# Patient Record
Sex: Female | Born: 1935 | Race: Black or African American | Hispanic: No | Smoking: Never smoker
Health system: Southern US, Community
[De-identification: ages and names within clinical notes are randomized; demographics above are authoritative.]

## PROBLEM LIST (undated history)

## (undated) DIAGNOSIS — I10 Essential (primary) hypertension: Secondary | ICD-10-CM

## (undated) DIAGNOSIS — G4733 Obstructive sleep apnea (adult) (pediatric): Secondary | ICD-10-CM

## (undated) DIAGNOSIS — J411 Mucopurulent chronic bronchitis: Secondary | ICD-10-CM

## (undated) DIAGNOSIS — M8668 Other chronic osteomyelitis, other site: Secondary | ICD-10-CM

## (undated) DIAGNOSIS — D649 Anemia, unspecified: Secondary | ICD-10-CM

## (undated) DIAGNOSIS — F32A Depression, unspecified: Secondary | ICD-10-CM

## (undated) DIAGNOSIS — M545 Low back pain, unspecified: Secondary | ICD-10-CM

## (undated) DIAGNOSIS — F329 Major depressive disorder, single episode, unspecified: Secondary | ICD-10-CM

## (undated) DIAGNOSIS — I499 Cardiac arrhythmia, unspecified: Secondary | ICD-10-CM

## (undated) DIAGNOSIS — E041 Nontoxic single thyroid nodule: Secondary | ICD-10-CM

## (undated) DIAGNOSIS — Z86010 Personal history of colonic polyps: Secondary | ICD-10-CM

## (undated) DIAGNOSIS — Z8719 Personal history of other diseases of the digestive system: Secondary | ICD-10-CM

## (undated) DIAGNOSIS — R002 Palpitations: Secondary | ICD-10-CM

## (undated) DIAGNOSIS — K449 Diaphragmatic hernia without obstruction or gangrene: Secondary | ICD-10-CM

## (undated) DIAGNOSIS — F419 Anxiety disorder, unspecified: Secondary | ICD-10-CM

## (undated) DIAGNOSIS — K5730 Diverticulosis of large intestine without perforation or abscess without bleeding: Secondary | ICD-10-CM

## (undated) DIAGNOSIS — R269 Unspecified abnormalities of gait and mobility: Secondary | ICD-10-CM

## (undated) DIAGNOSIS — E7800 Pure hypercholesterolemia, unspecified: Secondary | ICD-10-CM

## (undated) DIAGNOSIS — I2510 Atherosclerotic heart disease of native coronary artery without angina pectoris: Secondary | ICD-10-CM

## (undated) DIAGNOSIS — M1990 Unspecified osteoarthritis, unspecified site: Secondary | ICD-10-CM

## (undated) DIAGNOSIS — K219 Gastro-esophageal reflux disease without esophagitis: Secondary | ICD-10-CM

## (undated) HISTORY — DX: Anemia, unspecified: D64.9

## (undated) HISTORY — PX: CHOLECYSTECTOMY: SHX55

## (undated) HISTORY — DX: Gastro-esophageal reflux disease without esophagitis: K21.9

## (undated) HISTORY — DX: Unspecified osteoarthritis, unspecified site: M19.90

## (undated) HISTORY — DX: Diaphragmatic hernia without obstruction or gangrene: K44.9

## (undated) HISTORY — PX: CATARACT EXTRACTION: SUR2

## (undated) HISTORY — DX: Personal history of colonic polyps: Z86.010

## (undated) HISTORY — DX: Palpitations: R00.2

## (undated) HISTORY — PX: ABDOMINAL HYSTERECTOMY: SHX81

## (undated) HISTORY — DX: Atherosclerotic heart disease of native coronary artery without angina pectoris: I25.10

## (undated) HISTORY — DX: Nontoxic single thyroid nodule: E04.1

## (undated) HISTORY — DX: Obstructive sleep apnea (adult) (pediatric): G47.33

## (undated) HISTORY — DX: Low back pain: M54.5

## (undated) HISTORY — DX: Diverticulosis of large intestine without perforation or abscess without bleeding: K57.30

## (undated) HISTORY — DX: Pure hypercholesterolemia, unspecified: E78.00

## (undated) HISTORY — DX: Unspecified abnormalities of gait and mobility: R26.9

## (undated) HISTORY — DX: Mucopurulent chronic bronchitis: J41.1

## (undated) HISTORY — DX: Essential (primary) hypertension: I10

## (undated) HISTORY — DX: Other chronic osteomyelitis, other site: M86.68

## (undated) HISTORY — PX: TOTAL KNEE ARTHROPLASTY: SHX125

## (undated) HISTORY — DX: Low back pain, unspecified: M54.50

## (undated) HISTORY — DX: Personal history of other diseases of the digestive system: Z87.19

## (undated) HISTORY — DX: Anxiety disorder, unspecified: F41.9

## (undated) HISTORY — PX: APPENDECTOMY: SHX54

---

## 1998-03-20 ENCOUNTER — Ambulatory Visit (HOSPITAL_COMMUNITY): Admission: RE | Admit: 1998-03-20 | Discharge: 1998-03-20 | Payer: Self-pay | Admitting: Obstetrics & Gynecology

## 1998-03-20 ENCOUNTER — Encounter: Payer: Self-pay | Admitting: Obstetrics & Gynecology

## 1998-09-27 ENCOUNTER — Encounter: Payer: Self-pay | Admitting: Pulmonary Disease

## 1998-09-27 ENCOUNTER — Ambulatory Visit (HOSPITAL_COMMUNITY): Admission: RE | Admit: 1998-09-27 | Discharge: 1998-09-27 | Payer: Self-pay | Admitting: Pulmonary Disease

## 1998-12-20 ENCOUNTER — Ambulatory Visit (HOSPITAL_COMMUNITY): Admission: RE | Admit: 1998-12-20 | Discharge: 1998-12-20 | Payer: Self-pay | Admitting: Gastroenterology

## 1998-12-20 ENCOUNTER — Encounter: Payer: Self-pay | Admitting: Gastroenterology

## 1999-01-05 ENCOUNTER — Ambulatory Visit (HOSPITAL_COMMUNITY): Admission: RE | Admit: 1999-01-05 | Discharge: 1999-01-05 | Payer: Self-pay

## 1999-01-23 ENCOUNTER — Encounter (INDEPENDENT_AMBULATORY_CARE_PROVIDER_SITE_OTHER): Payer: Self-pay

## 1999-01-23 ENCOUNTER — Observation Stay (HOSPITAL_COMMUNITY): Admission: RE | Admit: 1999-01-23 | Discharge: 1999-01-24 | Payer: Self-pay

## 1999-03-20 ENCOUNTER — Ambulatory Visit (HOSPITAL_COMMUNITY): Admission: RE | Admit: 1999-03-20 | Discharge: 1999-03-20 | Payer: Self-pay

## 2000-03-24 ENCOUNTER — Ambulatory Visit (HOSPITAL_COMMUNITY): Admission: RE | Admit: 2000-03-24 | Discharge: 2000-03-24 | Payer: Self-pay | Admitting: Obstetrics & Gynecology

## 2000-04-18 ENCOUNTER — Other Ambulatory Visit: Admission: RE | Admit: 2000-04-18 | Discharge: 2000-04-18 | Payer: Self-pay | Admitting: Obstetrics & Gynecology

## 2000-07-23 ENCOUNTER — Encounter: Payer: Self-pay | Admitting: Podiatry

## 2000-07-23 ENCOUNTER — Encounter: Admission: RE | Admit: 2000-07-23 | Discharge: 2000-07-23 | Payer: Self-pay | Admitting: Podiatry

## 2000-07-25 ENCOUNTER — Ambulatory Visit (HOSPITAL_BASED_OUTPATIENT_CLINIC_OR_DEPARTMENT_OTHER): Admission: RE | Admit: 2000-07-25 | Discharge: 2000-07-25 | Payer: Self-pay | Admitting: Podiatry

## 2001-02-23 ENCOUNTER — Encounter: Payer: Self-pay | Admitting: *Deleted

## 2001-02-24 ENCOUNTER — Inpatient Hospital Stay (HOSPITAL_COMMUNITY): Admission: RE | Admit: 2001-02-24 | Discharge: 2001-02-26 | Payer: Self-pay | Admitting: *Deleted

## 2001-02-26 ENCOUNTER — Encounter: Payer: Self-pay | Admitting: Physical Medicine & Rehabilitation

## 2001-02-26 ENCOUNTER — Inpatient Hospital Stay (HOSPITAL_COMMUNITY)
Admission: RE | Admit: 2001-02-26 | Discharge: 2001-03-04 | Payer: Self-pay | Admitting: Physical Medicine & Rehabilitation

## 2001-03-27 ENCOUNTER — Encounter: Admission: RE | Admit: 2001-03-27 | Discharge: 2001-06-25 | Payer: Self-pay | Admitting: *Deleted

## 2001-07-08 ENCOUNTER — Encounter: Payer: Self-pay | Admitting: Gastroenterology

## 2001-08-24 ENCOUNTER — Encounter: Payer: Self-pay | Admitting: Obstetrics & Gynecology

## 2001-08-24 ENCOUNTER — Ambulatory Visit (HOSPITAL_COMMUNITY): Admission: RE | Admit: 2001-08-24 | Discharge: 2001-08-24 | Payer: Self-pay | Admitting: Obstetrics & Gynecology

## 2002-04-30 ENCOUNTER — Encounter: Admission: RE | Admit: 2002-04-30 | Discharge: 2002-04-30 | Payer: Self-pay | Admitting: *Deleted

## 2002-04-30 ENCOUNTER — Encounter: Payer: Self-pay | Admitting: *Deleted

## 2002-05-14 ENCOUNTER — Encounter: Payer: Self-pay | Admitting: *Deleted

## 2002-05-14 ENCOUNTER — Encounter: Admission: RE | Admit: 2002-05-14 | Discharge: 2002-05-14 | Payer: Self-pay | Admitting: *Deleted

## 2002-07-09 ENCOUNTER — Encounter: Payer: Self-pay | Admitting: Cardiology

## 2002-07-09 ENCOUNTER — Ambulatory Visit (HOSPITAL_COMMUNITY): Admission: RE | Admit: 2002-07-09 | Discharge: 2002-07-09 | Payer: Self-pay | Admitting: Cardiology

## 2002-09-23 ENCOUNTER — Inpatient Hospital Stay (HOSPITAL_COMMUNITY): Admission: EM | Admit: 2002-09-23 | Discharge: 2002-09-25 | Payer: Self-pay | Admitting: Emergency Medicine

## 2002-09-23 ENCOUNTER — Encounter: Payer: Self-pay | Admitting: *Deleted

## 2002-10-21 ENCOUNTER — Encounter: Admission: RE | Admit: 2002-10-21 | Discharge: 2003-01-19 | Payer: Self-pay | Admitting: Neurology

## 2003-01-05 ENCOUNTER — Ambulatory Visit (HOSPITAL_BASED_OUTPATIENT_CLINIC_OR_DEPARTMENT_OTHER): Admission: RE | Admit: 2003-01-05 | Discharge: 2003-01-05 | Payer: Self-pay | Admitting: Pulmonary Disease

## 2003-04-05 ENCOUNTER — Ambulatory Visit (HOSPITAL_COMMUNITY): Admission: RE | Admit: 2003-04-05 | Discharge: 2003-04-05 | Payer: Self-pay | Admitting: Cardiovascular Disease

## 2003-04-12 ENCOUNTER — Ambulatory Visit (HOSPITAL_COMMUNITY): Admission: RE | Admit: 2003-04-12 | Discharge: 2003-04-12 | Payer: Self-pay | Admitting: Pulmonary Disease

## 2003-04-12 ENCOUNTER — Encounter: Payer: Self-pay | Admitting: Pulmonary Disease

## 2003-04-22 ENCOUNTER — Ambulatory Visit (HOSPITAL_BASED_OUTPATIENT_CLINIC_OR_DEPARTMENT_OTHER): Admission: RE | Admit: 2003-04-22 | Discharge: 2003-04-22 | Payer: Self-pay | Admitting: Pulmonary Disease

## 2003-05-13 ENCOUNTER — Encounter (INDEPENDENT_AMBULATORY_CARE_PROVIDER_SITE_OTHER): Payer: Self-pay | Admitting: *Deleted

## 2003-05-13 ENCOUNTER — Encounter: Payer: Self-pay | Admitting: Endocrinology

## 2003-05-13 ENCOUNTER — Ambulatory Visit (HOSPITAL_COMMUNITY): Admission: RE | Admit: 2003-05-13 | Discharge: 2003-05-13 | Payer: Self-pay | Admitting: Endocrinology

## 2003-07-19 ENCOUNTER — Inpatient Hospital Stay (HOSPITAL_COMMUNITY): Admission: RE | Admit: 2003-07-19 | Discharge: 2003-07-22 | Payer: Self-pay | Admitting: Orthopedic Surgery

## 2003-07-22 ENCOUNTER — Inpatient Hospital Stay (HOSPITAL_COMMUNITY)
Admission: RE | Admit: 2003-07-22 | Discharge: 2003-07-29 | Payer: Self-pay | Admitting: Physical Medicine & Rehabilitation

## 2003-09-02 ENCOUNTER — Encounter: Admission: RE | Admit: 2003-09-02 | Discharge: 2003-10-28 | Payer: Self-pay | Admitting: Orthopedic Surgery

## 2003-10-24 ENCOUNTER — Other Ambulatory Visit: Admission: RE | Admit: 2003-10-24 | Discharge: 2003-10-24 | Payer: Self-pay | Admitting: Obstetrics and Gynecology

## 2003-12-13 ENCOUNTER — Ambulatory Visit (HOSPITAL_COMMUNITY): Admission: RE | Admit: 2003-12-13 | Discharge: 2003-12-13 | Payer: Self-pay | Admitting: Pulmonary Disease

## 2003-12-27 ENCOUNTER — Inpatient Hospital Stay (HOSPITAL_COMMUNITY): Admission: EM | Admit: 2003-12-27 | Discharge: 2003-12-29 | Payer: Self-pay | Admitting: Emergency Medicine

## 2003-12-28 ENCOUNTER — Encounter (INDEPENDENT_AMBULATORY_CARE_PROVIDER_SITE_OTHER): Payer: Self-pay | Admitting: Specialist

## 2003-12-28 ENCOUNTER — Encounter: Payer: Self-pay | Admitting: Internal Medicine

## 2004-07-18 ENCOUNTER — Ambulatory Visit: Payer: Self-pay | Admitting: Pulmonary Disease

## 2004-07-20 ENCOUNTER — Ambulatory Visit: Payer: Self-pay | Admitting: Pulmonary Disease

## 2004-08-22 ENCOUNTER — Ambulatory Visit: Payer: Self-pay

## 2004-08-27 ENCOUNTER — Ambulatory Visit: Payer: Self-pay | Admitting: Cardiovascular Disease

## 2004-08-30 ENCOUNTER — Ambulatory Visit: Payer: Self-pay | Admitting: Cardiology

## 2004-08-30 ENCOUNTER — Inpatient Hospital Stay (HOSPITAL_BASED_OUTPATIENT_CLINIC_OR_DEPARTMENT_OTHER): Admission: RE | Admit: 2004-08-30 | Discharge: 2004-08-30 | Payer: Self-pay | Admitting: Cardiology

## 2004-08-31 ENCOUNTER — Ambulatory Visit: Payer: Self-pay

## 2004-09-03 ENCOUNTER — Ambulatory Visit: Payer: Self-pay

## 2004-09-20 ENCOUNTER — Ambulatory Visit: Payer: Self-pay | Admitting: Cardiology

## 2004-09-25 ENCOUNTER — Ambulatory Visit: Payer: Self-pay | Admitting: Internal Medicine

## 2004-09-25 ENCOUNTER — Inpatient Hospital Stay (HOSPITAL_COMMUNITY): Admission: EM | Admit: 2004-09-25 | Discharge: 2004-09-30 | Payer: Self-pay | Admitting: Emergency Medicine

## 2004-09-25 ENCOUNTER — Ambulatory Visit: Payer: Self-pay | Admitting: Pulmonary Disease

## 2004-10-05 ENCOUNTER — Ambulatory Visit: Payer: Self-pay | Admitting: Pulmonary Disease

## 2004-10-23 ENCOUNTER — Ambulatory Visit: Payer: Self-pay | Admitting: Gastroenterology

## 2004-11-02 ENCOUNTER — Ambulatory Visit: Payer: Self-pay | Admitting: Cardiology

## 2004-11-05 ENCOUNTER — Ambulatory Visit: Payer: Self-pay | Admitting: Pulmonary Disease

## 2004-11-06 ENCOUNTER — Ambulatory Visit: Payer: Self-pay | Admitting: Pulmonary Disease

## 2005-01-01 ENCOUNTER — Ambulatory Visit (HOSPITAL_COMMUNITY): Admission: RE | Admit: 2005-01-01 | Discharge: 2005-01-01 | Payer: Self-pay | Admitting: Podiatry

## 2005-02-01 ENCOUNTER — Ambulatory Visit: Payer: Self-pay | Admitting: Pulmonary Disease

## 2005-02-06 ENCOUNTER — Ambulatory Visit: Payer: Self-pay | Admitting: Pulmonary Disease

## 2005-03-11 ENCOUNTER — Ambulatory Visit: Payer: Self-pay | Admitting: Physician Assistant

## 2005-03-11 ENCOUNTER — Ambulatory Visit: Payer: Self-pay

## 2005-04-22 ENCOUNTER — Ambulatory Visit: Payer: Self-pay | Admitting: Pulmonary Disease

## 2005-05-21 ENCOUNTER — Ambulatory Visit: Payer: Self-pay | Admitting: Cardiovascular Disease

## 2005-08-20 ENCOUNTER — Ambulatory Visit: Payer: Self-pay | Admitting: Pulmonary Disease

## 2005-09-03 ENCOUNTER — Ambulatory Visit: Payer: Self-pay | Admitting: Gastroenterology

## 2005-11-20 ENCOUNTER — Ambulatory Visit: Payer: Self-pay | Admitting: Pulmonary Disease

## 2005-11-27 ENCOUNTER — Ambulatory Visit: Payer: Self-pay | Admitting: Pulmonary Disease

## 2006-03-19 ENCOUNTER — Ambulatory Visit: Payer: Self-pay | Admitting: Pulmonary Disease

## 2006-04-11 ENCOUNTER — Ambulatory Visit: Payer: Self-pay | Admitting: Cardiovascular Disease

## 2006-04-21 ENCOUNTER — Ambulatory Visit: Payer: Self-pay

## 2006-09-08 ENCOUNTER — Ambulatory Visit: Payer: Self-pay | Admitting: Pulmonary Disease

## 2007-03-10 ENCOUNTER — Ambulatory Visit: Payer: Self-pay | Admitting: Pulmonary Disease

## 2007-03-10 LAB — CONVERTED CEMR LAB
Albumin: 3.6 g/dL (ref 3.5–5.2)
BUN: 12 mg/dL (ref 6–23)
Bilirubin, Direct: 0.3 mg/dL (ref 0.0–0.3)
Calcium: 9.7 mg/dL (ref 8.4–10.5)
Eosinophils Absolute: 0.1 10*3/uL (ref 0.0–0.6)
GFR calc Af Amer: 78 mL/min
GFR calc non Af Amer: 65 mL/min
Glucose, Bld: 106 mg/dL — ABNORMAL HIGH (ref 70–99)
HDL: 64.8 mg/dL (ref >39.0)
LDL Cholesterol: 94 mg/dL (ref 0–99)
Lymphocytes Relative: 20.8 % (ref 12.0–46.0)
MCV: 89.9 fL (ref 78.0–100.0)
Monocytes Relative: 8 % (ref 3.0–11.0)
Neutro Abs: 3.9 10*3/uL (ref 1.4–7.7)
Platelets: 219 10*3/uL (ref 150–400)
TSH: 0.87 microintl units/mL (ref 0.35–5.50)
VLDL: 15 mg/dL (ref 0–40)

## 2007-05-01 ENCOUNTER — Ambulatory Visit: Payer: Self-pay | Admitting: Cardiovascular Disease

## 2007-09-01 DIAGNOSIS — I2510 Atherosclerotic heart disease of native coronary artery without angina pectoris: Secondary | ICD-10-CM | POA: Insufficient documentation

## 2007-09-01 DIAGNOSIS — E785 Hyperlipidemia, unspecified: Secondary | ICD-10-CM | POA: Insufficient documentation

## 2007-09-01 DIAGNOSIS — G4733 Obstructive sleep apnea (adult) (pediatric): Secondary | ICD-10-CM | POA: Insufficient documentation

## 2007-09-01 DIAGNOSIS — I10 Essential (primary) hypertension: Secondary | ICD-10-CM

## 2007-09-01 DIAGNOSIS — D649 Anemia, unspecified: Secondary | ICD-10-CM | POA: Insufficient documentation

## 2007-09-01 DIAGNOSIS — K449 Diaphragmatic hernia without obstruction or gangrene: Secondary | ICD-10-CM | POA: Insufficient documentation

## 2007-09-02 ENCOUNTER — Ambulatory Visit: Payer: Self-pay | Admitting: Pulmonary Disease

## 2007-09-02 DIAGNOSIS — K5730 Diverticulosis of large intestine without perforation or abscess without bleeding: Secondary | ICD-10-CM

## 2007-09-02 DIAGNOSIS — M1990 Unspecified osteoarthritis, unspecified site: Secondary | ICD-10-CM | POA: Insufficient documentation

## 2007-09-02 DIAGNOSIS — R269 Unspecified abnormalities of gait and mobility: Secondary | ICD-10-CM | POA: Insufficient documentation

## 2007-09-02 DIAGNOSIS — E041 Nontoxic single thyroid nodule: Secondary | ICD-10-CM | POA: Insufficient documentation

## 2007-09-02 DIAGNOSIS — J42 Unspecified chronic bronchitis: Secondary | ICD-10-CM

## 2007-09-02 DIAGNOSIS — M5442 Lumbago with sciatica, left side: Secondary | ICD-10-CM

## 2007-09-14 ENCOUNTER — Telehealth: Payer: Self-pay | Admitting: Pulmonary Disease

## 2007-10-21 ENCOUNTER — Ambulatory Visit: Payer: Self-pay | Admitting: Gastroenterology

## 2007-11-25 ENCOUNTER — Encounter: Payer: Self-pay | Admitting: Infectious Diseases

## 2007-11-26 ENCOUNTER — Encounter: Payer: Self-pay | Admitting: Infectious Diseases

## 2007-12-09 ENCOUNTER — Encounter: Payer: Self-pay | Admitting: Infectious Diseases

## 2007-12-11 ENCOUNTER — Ambulatory Visit: Payer: Self-pay | Admitting: Infectious Diseases

## 2007-12-11 DIAGNOSIS — M8668 Other chronic osteomyelitis, other site: Secondary | ICD-10-CM

## 2008-02-09 ENCOUNTER — Inpatient Hospital Stay (HOSPITAL_COMMUNITY): Admission: EM | Admit: 2008-02-09 | Discharge: 2008-02-11 | Payer: Self-pay | Admitting: Emergency Medicine

## 2008-02-09 ENCOUNTER — Telehealth: Payer: Self-pay | Admitting: Gastroenterology

## 2008-02-11 ENCOUNTER — Ambulatory Visit: Payer: Self-pay | Admitting: Gastroenterology

## 2008-02-11 ENCOUNTER — Encounter: Payer: Self-pay | Admitting: Gastroenterology

## 2008-02-12 ENCOUNTER — Encounter: Payer: Self-pay | Admitting: Gastroenterology

## 2008-02-25 ENCOUNTER — Ambulatory Visit: Payer: Self-pay | Admitting: Infectious Diseases

## 2008-03-01 ENCOUNTER — Ambulatory Visit: Payer: Self-pay | Admitting: Pulmonary Disease

## 2008-03-01 DIAGNOSIS — F411 Generalized anxiety disorder: Secondary | ICD-10-CM | POA: Insufficient documentation

## 2008-03-03 LAB — CONVERTED CEMR LAB
ALT: 15 units/L (ref 0–35)
AST: 23 units/L (ref 0–37)
Albumin: 3.7 g/dL (ref 3.5–5.2)
Alkaline Phosphatase: 62 units/L (ref 39–117)
BUN: 11 mg/dL (ref 6–23)
Basophils Relative: 0.8 % (ref 0.0–3.0)
CO2: 34 meq/L — ABNORMAL HIGH (ref 19–32)
Chloride: 103 meq/L (ref 96–112)
Creatinine, Ser: 1.1 mg/dL (ref 0.4–1.2)
Eosinophils Relative: 0.9 % (ref 0.0–5.0)
GFR calc non Af Amer: 51 mL/min
Hemoglobin: 12.7 g/dL (ref 12.0–15.0)
LDL Cholesterol: 101 mg/dL — ABNORMAL HIGH (ref 0–99)
Lymphocytes Relative: 21.9 % (ref 12.0–46.0)
Monocytes Relative: 12.3 % — ABNORMAL HIGH (ref 3.0–12.0)
Neutro Abs: 3.7 10*3/uL (ref 1.4–7.7)
Potassium: 3.3 meq/L — ABNORMAL LOW (ref 3.5–5.1)
RBC: 4.13 M/uL (ref 3.87–5.11)
RDW: 13.1 % (ref 11.5–14.6)
Sed Rate: 14 mm/hr (ref 0–22)
Sodium: 142 meq/L (ref 135–145)
Total Bilirubin: 1.2 mg/dL (ref 0.3–1.2)
Total CHOL/HDL Ratio: 3
WBC: 5.7 10*3/uL (ref 4.5–10.5)

## 2008-03-09 DIAGNOSIS — Z8719 Personal history of other diseases of the digestive system: Secondary | ICD-10-CM | POA: Insufficient documentation

## 2008-03-09 DIAGNOSIS — H409 Unspecified glaucoma: Secondary | ICD-10-CM | POA: Insufficient documentation

## 2008-03-09 DIAGNOSIS — Z860100 Personal history of colon polyps, unspecified: Secondary | ICD-10-CM | POA: Insufficient documentation

## 2008-03-10 ENCOUNTER — Ambulatory Visit: Payer: Self-pay | Admitting: Gastroenterology

## 2008-03-10 DIAGNOSIS — K219 Gastro-esophageal reflux disease without esophagitis: Secondary | ICD-10-CM

## 2008-04-20 ENCOUNTER — Ambulatory Visit: Payer: Self-pay | Admitting: Cardiovascular Disease

## 2008-05-23 ENCOUNTER — Encounter: Payer: Self-pay | Admitting: Pulmonary Disease

## 2008-06-07 ENCOUNTER — Ambulatory Visit: Payer: Self-pay | Admitting: Infectious Diseases

## 2008-08-08 ENCOUNTER — Encounter: Payer: Self-pay | Admitting: Gastroenterology

## 2008-08-29 ENCOUNTER — Ambulatory Visit: Payer: Self-pay | Admitting: Pulmonary Disease

## 2008-08-29 DIAGNOSIS — E559 Vitamin D deficiency, unspecified: Secondary | ICD-10-CM

## 2008-10-28 ENCOUNTER — Encounter (INDEPENDENT_AMBULATORY_CARE_PROVIDER_SITE_OTHER): Payer: Self-pay | Admitting: *Deleted

## 2008-11-08 ENCOUNTER — Ambulatory Visit: Payer: Self-pay | Admitting: Infectious Diseases

## 2009-02-27 ENCOUNTER — Ambulatory Visit: Payer: Self-pay | Admitting: Pulmonary Disease

## 2009-02-27 ENCOUNTER — Encounter: Payer: Self-pay | Admitting: Adult Health

## 2009-02-27 DIAGNOSIS — H9190 Unspecified hearing loss, unspecified ear: Secondary | ICD-10-CM | POA: Insufficient documentation

## 2009-03-17 LAB — CONVERTED CEMR LAB: Vit D, 25-Hydroxy: 33 ng/mL (ref 30–89)

## 2009-03-27 ENCOUNTER — Encounter: Payer: Self-pay | Admitting: Pulmonary Disease

## 2009-04-14 ENCOUNTER — Ambulatory Visit: Payer: Self-pay | Admitting: Cardiovascular Disease

## 2009-04-14 DIAGNOSIS — R002 Palpitations: Secondary | ICD-10-CM | POA: Insufficient documentation

## 2009-04-18 ENCOUNTER — Ambulatory Visit: Payer: Self-pay | Admitting: Cardiovascular Disease

## 2009-04-20 ENCOUNTER — Telehealth: Payer: Self-pay | Admitting: Cardiovascular Disease

## 2009-05-23 ENCOUNTER — Telehealth (INDEPENDENT_AMBULATORY_CARE_PROVIDER_SITE_OTHER): Payer: Self-pay | Admitting: *Deleted

## 2009-05-28 LAB — CONVERTED CEMR LAB
Alkaline Phosphatase: 62 units/L (ref 39–117)
BUN: 19 mg/dL (ref 6–23)
Basophils Absolute: 0 10*3/uL (ref 0.0–0.1)
Bilirubin, Direct: 0.2 mg/dL (ref 0.0–0.3)
CO2: 31 meq/L (ref 19–32)
Calcium: 9.8 mg/dL (ref 8.4–10.5)
Chloride: 103 meq/L (ref 96–112)
Cholesterol: 174 mg/dL (ref 0–200)
Creatinine, Ser: 1.1 mg/dL (ref 0.4–1.2)
Eosinophils Absolute: 0.1 10*3/uL (ref 0.0–0.7)
HDL: 56.7 mg/dL (ref >39.00)
Lymphocytes Relative: 22.3 % (ref 12.0–46.0)
MCHC: 33.1 g/dL (ref 30.0–36.0)
MCV: 88.4 fL (ref 78.0–100.0)
Monocytes Absolute: 0.4 10*3/uL (ref 0.1–1.0)
Neutrophils Relative %: 69.5 % (ref 43.0–77.0)
Platelets: 218 10*3/uL (ref 150.0–400.0)
RBC: 4.51 M/uL (ref 3.87–5.11)
RDW: 14 % (ref 11.5–14.6)
Total Bilirubin: 1.6 mg/dL — ABNORMAL HIGH (ref 0.3–1.2)
Total CHOL/HDL Ratio: 3
Total Protein: 7.4 g/dL (ref 6.0–8.3)
Triglycerides: 94 mg/dL (ref 0.0–149.0)

## 2009-06-06 ENCOUNTER — Encounter: Payer: Self-pay | Admitting: Adult Health

## 2009-06-06 ENCOUNTER — Encounter: Payer: Self-pay | Admitting: Cardiovascular Disease

## 2009-06-06 ENCOUNTER — Ambulatory Visit: Payer: Self-pay | Admitting: Pulmonary Disease

## 2009-06-20 ENCOUNTER — Ambulatory Visit: Payer: Self-pay | Admitting: Cardiovascular Disease

## 2009-08-29 ENCOUNTER — Ambulatory Visit: Payer: Self-pay | Admitting: Pulmonary Disease

## 2009-09-01 LAB — CONVERTED CEMR LAB
AST: 32 units/L (ref 0–37)
Albumin: 3.9 g/dL (ref 3.5–5.2)
Alkaline Phosphatase: 70 units/L (ref 39–117)
Basophils Absolute: 0.1 10*3/uL (ref 0.0–0.1)
Bilirubin, Direct: 0.1 mg/dL (ref 0.0–0.3)
CO2: 31 meq/L (ref 19–32)
Calcium: 10.1 mg/dL (ref 8.4–10.5)
Chloride: 103 meq/L (ref 96–112)
Eosinophils Absolute: 0.1 10*3/uL (ref 0.0–0.7)
Glucose, Bld: 90 mg/dL (ref 70–99)
HCT: 44 % (ref 36.0–46.0)
Hemoglobin: 13.9 g/dL (ref 12.0–15.0)
Lymphocytes Relative: 26.9 % (ref 12.0–46.0)
Lymphs Abs: 1.9 10*3/uL (ref 0.7–4.0)
MCHC: 31.6 g/dL (ref 30.0–36.0)
Neutro Abs: 4 10*3/uL (ref 1.4–7.7)
Platelets: 257 10*3/uL (ref 150.0–400.0)
Potassium: 3.1 meq/L — ABNORMAL LOW (ref 3.5–5.1)
RDW: 14.3 % (ref 11.5–14.6)
Sodium: 141 meq/L (ref 135–145)
TSH: 1.06 microintl units/mL (ref 0.35–5.50)
Total Protein: 7.8 g/dL (ref 6.0–8.3)

## 2009-10-24 ENCOUNTER — Telehealth (INDEPENDENT_AMBULATORY_CARE_PROVIDER_SITE_OTHER): Payer: Self-pay | Admitting: *Deleted

## 2010-02-09 ENCOUNTER — Ambulatory Visit: Payer: Self-pay | Admitting: Pulmonary Disease

## 2010-02-16 ENCOUNTER — Ambulatory Visit: Payer: Self-pay | Admitting: Pulmonary Disease

## 2010-02-20 ENCOUNTER — Telehealth: Payer: Self-pay | Admitting: Pulmonary Disease

## 2010-02-20 LAB — CONVERTED CEMR LAB
Calcium: 9.7 mg/dL (ref 8.4–10.5)
Creatinine, Ser: 0.9 mg/dL (ref 0.4–1.2)
GFR calc non Af Amer: 77.42 mL/min (ref >60)
HDL: 57.5 mg/dL (ref >39.00)
LDL Cholesterol: 91 mg/dL (ref 0–99)
Sodium: 142 meq/L (ref 135–145)
Total CHOL/HDL Ratio: 3
Triglycerides: 90 mg/dL (ref 0.0–149.0)

## 2010-02-21 ENCOUNTER — Telehealth (INDEPENDENT_AMBULATORY_CARE_PROVIDER_SITE_OTHER): Payer: Self-pay | Admitting: *Deleted

## 2010-05-23 ENCOUNTER — Telehealth: Payer: Self-pay | Admitting: Pulmonary Disease

## 2010-07-02 ENCOUNTER — Ambulatory Visit: Payer: Self-pay | Admitting: Gastroenterology

## 2010-07-03 ENCOUNTER — Telehealth: Payer: Self-pay | Admitting: Gastroenterology

## 2010-07-09 ENCOUNTER — Telehealth: Payer: Self-pay | Admitting: Gastroenterology

## 2010-08-16 ENCOUNTER — Encounter: Payer: Self-pay | Admitting: Pulmonary Disease

## 2010-08-16 ENCOUNTER — Other Ambulatory Visit: Payer: Self-pay | Admitting: Pulmonary Disease

## 2010-08-16 ENCOUNTER — Ambulatory Visit
Admission: RE | Admit: 2010-08-16 | Discharge: 2010-08-16 | Payer: Self-pay | Source: Home / Self Care | Attending: Pulmonary Disease | Admitting: Pulmonary Disease

## 2010-08-16 LAB — HEPATIC FUNCTION PANEL
ALT: 16 U/L (ref 0–35)
AST: 29 U/L (ref 0–37)
Albumin: 3.8 g/dL (ref 3.5–5.2)
Alkaline Phosphatase: 60 U/L (ref 39–117)
Bilirubin, Direct: 0.2 mg/dL (ref 0.0–0.3)
Total Bilirubin: 1.2 mg/dL (ref 0.3–1.2)
Total Protein: 7.1 g/dL (ref 6.0–8.3)

## 2010-08-16 LAB — BASIC METABOLIC PANEL
BUN: 14 mg/dL (ref 6–23)
CO2: 34 mEq/L — ABNORMAL HIGH (ref 19–32)
Calcium: 10.1 mg/dL (ref 8.4–10.5)
Chloride: 103 mEq/L (ref 96–112)
Creatinine, Ser: 0.8 mg/dL (ref 0.4–1.2)
GFR: 88.58 mL/min (ref >60.00)
Glucose, Bld: 94 mg/dL (ref 70–99)
Potassium: 3.7 mEq/L (ref 3.5–5.1)
Sodium: 142 mEq/L (ref 135–145)

## 2010-08-16 LAB — CBC WITH DIFFERENTIAL/PLATELET
Basophils Absolute: 0 10*3/uL (ref 0.0–0.1)
Basophils Relative: 0.4 % (ref 0.0–3.0)
Eosinophils Absolute: 0.1 10*3/uL (ref 0.0–0.7)
Eosinophils Relative: 1 % (ref 0.0–5.0)
HCT: 41.4 % (ref 36.0–46.0)
Hemoglobin: 13.5 g/dL (ref 12.0–15.0)
Lymphocytes Relative: 28.1 % (ref 12.0–46.0)
Lymphs Abs: 1.8 10*3/uL (ref 0.7–4.0)
MCHC: 32.5 g/dL (ref 30.0–36.0)
MCV: 92.3 fl (ref 78.0–100.0)
Monocytes Absolute: 0.6 10*3/uL (ref 0.1–1.0)
Monocytes Relative: 9.7 % (ref 3.0–12.0)
Neutro Abs: 3.9 10*3/uL (ref 1.4–7.7)
Neutrophils Relative %: 60.8 % (ref 43.0–77.0)
Platelets: 231 10*3/uL (ref 150.0–400.0)
RBC: 4.49 Mil/uL (ref 3.87–5.11)
RDW: 14.6 % (ref 11.5–14.6)
WBC: 6.4 10*3/uL (ref 4.5–10.5)

## 2010-08-16 LAB — LIPID PANEL
Cholesterol: 158 mg/dL (ref 0–200)
HDL: 53.4 mg/dL (ref >39.00)
LDL Cholesterol: 86 mg/dL (ref 0–99)
Total CHOL/HDL Ratio: 3
Triglycerides: 91 mg/dL (ref 0.0–149.0)
VLDL: 18.2 mg/dL (ref 0.0–40.0)

## 2010-08-16 LAB — TSH: TSH: 1.53 u[IU]/mL (ref 0.35–5.50)

## 2010-08-18 LAB — CONVERTED CEMR LAB: Vit D, 25-Hydroxy: 26 ng/mL — ABNORMAL LOW (ref 30–89)

## 2010-09-04 NOTE — Procedures (Signed)
Summary: Colonoscopy   Colonoscopy  Procedure date:  07/08/2001  Findings:      Results: Polyp.  Results: Diverticulosis.       Location:  [facility].    Procedures Next Due Date:    Colonoscopy: 07/2004 Patient Name: [name], [name] MRN: [id number] Procedure Procedures: Colonoscopy CPT: 78469.    with Hot Biopsy(s)CPT: [id number].  Personnel: Endoscopist: Venita Lick. [name], MD, Clementeen Graham.  Exam Location: Exam performed in Outpatient Clinic. Outpatient  Patient Consent: Procedure, Alternatives, Risks and Benefits discussed, consent obtained, from patient.  Indications  Surveillance of: Adenomatous Polyp(s). Initial polypectomy was performed in 1993. in Aug. 1-2 Polyps were found at Index Exam. Prior polyp located in both proximal and distal colon. Pathology of worst  polyp: tubular adenoma.  History  Pre-Exam Physical: Performed Jul 08, 2001. Cardio-pulmonary exam, Rectal exam, HEENT exam , Abdominal exam, Extremity exam, Neurological exam, Mental status exam WNL.  Exam Exam: Extent of exam reached: Cecum, extent intended: Cecum.  The cecum was identified by appendiceal orifice and IC valve. Colon retroflexion performed. ASA Classification: II.  Monitoring: Pulse and BP monitoring, Oximetry used. Supplemental O2 given.  Colon Prep Used Golytely for colon prep. Prep results: good.  Sedation Meds: Patient assessed and found to be appropriate for moderate (conscious) sedation. Fentanyl Versed  Findings MULTIPLE POLYPS: Transverse Colon. minimum size 4 mm, maximum size 5 mm. Procedure:  hot biopsy, removed, Polyp retrieved, 2 polyps Polyps sent to pathology. ICD9: Colon Polyps: 211.3.  - DIVERTICULOSIS: Ascending Colon to Sigmoid Colon. Not bleeding.  NORMAL EXAM: Cecum.   Assessment  Diagnoses: 562.10: Diverticulosis.  211.3: Colon Polyps.   Events  Unplanned Interventions: No intervention was required.  Unplanned Events: There were no  complications. Plans  Post Exam Instructions: No aspirin or non-steroidal containing medications: 2 weeks.  Medication Plan: Await pathology. Continue current medications.  Patient Education: Patient given standard instructions for: Diverticulosis.  Disposition: After procedure patient sent to recovery. After recovery patient sent home.  Scheduling/Referral: Colonoscopy, to [facility] T. [name], MD, [facility], around Jul 08, 2004.  Primary Care Provider, to [facility]. Kriste Basque, MD,  This report was created from the original endoscopy report, which was reviewed and signed by the above listed endoscopist.    cc: Lonzo Cloud. Kriste Basque, MD

## 2010-09-04 NOTE — Assessment & Plan Note (Signed)
Summary: f/u 6 months///kp   Primary Care Provider:  Alroy Dust MD  CC:  6 month ROV & review mult medical problems....  History of Present Illness: 78 y/o BF here for a 6 month follow up visit...  she has multiple medical problems as noted below...   ~  she developed an osteomyelitis of the right mandible related to a tooth abcess and path showed bact colonies consistant w/ Actinomyces. (?any culture data?)...oral surg= DrRehm & seen by ID- DrFitzgerald and was treated w/ Clindamycin (due to Pen allergy)... notes reviewed... off meds now & improved.  ~  Jul09:  she was hosp briefly by GI for lower GI bleed- underwent EGD & Colon by DrStark (see below)... Nexium increased to Bid...   ~  February 27, 2009:  6 month ROV doing satis by her hx... saw DrFitzgerald for ID 4/10 & DrRehm for OralSurg- resolved osteomyelitis of maxilla after Clinda Rx-"they released me"... she notes decr hearing and we will refer to ENT for eval... also notes mild cough/ phlegm & we discussed Rx w/ Mucinex & Fluids...   ~  August 29, 2009:  her CC is "tired- no energy" & we discussed checking blood work... needs to incr exercise program/ mobility, take MVI/ VitD... she saw DrNishan 11/10 for f/u HBP, palpit, Lipids> event recorder showed PACs/ PVCs only & no med changes made... she has severe DJD & bilat TKRs> uses Mobic Prn... still under the care of DrPlovsky for psyche- & remains on Wellbutrin, Celexa, Lorazepam...    Current Problem List:              **NOTE: long hx of chronic non-compliance w/ Med Rx...  GLAUCOMA - on eye drops from DrShapiro...  HEARING LOSS - saw DrByers for hearing eval & they discussed hearing aides.  CHRONIC OSTEOMYELITIS OTHER SPECIFIED SITES (ICD-730.18) - SEE ABOVE--- she stopped the Clindamycin on her own... last seen by DrFitzgerald 4/10 (16mo off Clinda) and doing satis- he released her... still follows up w/ DrRehm for OralSurg...  OBSTRUCTIVE SLEEP APNEA (ICD-327.23) - mod OSA w/  sleep study 8/04 showing RDI=23, consult DrClance... Rx CPAP 10... states she is using CPAP regularly and tol well...  BRONCHITIS, RECURRENT (ICD-491.9) - no recent upper resp infection...  ~  CXR 1/11 showed clear lungs, WNL...  HYPERTENSION (ICD-401.9) - controlled on TOPROL XL 50mg /d, PLENDIL 5mg /d, HYZAAR 100/25 daily... BP=136/82 and asymptomatic- denies HA, fatigue, visual changes, CP, palipit, dizziness, syncope, dyspnea, edema, etc... she has a long hx of chr noncompliance w/ med Rx...  ~  labs 1/11 showed K= 3.1, BUN= 14, Creat= 1.0... rec> start K20 Bid...  CAD (ICD-414.00) - non-obstructive disease w/ cath 1/06 showing 30% focal stenosis in CIRC... NuclearStressTest 9/07 showed apical thinning, no ischemia, EF=69%... followed by Walker Kehr- last seen 11/10 for f/u of her HBP, palpit, Lipids> Event recoder showed benign PVCs & PACs, no change in med Rx.  HYPERCHOLESTEROLEMIA (ICD-272.0) - on LIPITOR 10mg /d...   ~  FLP 8/08 w/ TChol 174, TG 76, HDL 65, LDL 94  ~  FLP 7/09 showed TChol 173, TG 76, HDL 57, LDL 101  ~  FLP 7/10 showed TChol 174, TG 94, HDL 57, LDL 99  Hx of THYROID NODULE (ICD-241.0) - she has multinodular thyroid, no palp nodules, clinically euthyroid & doing well...   ~  labs 8/08 showed TSH = 0.87  ~  labs 7/09 showed TSH = 0.70  ~  labs 7/10 showed TSH= 1.12  ~  labs 1/11  showed TSH= 1.06  HIATAL HERNIA (ICD-553.3) - on NEXIUM 40mg /d... HH on prev CTscans... EGD in 1998 w/ distal esoph stricture dilated... no recurrent dysphagia etc...  ~  last EGD 7/09 by DrStark showed 4cmHH, GERD, gastritis & gastic polyps- neg HPylori... Rx w/ NEXIUM 40mg Bid...  DIVERTICULOSIS OF COLON (ICD-562.10) - colonoscopy 5/05 by DrStark w/ several sm 3-4 mm polyps, divertics, & f/u planned 64yrs... hosp in 2006 w/ divertic bleed that resolved spont... repeat hosp and f/u colonoscopy 7/09 showed divertics, and several 4-19mm adenomatous polyps...  DEGENERATIVE JOINT DISEASE (ICD-715.90) -  followed by DrDean... S/P Left TKR in 12/04 by drDean & Right TKR 7/02 by DrKrege... known CSpine and L/S spine disease... s/p recent epid steroid shot in lower back w/ improvement... we refilled her MOBIC 7.5mg  for as needed use.  BACK PAIN, LUMBAR (ICD-724.2)  VITAMIN D DEFICIENCY (ICD-268.9) - Vit D level Jul09 = 19 & started on Vit D 50K weekly...  ~  labs 7/10 showed Vit D level = 33... OK to switch to OTC Vit D 1000 u daily...  ABNORMALITY OF GAIT (ICD-781.2)  ANXIETY (ICD-300.00) - she is under the care of DrPlovsky for Psychiatry on BUPROPION XL 150mg - 2Qam,  CELEXA 40mg /d,  LORAZEPAM 0.5mg  as needed... she states her memory was poor and she couldn't focus & the Bupropion has helped...  ANEMIA (ICD-285.9) - after her diverticular bleed in 2006... on Fe therapy w/ resolution...  ~  labs 8/08 showed Hg= 12.8  ~  labs 7/10 showed Hg= 13.2  ~  labs 1/11 showed Hg= 13.9, Fe= 66 (16%sat)    Allergies: 1)  ! Penicillin 2)  ! Keflex 3)  Amoxicillin (Amoxicillin)  Comments:  Nurse/Medical Assistant: The patient's medications and allergies were reviewed with the patient and were updated in the Medication and Allergy Lists.  Past History:  Past Medical History:  GLAUCOMA (ICD-365.9) HEARING LOSS (ICD-389.9) CHRONIC OSTEOMYELITIS OTHER SPECIFIED SITES (ICD-730.18) OBSTRUCTIVE SLEEP APNEA (ICD-327.23) BRONCHITIS, RECURRENT (ICD-491.9) HYPERTENSION (ICD-401.9) CAD (ICD-414.00) PALPITATIONS (ICD-785.1) HYPERCHOLESTEROLEMIA (ICD-272.0) Hx of THYROID NODULE (ICD-241.0) HIATAL HERNIA (ICD-553.3) GERD (ICD-530.81) DIVERTICULOSIS OF COLON (ICD-562.10) DIVERTICULAR BLEEDING, HX OF (ICD-V12.79) COLONIC POLYPS, ADENOMATOUS, HX OF (ICD-V12.72) DEGENERATIVE JOINT DISEASE (ICD-715.90) BACK PAIN, LUMBAR (ICD-724.2) VITAMIN D DEFICIENCY (ICD-268.9) ABNORMALITY OF GAIT (ICD-781.2) ANXIETY (ICD-300.00) ANEMIA (ICD-285.9)  Past Surgical  History: Appendectomy Cholecystectomy Hysterectomy TOTAL KNEE REPLACEMENT, LEFT, HX OF (ICD-V43.65) TOTAL KNEE REPLACEMENT, RIGHT, HX OF (ICD-V43.65) CATARACT EXTRACTION, HX OF (ICD-V45.61)  Family History: Reviewed history from 03/01/2008 and no changes required. mother deceased age 4 from accident father unknown no siblings  Social History: Reviewed history from 03/10/2008 and no changes required. retired used to work as Barista alone.  no tob etoh widowed Daily Caffeine Use  Review of Systems      See HPI       The patient complains of decreased hearing, dyspnea on exertion, and difficulty walking.  The patient denies anorexia, fever, weight loss, weight gain, vision loss, hoarseness, chest pain, syncope, peripheral edema, prolonged cough, headaches, hemoptysis, abdominal pain, melena, hematochezia, severe indigestion/heartburn, hematuria, incontinence, muscle weakness, suspicious skin lesions, transient blindness, depression, unusual weight change, abnormal bleeding, enlarged lymph nodes, and angioedema.    Vital Signs:  Patient profile:   77 year old female Menstrual status:  postmenopausal Height:      65 inches Weight:      199 pounds O2 Sat:      92 % on Room air Temp:     97.4 degrees F oral Pulse  rate:   72 / minute BP sitting:   136 / 82  (left arm) Cuff size:   regular  Vitals Entered By: Randell Loop CMA (August 29, 2009 9:27 AM)  O2 Sat at Rest %:  92 O2 Flow:  Room air CC: 6 month ROV & review mult medical problems... Comments pt brought all of her meds today---meds updated today   Physical Exam  Additional Exam:  WD, Overweight, 77 y/o BF in NAD... wt= 199#  GENERAL:  Alert, pleasant & cooperative... I did not do formal MMSE... HEENT:  [place]/AT, EOM-wnl, PERRLA, EACs-clear, TMs-wnl, NOSE-clear, THROAT-clear & wnl, JAW- no pain or tenderness. NECK:  Supple w/ fairROM; no JVD; normal carotid impulses w/o bruits; no thyromegaly or nodules  palpated (cannot feel the multinod goiter), no lymphadenopathy. CHEST:  Clear to P & A; without wheezes/ rales/ or rhonchi heard... HEART:  Regular Rhythm; without murmurs/ rubs/ or gallops detected... ABDOMEN:  Soft & nontender; normal bowel sounds; no organomegaly or masses palpated... EXT:  s/p bilat TKRs, mod arthritic changes; no varicose veins/ +venous insuffic/ tr edema. NEURO:  CN's intact; no focal neuro deficits... DERM:  No lesions noted; no rash etc...     CXR  Procedure date:  08/29/2009  Findings:      CHEST - 2 VIEW   Comparison: Chest x-ray of 09/25/2004   Findings: The lungs are clear.  The heart is within normal limits in size.  Only mild degenerative change is noted throughout the thoracic spine.   IMPRESSION: No active lung disease.   Read By:  Juline Patch,  M.D.    MISC. Report  Procedure date:  08/29/2009  Findings:      BMP (METABOL)   Sodium                    141 mEq/L                   135-145   Potassium            [L]  3.1 mEq/L                   3.5-5.1   Chloride                  103 mEq/L                   96-112   Carbon Dioxide            31 mEq/L                    19-32   Glucose                   90 mg/dL                    67-61   BUN                       14 mg/dL                    9-50   Creatinine                1.0 mg/dL                   9.3-2.6   Calcium  10.1 mg/dL                  0.9-81.1   GFR                       68.64 mL/min                >60  Hepatic/Liver Function Panel (HEPATIC)   Total Bilirubin           1.0 mg/dL                   9.1-4.7   Direct Bilirubin          0.1 mg/dL                   8.2-9.5   Alkaline Phosphatase      70 U/L                      39-117   AST                       32 U/L                      0-37   ALT                       16 U/L                      0-35   Total Protein             7.8 g/dL                    6.2-1.3   Albumin                   3.9 g/dL                     0.8-6.5  CBC Platelet w/Diff (CBCD)   White Cell Count          6.9 K/uL                    4.5-10.5   Red Cell Count            4.88 Mil/uL                 3.87-5.11   Hemoglobin                13.9 g/dL                   78.4-69.6   Hematocrit                44.0 %                      36.0-46.0   MCV                       90.3 fl                     78.0-100.0   Platelet Count            257.0 K/uL                  150.0-400.0   Neutrophil %  58.9 %                      43.0-77.0   Lymphocyte %              26.9 %                      12.0-46.0   Monocyte %                11.1 %                      3.0-12.0   Eosinophils%              1.1 %                       0.0-5.0   Basophils %               2.0 %                       0.0-3.0  Comments:      TSH (TSH)   FastTSH                   1.06 uIU/mL                 0.35-5.50   IBC Panel (IBC)   Iron                      66 ug/dL                    29-562   Transferrin               299.5 mg/dL                 130.8-657.8   Iron Saturation      [L]  15.7 %                      20.0-50.0  Sed Rate (ESR)   Sed Rate                  15 mm/hr                    0-22   Impression & Recommendations:  Problem # 1:  77 y/o lady w/ mult chr medical problems... Dsicussed the need for better diet, incr exercise, get weight down... some of her fatigue could be from low potassium>> rec K20 Bid...  Problem # 2:  HYPERTENSION (ICD-401.9) Controlled-  same meds, but add the K20 two times a day... Her updated medication list for this problem includes:    Toprol Xl 50 Mg Tb24 (Metoprolol succinate) .[name]... Take 1 tablet by mouth once a day    Felodipine 5 Mg Tb24 (Felodipine) .[name]... 1 tab by mouth daily...    Hyzaar 100-25 Mg Tabs (Losartan potassium-hctz) .[name]... Take 1 tablet by mouth once a day  Orders: T-2 View CXR (71020TC) TLB-BMP (Basic Metabolic Panel-BMET) (80048-METABOL) TLB-Hepatic/Liver Function Pnl  (80076-HEPATIC) TLB-CBC Platelet - w/Differential (85025-CBCD) TLB-TSH (Thyroid Stimulating Hormone) (84443-TSH) TLB-IBC Pnl (Iron/FE;Transferrin) (83550-IBC) TLB-Sedimentation Rate (ESR) (85652-ESR)  Problem # 3:  CAD (ICD-414.00) No angina-  palpit better... followed by Walker Kehr for Cards. Her updated medication list for this problem includes:    Toprol Xl 50 Mg Tb24 (Metoprolol succinate) .[name]... Take  1 tablet by mouth once a day    Felodipine 5 Mg Tb24 (Felodipine) .[name]... 1 tab by mouth daily...    Hyzaar 100-25 Mg Tabs (Losartan potassium-hctz) .[name]... Take 1 tablet by mouth once a day  Problem # 4:  HYPERCHOLESTEROLEMIA (ICD-272.0) Stable on the Lip10... continue same. Her updated medication list for this problem includes:    Lipitor 10 Mg Tabs (Atorvastatin calcium) .[name]... Take 1 tablet by mouth once a day  Problem # 5:  Hx of THYROID NODULE (ICD-241.0) Stable & clinically euthyroid...  Problem # 6:  GERD (ICD-530.81) GI is stable... contiue same Rx. Her updated medication list for this problem includes:    Nexium 40 Mg Cpdr (Esomeprazole magnesium) .[name]... 1 tab by mouth two times a day    Levsin 0.125 Mg Tabs (Hyoscyamine sulfate) .[name]... Take 2 tablet by mouth four times a day  Problem # 7:  DEGENERATIVE JOINT DISEASE (ICD-715.90) She is managing satis w/ bilat TKRs... Her updated medication list for this problem includes:    Mobic 7.5 Mg Tabs (Meloxicam) .[name]... Take 1 tab by mouth once daily as needed for arthritis pain...  Problem # 8:  ANXIETY (ICD-300.00) She is still under the care of drPlovsky & on 3 drugsd from him... Her updated medication list for this problem includes:    Effexor Xr 75 Mg Xr24h-cap (Venlafaxine hcl) .[name]... Take 2 tablet by mouth every morning    Ativan 0.5 Mg Tabs (Lorazepam) .[name]... 1/2 to 1 tab by mouth three times a day as needed for nerves...  Problem # 9:  OTHER MEDICAL PROBLEMS AS NOTED>>>  Complete Medication List: 1)  Dorzolamide-timolol 2-0.5 %  Soln (Dorzolamide-timolol) .[name].. 1 drop in each eye three times a day 2)  Toprol Xl 50 Mg Tb24 (Metoprolol succinate) .... Take 1 tablet by mouth once a day 3)  Felodipine 5 Mg Tb24 (Felodipine) .[name].. 1 tab by mouth daily.Marland KitchenMarland Kitchen 4)  Hyzaar 100-25 Mg Tabs (Losartan potassium-hctz) .... Take 1 tablet by mouth once a day 5)  Lipitor 10 Mg Tabs (Atorvastatin calcium) .... Take 1 tablet by mouth once a day 6)  Nexium 40 Mg Cpdr (Esomeprazole magnesium) .[name].. 1 tab by mouth two times a day 7)  Levsin 0.125 Mg Tabs (Hyoscyamine sulfate) .... Take 2 tablet by mouth four times a day 8)  Mobic 7.5 Mg Tabs (Meloxicam) .... Take 1 tab by mouth once daily as needed for arthritis pain.Marland KitchenMarland Kitchen 9)  Oscal 500/200 D-3 500-200 Mg-unit Tabs (Calcium-vitamin d) .... Take one tablet by mouth two times a day 10)  Womens Multivitamin Plus Tabs (Multiple vitamins-minerals) .... Take 1 tab by mouth once daily... 11)  Vitamin D 1000 Unit Tabs (Cholecalciferol) .... Take 1 tablet by mouth once a day 12)  Effexor Xr 75 Mg Xr24h-cap (Venlafaxine hcl) .... Take 2 tablet by mouth every morning 13)  Ativan 0.5 Mg Tabs (Lorazepam) .... 1/2 to 1 tab by mouth three times a day as needed for nerves... 14)  Potassium Chloride Crys Cr 20 Meq Cr-tabs (Potassium chloride crys cr) .... Take 1 tab by mouth two times a day...  Other Orders: Prescription Created Electronically [phone])  Patient Instructions: 1)  Today we updated your med list- see below.... 2)  We refilled the meds you requested... 3)  Today we did your follow up CXR & blood work... please call the "phone tree" in a few days for your lab results.Marland KitchenMarland Kitchen 4)  Please take a good women's multivit + 1000 u Vit D daily.Marland KitchenMarland Kitchen 5)  Get some exercise every day... 6)  Be sure you have good sleep habits at night... 7)  Call for any questions... 8)  Please schedule a follow-up appointment in 6 months. Prescriptions: POTASSIUM CHLORIDE CRYS CR 20 MEQ CR-TABS (POTASSIUM CHLORIDE CRYS CR) take 1 tab by  mouth two times a day...  #60 x prn   Entered and Authorized by:   Michele Mcalpine MD   Signed by:   Michele Mcalpine MD on 08/31/2009   Method used:   Print then Give to Patient   RxID:   [id number] NEXIUM 40 MG  CPDR (ESOMEPRAZOLE MAGNESIUM) 1 tab by mouth two times a day  #60 x 11   Entered by:   Arman Filter LPN   Authorized by:   Michele Mcalpine MD   Signed by:   Arman Filter LPN on 30/86/5784   Method used:   Electronically to        [facility]  [address] [phone]* (retail)       [address]       [place], [place]  [id number]       Ph: [phone] or [phone]       Fax: [phone]   RxID:   [id number] MOBIC 7.5 MG  TABS (MELOXICAM) take 1 tab by mouth once daily as needed for arthritis pain...  #30 x prn   Entered and Authorized by:   Michele Mcalpine MD   Signed by:   Michele Mcalpine MD on 08/29/2009   Method used:   Print then Give to Patient   RxID:   [id number] NEXIUM 40 MG  CPDR (ESOMEPRAZOLE MAGNESIUM) 1 tab by mouth two times a day  #30 x prn   Entered and Authorized by:   Michele Mcalpine MD   Signed by:   Michele Mcalpine MD on 08/29/2009   Method used:   Print then Give to Patient   RxID:   [id number]

## 2010-09-04 NOTE — Assessment & Plan Note (Signed)
Summary: DIARRHEA...AS.   History of Present Illness Visit Type: Follow-up Visit Primary GI MD: Elie Goody MD [facility] Primary Provider: Alroy Dust MD Chief Complaint: Fecal incontinence, loose stools with frequency History of Present Illness:   This is an 77 year old female with a history of pandiverticulosis and prior diverticular bleeds who relates problems with frequent of fecal incontinence, worsening over the past 2 years. Her last colonoscopy was in 02/2008 showing small colon polyps and diverticulosis from the sigmoid to ascending colon. She states she has normal stools alternating with loose stools. She has episodes of fecal incontinence several times per week and frequently at night. She denies any recent antibiotic usage or medication changes.   GI Review of Systems      Denies abdominal pain, acid reflux, belching, bloating, chest pain, dysphagia with liquids, dysphagia with solids, heartburn, loss of appetite, nausea, vomiting, vomiting blood, weight loss, and  weight gain.      Reports change in bowel habits, diarrhea, and  fecal incontinence.     Denies anal fissure, black tarry stools, constipation, diverticulosis, heme positive stool, hemorrhoids, irritable bowel syndrome, jaundice, light color stool, liver problems, rectal bleeding, and  rectal pain.   Current Medications (verified): 1)  Dorzolamide-Timolol 2-0.5 % Soln (Dorzolamide-Timolol) .[name].. 1 Drop in Each Eye Three Times A Day 2)  Toprol Xl 50 Mg  Tb24 (Metoprolol Succinate) .... Take 1 Tablet By Mouth Once A Day 3)  Felodipine 5 Mg  Tb24 (Felodipine) .[name].. 1 Tab By Mouth Daily.Marland KitchenMarland Kitchen 4)  Hyzaar 100-25 Mg Tabs (Losartan Potassium-Hctz) .... Take 1 Tablet By Mouth Once A Day 5)  Potassium Chloride Crys Cr 20 Meq Cr-Tabs (Potassium Chloride Crys Cr) .... Take 1 Tab By Mouth Two Times A Day... 6)  Lipitor 10 Mg Tabs (Atorvastatin Calcium) .... Take 1 Tablet By Mouth Once A Day 7)  Nexium 40 Mg  Cpdr (Esomeprazole  Magnesium) .[name].. 1 Tab By Mouth Two Times A Day 8)  Mobic 7.5 Mg  Tabs (Meloxicam) .... Take 1 Tab By Mouth Once Daily As Needed For Arthritis Pain... 9)  Oscal 500/200 D-3 500-200 Mg-Unit Tabs (Calcium-Vitamin D) .... Take One Tablet By Mouth Two Times A Day 10)  Womens Multivitamin Plus  Tabs (Multiple Vitamins-Minerals) .... Take 1 Tab By Mouth Once Daily... 11)  Vitamin D 1000 Unit Tabs (Cholecalciferol) .... Take 1 Tablet By Mouth Once A Day 12)  Effexor Xr 75 Mg Xr24h-Cap (Venlafaxine Hcl) .... Take 2 Tablet By Mouth Every Morning 13)  Ativan 0.5 Mg  Tabs (Lorazepam) .... 1/2 To 1 Tab By Mouth Three Times A Day As Needed For Nerves...  Allergies (verified): 1)  ! Penicillin 2)  ! Keflex 3)  Amoxicillin (Amoxicillin)  Past History:  Past Medical History: Reviewed history from 02/09/2010 and no changes required. GLAUCOMA (ICD-365.9) HEARING LOSS (ICD-389.9) CHRONIC OSTEOMYELITIS OTHER SPECIFIED SITES (ICD-730.18) OBSTRUCTIVE SLEEP APNEA (ICD-327.23) BRONCHITIS, RECURRENT (ICD-491.9) HYPERTENSION (ICD-401.9) CAD (ICD-414.00) PALPITATIONS (ICD-785.1) HYPERCHOLESTEROLEMIA (ICD-272.0) Hx of THYROID NODULE (ICD-241.0) HIATAL HERNIA (ICD-553.3) GERD (ICD-530.81) DIVERTICULOSIS OF COLON (ICD-562.10) DIVERTICULAR BLEEDING, HX OF (ICD-V12.79) COLONIC POLYPS, ADENOMATOUS, HX OF (ICD-V12.72) DEGENERATIVE JOINT DISEASE (ICD-715.90) BACK PAIN, LUMBAR (ICD-724.2) VITAMIN D DEFICIENCY (ICD-268.9) ABNORMALITY OF GAIT (ICD-781.2) ANXIETY (ICD-300.00) ANEMIA (ICD-285.9)  Past Surgical History: Reviewed history from 02/09/2010 and no changes required. Appendectomy Cholecystectomy Hysterectomy TOTAL KNEE REPLACEMENT, LEFT, HX OF (ICD-V43.65) TOTAL KNEE REPLACEMENT, RIGHT, HX OF (ICD-V43.65) CATARACT EXTRACTION, HX OF (ICD-V45.61)  Family History: Reviewed history from 03/01/2008 and no changes required. mother deceased age 28  from accident father unknown no siblings No FH of Colon  Cancer:  Social History: Reviewed history from 03/10/2008 and no changes required. retired used to work as Barista alone.  no tob etoh widowed Daily Caffeine Use  Review of Systems       The pertinent positives and negatives are noted as above and in the HPI. All other ROS were reviewed and were negative.   Vital Signs:  Patient profile:   77 year old female Menstrual status:  postmenopausal Height:      65 inches Weight:      192 pounds BMI:     32.07 Pulse rate:   68 / minute Pulse rhythm:   regular BP sitting:   136 / 78  (left arm)  Vitals Entered By: Milford Cage NCMA (July 02, 2010 3:34 PM)  Physical Exam  General:  Well developed, well nourished, no acute distress. Head:  Normocephalic and atraumatic. Eyes:  PERRLA, no icterus. Mouth:  No deformity or lesions, dentition normal. Lungs:  Clear throughout to auscultation. Heart:  Regular rate and rhythm; no murmurs, rubs,  or bruits. Abdomen:  Soft, nontender and nondistended. No masses, hepatosplenomegaly or hernias noted. Normal bowel sounds. Rectal:  Decreased resting sphincter tone and decreased tone with voluntary squeeze. hemocult negative round stool in the vault. No lesions.   Neurologic:  Alert and  oriented x4;  grossly normal neurologically. Psych:  Alert and cooperative. Normal mood and affect.  Impression & Recommendations:  Problem # 1:  INCONTINENCE OF FECES (ICD-787.60) Begin Imodium once or twice daily for harder stools. Advised to sit on the commode at regular intervals throughout the day and before bedtime. Frther evaluation with [facility]. Orders: [facility])  Problem # 2:  DIVERTICULOSIS OF COLON (ICD-562.10) Long-term high fiber diet with adequate water intake.  Problem # 3:  DIVERTICULAR BLEEDING, HX OF (ICD-V12.79)  Problem # 4:  COLONIC POLYPS, ADENOMATOUS, HX OF (ICD-V12.72) No plans for routine surveillance  colonoscopy given age.  Problem # 5:  GERD (ICD-530.81) Symptoms well-controlled on Nexium 40 mg twice daily.  Patient Instructions: 1)  We are going to refer you to the [facility]. We will call you with a appointment date and time after we fax your  records.  2)  Use Imodium once to two times a day as needed. 3)  Copy sent to : Alroy Dust, MD 4)  The medication list was reviewed and reconciled.  All changed / newly prescribed medications were explained.  A complete medication list was provided to the patient / caregiver.

## 2010-09-04 NOTE — Progress Notes (Signed)
Summary: NEED MEDS FOR COLD  Medications Added HYZAAR 100-25 MG TABS (LOSARTAN POTASSIUM-HCTZ)  LIPITOR 10 MG TABS (ATORVASTATIN CALCIUM)  ZITHROMAX Z-PAK 250 MG  TABS (AZITHROMYCIN) take as directed       21  22  23  24  25   Phone Note Call from Patient   Caller: Patient Call For: Charman Blasco Summary of Call: WOULD LIKE MEDICATION FOR COLD PHARMACY  [facility] PATIENT'S CHART HAS BEEN REQUESTED  Initial call taken by: Rickard Patience,  September 14, 2007 11:28 AM  Follow-up for Phone Call        Pt was just seen by SN for 6 mo f/u 09/03/07.  Called spoke with pt onset of fever, chills, coughing-productive cloudy phlegm no color x 1 week ago.  Pt has been taking Mucinex but unable to get rid of cold.  Follow-up by: Cloyde Reams RN,  September 14, 2007 11:38 AM   New Allergies: AMOXICILLIN (AMOXICILLIN) New/Updated Medications: HYZAAR 100-25 MG TABS (LOSARTAN POTASSIUM-HCTZ)  LIPITOR 10 MG TABS (ATORVASTATIN CALCIUM)  ZITHROMAX Z-PAK 250 MG  TABS (AZITHROMYCIN) take as directed New Allergies: AMOXICILLIN (AMOXICILLIN) pt [facility] [address] ..................................................................Marland KitchenAbigail Miyamoto RN  September 14, 2007 1:01 PM Prescriptions: ZITHROMAX Z-PAK 250 MG  TABS (AZITHROMYCIN) take as directed  #1 x 0   Entered by:   Abigail Miyamoto RN   Authorized by:   Michele Mcalpine MD   Signed by:   Abigail Miyamoto RN on 09/14/2007   Method used:   Electronically sent to ...       [facility] [address] [id number][facility]       [address]       [place], [place]  [address]       Ph: [phone] or [phone]       Fax: [phone]   RxID:   [id number]

## 2010-09-04 NOTE — Assessment & Plan Note (Signed)
Summary: FU///KP  Medications Added MUCINEX 600 MG  TB12 (GUAIFENESIN) 1-2 tabs by mouth two times a day as directed... FELODIPINE 5 MG  TB24 (FELODIPINE) 1 tab by mouth daily... NEXIUM 40 MG  CPDR (ESOMEPRAZOLE MAGNESIUM) 1 tab by mouth daily taken 30 min before a meal... ATIVAN 0.5 MG  TABS (LORAZEPAM) 1/2 to 1 tab by mouth three times a day as needed for nerves... RESTORIL 30 MG  CAPS (TEMAZEPAM) take one capsule by mouth at bedtime as needed for sleep...        Chief Complaint:  6 month ROV....  History of Present Illness: 77 y/o BF here for a 6 month follow up visit... she has been doing well and had a good Christmas holiday... her only complaint is some stomach gas and she was wanting to see DrStark about this... she denies abd pain, N/V, D/C, change in bowel habits, blood, etc... she has a hx of diverticulosis w/ lower GIB in 2006, no known recurrence...   Current Problems:  OBSTRUCTIVE SLEEP APNEA (ICD-327.23) - mod OSA w/ sleep study 8/04 showing RDI=23, consult DrClance... Rx CPAP 10... BRONCHITIS, RECURRENT (ICD-491.9) HYPERTENSION (ICD-401.9) - controlled on TOPROL, PLENDIL, HYZAAR... BP=140/70 and asymptomatic- denies HA, fatigue, visual changes, CP, palipit, dizziness, syncope, dyspnea, edema, etc... CAD (ICD-414.00) - non-obstructive disease w/ cath 1/06 showing 30% focal stenosis in CIRC... NuclearStressTest 9/07 showed apical thinning, no ischemia, EF=69%... followed by Walker Kehr- last seen 9/08 & his note is reviewed. HYPERCHOLESTEROLEMIA (ICD-272.0) - on diet alone- last FLP 8/08 w/ TChol174, TG 76, HDL 65, LDL 94... Hx of THYROID NODULE (ICD-241.0) - she has multinodular thyroid, no palp nodules, clinically euthyroid & doing well. HIATAL HERNIA (ICD-553.3) - HH on prev CTscans... EGD in 1998 w/ distal esoph stricture dilated... no recurrent dysphagia etc... DIVERTICULOSIS OF COLON (ICD-562.10) - last colonoscopy 5/05 by DrStark w/ several sm 3-4 mm polyps, divertics, & f/u  planned 77yrs... hosp in 2006 w/ divertic bleed that resolved spont. DEGENERATIVE JOINT DISEASE (ICD-715.90) - followed by DrDean... S/P L TKR in 2004... known CSpine and L/S spine disease... s/p recent epid steroid shot in lower back w/ improvement... BACK PAIN, LUMBAR (ICD-724.2) ABNORMALITY OF GAIT (ICD-781.2) ANEMIA (ICD-285.9) - after her diverticular bleed in 2006... on Fe therapy w/ resolution.     Current Allergies (reviewed today): ! PENICILLIN ! KEFLEX  Past Medical History:        OBSTRUCTIVE SLEEP APNEA (ICD-327.23)    BRONCHITIS, RECURRENT (ICD-491.9)    HYPERTENSION (ICD-401.9)    CAD (ICD-414.00)    HYPERCHOLESTEROLEMIA (ICD-272.0)    Hx of THYROID NODULE (ICD-241.0)    HIATAL HERNIA (ICD-553.3)    DIVERTICULOSIS OF COLON (ICD-562.10)    DEGENERATIVE JOINT DISEASE (ICD-715.90)    BACK PAIN, LUMBAR (ICD-724.2)    ABNORMALITY OF GAIT (ICD-781.2)    ANEMIA (ICD-285.9)      Past Surgical History:    Appendectomy    Cholecystectomy    Hysterectomy    S/P cataract surg    S/P R TKR         Review of Systems       The patient complains of decreased hearing and difficulty walking.  The patient denies anorexia, fever, weight loss, vision loss, hoarseness, chest pain, syncope, peripheral edema, prolonged cough, hemoptysis, abdominal pain, melena, hematochezia, severe indigestion/heartburn, hematuria, incontinence, muscle weakness, suspicious skin lesions, transient blindness, depression, unusual weight change, abnormal bleeding, enlarged lymph nodes, and angioedema.     Vital Signs:  Patient Profile:   77 Years Old  Female Weight:      204 pounds O2 Sat:      98 % O2 treatment:    Room Air Temp:     97.7 degrees F oral Pulse rate:   66 / minute BP sitting:   140 / 70  (left arm)  Vitals Entered By: Marijo File CMA (September 02, 2007 11:04 AM)                 Physical Exam  WD, Overweight, 77 y/o BF in NAD... GENERAL:  Alert & oriented; pleasant &  cooperative. HEENT:  [place]/AT, EOM-wnl, PERRLA, EACs-clear, TMs-wnl, NOSE-clear, THROAT-clear & wnl. NECK:  Supple w/ full ROM; no JVD; normal carotid impulses w/o bruits; no thyromegaly or nodules palpated (cannot feel the multinod goiter), no lymphadenopathy. CHEST:  Clear to P & A; without wheezes/ rales/ or rhonchi. HEART:  Regular Rhythm; without murmurs/ rubs/ or gallops. ABDOMEN:  Soft & nontender; normal bowel sounds; no organomegaly or masses detected. EXT:  s/p TKR, mod arthritic changes; no varicose veins/ +venous insuffic/ tr edema. NEURO:  CN's intact; no focal neuro deficits... DERM:  No lesions noted; no rash etc...       Impression & Recommendations:  Problem # 1:  HYPERTENSION (ICD-401.9) Controlled on meds... refilled for 2009... reminded of low sodium diet. Her updated medication list for this problem includes:    Toprol Xl 50 Mg Tb24 (Metoprolol succinate) .[name]... Take 1 tablet by mouth once a day    Hyzaar 50-12.5 Mg Tabs (Losartan potassium-hctz) .[name]... Take 1 tablet by mouth once a day    Felodipine 5 Mg Tb24 (Felodipine) .[name]... 1 tab by mouth daily...   Problem # 2:  CAD (ICD-414.00) Stable... no CP etc... continue Rx. Her updated medication list for this problem includes:    Toprol Xl 50 Mg Tb24 (Metoprolol succinate) .[name]... Take 1 tablet by mouth once a day    Hyzaar 50-12.5 Mg Tabs (Losartan potassium-hctz) .[name]... Take 1 tablet by mouth once a day    Felodipine 5 Mg Tb24 (Felodipine) .[name]... 1 tab by mouth daily...   Problem # 3:  HYPERCHOLESTEROLEMIA (ICD-272.0) Doing satis on diet alone... continue same.  Problem # 4:  DIVERTICULOSIS OF COLON (ICD-562.10) No further bleeding etc... sher is concerned about her gas symptoms... I have rec MYLICON and PHAZYME to try and eliminate her symptom... she would like to see DrStark if symptoms continue.  Problem # 5:  DEGENERATIVE JOINT DISEASE (ICD-715.90) She is s/p epid steroid shot from DrDean and feeling some  better... continue ortho f/u and PT recs.  Complete Medication List: 1)  Mucinex 600 Mg Tb12 (Guaifenesin) .[name].. 1-2 tabs by mouth two times a day as directed... 2)  Toprol Xl 50 Mg Tb24 (Metoprolol succinate) .... Take 1 tablet by mouth once a day 3)  Hyzaar 50-12.5 Mg Tabs (Losartan potassium-hctz) .... Take 1 tablet by mouth once a day 4)  Felodipine 5 Mg Tb24 (Felodipine) .[name].. 1 tab by mouth daily.Marland KitchenMarland Kitchen 5)  Nexium 40 Mg Cpdr (Esomeprazole magnesium) .[name].. 1 tab by mouth daily taken 30 min before a meal... 6)  Ativan 0.5 Mg Tabs (Lorazepam) .... 1/2 to 1 tab by mouth three times a day as needed for nerves.Marland KitchenMarland Kitchen 7)  Celexa 20 Mg Tabs (Citalopram hydrobromide) .... Take 1 tablet by mouth once a day 8)  Restoril 30 Mg Caps (Temazepam) .... Take one capsule by mouth at bedtime as needed for sleep.Marland KitchenMarland Kitchen 9)  Cosopt 2-0.5 % Soln (Dorzolamide-timolol) .... Use one drop  three times a day 10)  Multivitamins Tabs (Multiple vitamin) .... Take 1 tablet by mouth once a day   Patient Instructions: 1)  Today we refilled your meds for 2009... 2)  Try MYLICON 1 tab or dropperful 4 times daily for upper gas, and PHAZYME 1 tab 4 times daily for lower gas... 3)  Please schedule a follow-up appointment in 6 months, we would like to do your follow up lab work before your next visit- please call us 1 week proir to return and we will put the labs in the computer...    Prescriptions: ATIVAN 0.5 MG  TABS (LORAZEPAM) 1/2 to 1 tab by mouth three times a day as needed for nerves...  #100 x prn   Entered and Authorized by:   Michele Mcalpine MD   Signed by:   Michele Mcalpine MD on 09/02/2007   Method used:   Print then Give to Patient   RxID:   [id number] RESTORIL 30 MG  CAPS (TEMAZEPAM) take one capsule by mouth at bedtime as needed for sleep...  #30 x prn   Entered and Authorized by:   Michele Mcalpine MD   Signed by:   Michele Mcalpine MD on 09/02/2007   Method used:   Print then Give to Patient   RxID:    [id number] CELEXA 20 MG  TABS (CITALOPRAM HYDROBROMIDE) Take 1 tablet by mouth once a day  #30 x prn   Entered and Authorized by:   Michele Mcalpine MD   Signed by:   Michele Mcalpine MD on 09/02/2007   Method used:   Print then Give to Patient   RxID:   [id number] NEXIUM 40 MG  CPDR (ESOMEPRAZOLE MAGNESIUM) 1 tab by mouth daily taken 30 min before a meal...  #30 x prn   Entered and Authorized by:   Michele Mcalpine MD   Signed by:   Michele Mcalpine MD on 09/02/2007   Method used:   Print then Give to Patient   RxID:   [id number] FELODIPINE 5 MG  TB24 (FELODIPINE) 1 tab by mouth daily...  #30 x prn   Entered and Authorized by:   Michele Mcalpine MD   Signed by:   Michele Mcalpine MD on 09/02/2007   Method used:   Print then Give to Patient   RxID:   [id number] HYZAAR 50-12.5 MG  TABS (LOSARTAN POTASSIUM-HCTZ) Take 1 tablet by mouth once a day  #30 x prn   Entered and Authorized by:   Michele Mcalpine MD   Signed by:   Michele Mcalpine MD on 09/02/2007   Method used:   Print then Give to Patient   RxID:   [id number] TOPROL XL 50 MG  TB24 (METOPROLOL SUCCINATE) Take 1 tablet by mouth once a day  #30 x prn   Entered and Authorized by:   Michele Mcalpine MD   Signed by:   Michele Mcalpine MD on 09/02/2007   Method used:   Print then Give to Patient   RxID:   [id number]  ]

## 2010-09-04 NOTE — Assessment & Plan Note (Signed)
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  She completed prolonged course of by mouth clinda although her compliance was quite questionable.  Last seen in Nov and then followed up with Dr Egbert Garibaldi and stopped her abx   [facility] saw him again in Feb and was told she is doing fine. No pain or drainage from the site.  Is depressed after losing your son but ow no new complaints  Preventive Screening-Counseling & Management     Alcohol drinks/day: 0     Smoking Status: never   Current Allergies (reviewed today): ! PENICILLIN ! KEFLEX AMOXICILLIN (AMOXICILLIN) Past History:  Past Medical History:    GLAUCOMA (ICD-365.9)    CHRONIC OSTEOMYELITIS OTHER SPECIFIED SITES (ICD-730.18)    OBSTRUCTIVE SLEEP APNEA (ICD-327.23)    BRONCHITIS, RECURRENT (ICD-491.9)    HYPERTENSION (ICD-401.9)    CAD (ICD-414.00)    HYPERCHOLESTEROLEMIA (ICD-272.0)    Hx of THYROID NODULE (ICD-241.0)    HIATAL HERNIA (ICD-553.3)    GERD (ICD-530.81)    DIVERTICULOSIS OF COLON (ICD-562.10)    DIVERTICULAR BLEEDING, HX OF (ICD-V12.79)    COLONIC POLYPS, ADENOMATOUS, HX OF (ICD-V12.72)    DEGENERATIVE JOINT DISEASE (ICD-715.90)    BACK PAIN, LUMBAR (ICD-724.2)    VITAMIN D DEFICIENCY (ICD-268.9)    ABNORMALITY OF GAIT (ICD-781.2)    ANXIETY (ICD-300.00)    ANEMIA (ICD-285.9)     (08/29/2008)  Past Surgical History:    Appendectomy    Cholecystectomy    Hysterectomy    S/P cataract surg    S/P R TKR     (08/29/2008)  Family History:    mother deceased age 35 from accident    father unknown    no siblings     (03/01/2008)  Social History:    retired    used to work as Regulatory affairs officer alone.     no tob etoh    widowed    Daily Caffeine Use     (03/10/2008)  Risk Factors:    Alcohol Use: 0 (11/08/2008)    >5 drinks/d w/in  last 3 months: N/A    Caffeine Use: N/A    Diet: N/A    Exercise: N/A  Risk Factors:    Smoking Status: never (11/08/2008)    Packs/Day: N/A    Cigars/wk: N/A    Pipe Use/wk: N/A    Cans of tobacco/wk: N/A    Passive Smoke Exposure: N/A  Additional History    Menstrual Status:  postmenopausal  Review of Systems       11 systems reviewed and negative except per HPI   Vital Signs:  Patient profile:   77 year old female Menstrual status:  postmenopausal Height:      65.5 inches (166.37 cm) Weight:      191.13 pounds (86.88 kg) BMI:     31.44 Temp:     97.7 degrees F (36.50 degrees C) oral Pulse rate:   60 / minute BP sitting:   143 / 77  (left arm)  Vitals Entered By: Starleen Arms CMA (November 08, 2008 10:22 AM) Is Patient Diabetic? No Pain Assessment Patient in pain? no      Nutritional Status BMI of > 30 = obese Nutritional Status Detail nl  Have you ever been in a relationship where you felt threatened, hurt  or afraid?No   Does patient need assistance? Functional Status Self care Ambulation Normal  years   days  Menstrual Status postmenopausal   Physical Exam  General:  alert and well-developed.   Head:  no tenderness over sinuses or maxilla Mouth:  SHe has an empty tooth socket in her R upper jaw but no pain or draiange from site Cervical Nodes:  none Psych:  Oriented X3.     Impression & Recommendations:  Problem # 1:  CHRONIC OSTEOMYELITIS OTHER SPECIFIED SITES (ICD-730.18) She is off clinda now for >3 monhs without any issue so will continue to monitor off abx.  I will have her continue regular visits with Dr Egbert Garibaldi and I will be glad to see her on a as needed basis.  PRIOR HISTORY She has had recurrent problems at this site of her prior tooth extraction for at least a year now.  Has had debridement and imaging and path shows chronic osteo.  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  her treatment is complicated by a  pcn allergy. She has been on clindamycin 300 mg q 6 hrs for least 6  months although her compliance is recently quite questionable.  She  tolerated it well until recently when she says she decreased it to twice a day.  She has marked clinical improvement and I can see no opening, drainage or tenderness on exam today.   I am concerned that she really has not been taking the clinda regularly based on review with her pharmacy however she has clinically responded.  I think it best to continue her clinda until she sees Dr Egbert Garibaldi this month then stop and observe off therapy.  If it recurs may need IV therapy or at least home health nursing to ensure she is getting her meds.   Her updated medication list for this problem includes:    Cleocin 300 Mg Caps (Clindamycin hcl) ..... One four times a day by mouth  Her updated medication list for this problem includes:    Clindamycin Hcl 300 Mg Caps (Clindamycin hcl) ..... One tablet by mouth four times a day  Orders: Est. Patient Level I (16109)  Patient Instructions: 1)  Follow up as needed

## 2010-09-04 NOTE — Assessment & Plan Note (Signed)
Summary: FU VISIT/DS   Visit Type:  Follow-up PCP:  Dr Kriste Basque Jeanelle Malling  Chief Complaint:  f/u ov.  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  I first saw here in May at whcich point she still had some daily drainage from the stie.  We started clinda and she has tolerated this without the usual N/V or diarrhea.  She has noticed some improvment in the drainage from the site but it is still a little tender.  Has not seen Dr Egbert Garibaldi in follow up yet.  No fevers or chills or other systemic sxs  Prior history Dr Egbert Garibaldi saw her and did a tooth extraction about 1-2 yrs ago for decay but not active infection.  She noticed tenderness at the site following that.  Then in Feb 09 had the flu and then started to develop drainage from site of prior extraction (would spit some blood and pus out).  SHe had a small amount of drainage prior to that when she would brush her teeth.  Did not have any systemic sympotms (no fevers, chills)  DId have the flu like illness wiht fever chillsand cough/ST.   Saw Dr Egbert Garibaldi again in April and had biopsies performed with path dx of "florid acute and chronic inflammation with focal abscess and fistula and bacterial colonies c/w actinomyces".  Cultures revealed normal op flora. She has continued to have minimal drainage form the site but is otherwise well.    Updated Prior Medication List: MUCINEX 600 MG  TB12 (GUAIFENESIN) 1-2 tabs by mouth two times a day as directed... TOPROL XL 50 MG  TB24 (METOPROLOL SUCCINATE) Take 1 tablet by mouth once a day HYZAAR 50-12.5 MG  TABS (LOSARTAN POTASSIUM-HCTZ) Take 1 tablet by mouth once a day FELODIPINE 5 MG  TB24 (FELODIPINE) 1 tab by mouth daily... NEXIUM 40 MG  CPDR (ESOMEPRAZOLE MAGNESIUM) 1 tab by mouth daily taken 30 min before a meal... ATIVAN 0.5 MG  TABS (LORAZEPAM) 1/2 to 1 tab by mouth three times a day as needed for nerves... CELEXA 20  MG  TABS (CITALOPRAM HYDROBROMIDE) Take 1 tablet by mouth once a day RESTORIL 30 MG  CAPS (TEMAZEPAM) take one capsule by mouth at bedtime as needed for sleep... COSOPT 2-0.5 %  SOLN (DORZOLAMIDE-TIMOLOL) use one drop three times a day MULTIVITAMINS   TABS (MULTIPLE VITAMIN) Take 1 tablet by mouth once a day HYZAAR 100-25 MG TABS (LOSARTAN POTASSIUM-HCTZ)  LIPITOR 10 MG TABS (ATORVASTATIN CALCIUM)  CLEOCIN 300 MG  CAPS (CLINDAMYCIN HCL) one four times a day by mouth  Current Allergies (reviewed today): ! PENICILLIN ! KEFLEX AMOXICILLIN (AMOXICILLIN)  Past Medical History:    Reviewed history from 09/02/2007 and no changes required:              OBSTRUCTIVE SLEEP APNEA (ICD-327.23)       BRONCHITIS, RECURRENT (ICD-491.9)       HYPERTENSION (ICD-401.9)       CAD (ICD-414.00)       HYPERCHOLESTEROLEMIA (ICD-272.0)       Hx of THYROID NODULE (ICD-241.0)       HIATAL HERNIA (ICD-553.3)       DIVERTICULOSIS OF COLON (ICD-562.10)       DEGENERATIVE JOINT DISEASE (ICD-715.90)       BACK PAIN, LUMBAR (ICD-724.2)       ABNORMALITY OF GAIT (ICD-781.2)       ANEMIA (ICD-285.9)  Family History:    Reviewed history from 12/11/2007 and no changes required:       [place]  Social History:    Reviewed history from 12/11/2007 and no changes required:       retired,used to work as Diplomatic Services operational officer. Lives alone. no tob etoh   Risk Factors: Tobacco use:  never  Colonoscopy History:    Date of Last Colonoscopy:  02/11/2008   Review of Systems       11 systems reviewed and negative except per HPI    Vital Signs:  Patient Profile:   77 Years Old Female Height:     65.5 inches (166.37 cm) Weight:      193 pounds (87.73 kg) BMI:     31.74 Temp:     97 degrees F (36.11 degrees C) oral BP sitting:   171 / 80  (right arm)  Vitals Entered By: Tomasita Morrow RN (February 25, 2008 8:53 AM)             Is Patient Diabetic? No Nutritional Status BMI of > 30 = obese Nutritional Status Detail  normal  Have you ever been in a relationship where you felt threatened, hurt or afraid?No  Domestic Violence Intervention none  Does patient need assistance? Functional Status Self care Ambulation Impaired:Risk for fall Comments Cane use     Physical Exam  General:     nad Head:     normocephalic.   Mouth:     pharynx pink and moist, no erythema, and no exudates.   tooth #4 socket with small opening, minimally tender when probed, no fluiud expressed Neurologic:     alert & oriented X3.   Skin:     no rashes.   Psych:     Oriented X3.      Impression & Recommendations:  Problem # 1:  CHRONIC OSTEOMYELITIS OTHER SPECIFIED SITES (ICD-730.18) She has had recurrent problems at this site of her prior tooth extraction for at least a year now.  Has had debridement and imaging and path shows chronic osteo.  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  her treatment is complicated by a pcn allergy. She has been on clindamycin 300 mg q 6 hrs for last 2 months and tolerated it well and has some clinical improvement.  WIll continue with original plan for total 6 month course as she tolerates it.  SHe will follow up with Dr Egbert Garibaldi as scheduled.   Advised her to continue to monitor for side effects and diarrhea with the abx.  Please call with questions or concerns and thank you for the referral.  The following medications were removed from the medication list:    Zithromax Z-pak 250 Mg Tabs (Azithromycin) .[name]... Take as directed  Her updated medication list for this problem includes:    Cleocin 300 Mg Caps (Clindamycin hcl) ..... One four times a day by mouth    Patient Instructions: 1)  Please schedule a follow-up appointment in 3 months.   ]

## 2010-09-04 NOTE — Progress Notes (Signed)
Summary: Cancel referral appt.  Phone Note Call from Patient Call back at [facility] Phone [phone]   Caller: Patient Call For: Dr. [name] Reason for Call: Talk to Nurse Summary of Call: Wants to cancel her appt. with [facility] and requesting to speak directly to [facility] Initial call taken by: Karna Christmas,  July 03, 2010 8:59 AM  Follow-up for Phone Call        Pt states she may have trouble finding someone to go with her to the appt in [facility] and might not want to drive that distance to another clinic. Told pt that is completely up to her but Dr. [name] would like her evaluated by them for this issue. Pt states she will try to find someone but if she cannot then she will call back and cancel the appointment. Told her that I would go ahead with the referral and to call me back if her transportation issue changes. Follow-up by: Christie Nottingham CMA Duncan Dull),  July 03, 2010 10:41 AM  Additional Follow-up for Phone Call Additional follow up Details #1::        Above noted.  Additional Follow-up by: Meryl Dare MD Clementeen Graham,  July 03, 2010 11:01 AM

## 2010-09-04 NOTE — Miscellaneous (Signed)
Summary: Flu Vaccine/[facility]  Flu Vaccine/[facility]   Imported By: Lanelle Bal 07/15/2008 09:24:46  _____________________________________________________________________  External Attachment:    Type:   Image     Comment:   External Document

## 2010-09-04 NOTE — Procedures (Signed)
Summary: Colonoscopy and biopsy   Colonoscopy  Procedure date:  12/28/2003  Findings:      Results: Polyp.  Results: Diverticulosis.       Location:  [facility].    Procedures Next Due Date:    Colonoscopy: 01/2009  Colonoscopy  Procedure date:  12/28/2003  Findings:      Results: Polyp.  Results: Diverticulosis.       Location:  [facility].    Procedures Next Due Date:    Colonoscopy: 01/2009 Patient Name: [name], [name] MRN: [id number] Procedure Procedures: Colonoscopy CPT: 40981.    with polypectomy. CPT: A3573898.  Personnel: Endoscopist: Roosevelt Eimers L. Juanda Chance, MD.  Referred By: Lonzo Cloud. Kriste Basque, MD.  Exam Location: Exam performed in Endoscopy Suite.  Patient Consent: Procedure, Alternatives, Risks and Benefits discussed, consent obtained,  Indications Symptoms: Hematochezia.  Surveillance of: Adenomatous Polyp(s). Initial polypectomy was performed in 2002. 2002.  History  Current Medications: Patient is not currently taking Coumadin.  Pre-Exam Physical: Performed Dec 28, 2003. Cardio-pulmonary exam, Rectal exam, HEENT exam , Abdominal exam, Extremity exam, Neurological exam, Mental status exam WNL.  Exam Exam: Extent of exam reached: Cecum, extent intended: Cecum.  The cecum was identified by appendiceal orifice and IC valve. Colon retroflexion performed. Images taken. ASA Classification: I. Tolerance: good.  Monitoring: Pulse and BP monitoring, Oximetry used. Supplemental O2 given.  Colon Prep Used Miralax for colon prep. Prep results: good.  Fluoroscopy: Fluoroscopy was not used.  Sedation Meds: Fentanyl 87.5 given IV. Versed 8 mg. given IV.  Findings - DIVERTICULOSIS: Cecum to Sigmoid Colon. Not bleeding. ICD9: Diverticulosis: 562.10. Comments: extensive left and right colon diverticulosis.  - MULTIPLE POLYPS: Sigmoid Colon. minimum size 3 mm, maximum size 4 mm. Procedure:  snare with cautery, removed, Polyp retrieved,  2 polyps Polyps sent to pathology. ICD9: Colon Polyps: 211.3. Comments: one polyp removed with hot biopsies and one by snare.   Assessment Abnormal examination, see findings above.  Diagnoses: 562.10: Diverticulosis.  211.3: Colon Polyps.   Comments: s/p diverticular bleed, extensive universal diverticulosis Events  Unplanned Interventions: No intervention was required.  Unplanned Events: There were no complications. Plans Medication Plan: Await pathology. Fiber supplements: Psyllium 1 Tbsp QD, starting Dec 28, 2003   Patient Education: Patient given standard instructions for: Yearly hemoccult testing recommended. Patient instructed to get routine colonoscopy every 5 years.  Comments: discussed surgery with the husband, since this has been the therd bleed, she would have to  have total colectomy Disposition: After procedure patient sent to recovery.  This report was created from the original endoscopy report, which was reviewed and signed by the above listed endoscopist.    cc: Lonzo Cloud. Kriste Basque, MD       [facility] Surgical Pathology - STATUS: Final             By: Morrie Sheldon,       Perform Date: [phone] 00:00  Ordered By: Caffie Pinto Date: [phone] 16:54  Facility: [facility]                              Department: CPATH  Service Report Text  [facility]   [address] [place], [place] [address]   [phone]    REPORT OF SURGICAL PATHOLOGY    Case #: [id number]   Patient Name: [name], [name]  [name]   PID: [id number]   Pathologist: Beulah Gandy. Luisa Hart, MD   DOB/Age [birth year] (Age: 77) Gender: F   Date Taken: 12/28/2003   Date Received: 12/28/2003    FINAL DIAGNOSIS    ***MICROSCOPIC EXAMINATION AND DIAGNOSIS***    1. COLON, 15 CM POLYP: BENIGN POLYPOID COLONIC MUCOSA WITH   SMALL LYMPHOID AGGREGATE. NO ADENOMATOUS CHANGE OR CARCINOMA   IDENTIFIED.    2. COLON, 30 CM POLYP: HYPERPLASTIC POLYP. NO ADENOMATOUS    CHANGE OR MALIGNANCY IDENTIFIED.    cf   Date Reported: 12/29/2003 Beulah Gandy. Luisa Hart, MD   *** Electronically Signed Out By JDP ***    Clinical information   S/P diverticular bleed, benign polyps. (mw) (JDP:caf 12/29/03)    specimen(s) obtained   1: Colon, polyp(s), @ 15cm   2: Colon, polyp(s), @ 30cm    Gross Description   1. Received in formalin is a tan, soft tissue fragment that is   submitted in toto. Size: 0.2cm    2. Received in formalin is a tan, soft tissue fragment that is   submitted in toto. Size: 0.3cm one block. (CL:gt) 12/29/03    gdt/

## 2010-09-04 NOTE — Letter (Signed)
Summary: Recall Colonoscopy Letter  [facility]  [address]   [place], [place] [address]   Phone: [phone]  Fax: [phone]      October 28, 2008 MRN: [id number]   KASSIDI DECASAS [address] [place], [place]  [address]   Dear Ms. Claudina Lick,   According to your medical record, it is time for you to schedule a Colonoscopy. The American Cancer Society recommends this procedure as a method to detect early colon cancer. Patients with a family history of colon cancer, or a personal history of colon polyps or inflammatory bowel disease are at increased risk.  This letter has beeen generated based on the recommendations made at the time of your procedure. If you feel that in your particular situation this may no longer apply, please contact our office.  Please call our office at [phone] to schedule this appointment or to update your records at your earliest convenience.  Thank you for cooperating with [place] to provide you with the very best care possible.   Sincerely,  Judie Petit T. [name], M.D.  [facility] Gastroenterology Division [phone]

## 2010-09-04 NOTE — Assessment & Plan Note (Signed)
Summary: 6 months/apc   Primary Care Provider:  Alroy Dust MD  CC:  6 month ROV & review of mult medical problems....  History of Present Illness: 77 y/o BF here for a 6 month follow up visit...  she has multiple medical problems as noted below...  Followed for general medical purposes w/ hx of OSA, HBP, CAD, Hyperchol, Multinod thyroid, HH, Divertics & polyps, DJD, Anxiety, etc...   ~  she developed an osteomyelitis of the right mandible in 2009 related to a tooth abcess and path showed bact colonies consistant w/ Actinomyces- oral surg= DrRehm & seen by ID- DrFitzgerald and was treated w/ Clindamycin (due to Pen allergy)... notes reviewed... off meds now & improved.   ~  August 29, 2009:  her CC is "tired- no energy" & we discussed checking blood work... needs to incr exercise program/ mobility, take MVI/ VitD... she saw DrNishan 11/10 for f/u HBP, palpit, Lipids> event recorder showed PACs/ PVCs only & no med changes made... she has severe DJD & bilat TKRs> uses Mobic Prn... still under the care of DrPlovsky for psyche- & remains on Wellbutrin, Celexa, Lorazepam...   ~  February 09, 2010:  she fell recently & hurt her left hand w/ some persist tingling & she will check w/ her Ortho, DrDean... otherw stable- using CPAP & tol well;  BP controlled on meds;  no CP/ angina;  Lipids controlled on Lip10;  GI is stable;  DJD on Mobic; and nerve meds changed by DrPlovsky... she will ret for f/u fasting labs.    Current Problem List:              **NOTE: long hx of chronic non-compliance w/ Med Rx...  GLAUCOMA - on eye drops from DrShapiro...  HEARING LOSS - saw DrByers for hearing eval & they discussed hearing aides.  CHRONIC OSTEOMYELITIS OTHER SPECIFIED SITES (ICD-730.18) - she developed osteomyelitis of the right mandible related to a tooth abcess and path showed bact colonies consistant w/ Actinomyces- oral surg= DrRehm & seen by ID- DrFitzgerald and was treated w/ Clindamycin (due to Pen  allergy)...  she stopped the Clindamycin on her own... last seen by DrFitzgerald 4/10 (76mo off Clinda) and doing satis- he released her... still follows up w/ DrRehm for OralSurg...  OBSTRUCTIVE SLEEP APNEA (ICD-327.23) - mod OSA w/ sleep study 8/04 showing RDI=23, consult DrClance... Rx CPAP 10... states she is using CPAP regularly and tol well...  BRONCHITIS, RECURRENT (ICD-491.9) - no recent upper resp infection...  ~  CXR 1/11 showed clear lungs, WNL...  HYPERTENSION (ICD-401.9) - controlled on TOPROL XL 50mg /d, PLENDIL 5mg /d, HYZAAR 100/25 daily, & KCl Bid... BP=140/78 and asymptomatic- denies HA, fatigue, visual changes, CP, palipit, dizziness, syncope, dyspnea, edema, etc... she has a long hx of chr noncompliance w/ med Rx...  ~  labs 1/11 showed K= 3.1, BUN= 14, Creat= 1.0... rec> start K20 Bid...  CAD (ICD-414.00) - non-obstructive disease w/ cath 1/06 showing 30% focal stenosis in CIRC... NuclearStressTest 9/07 showed apical thinning, no ischemia, EF=69%... followed by Walker Kehr- last seen 11/10 for f/u of her HBP, palpit, lipids> Event recoder showed benign PVCs & PACs, no change in med Rx... she can't take ASA, she says.  HYPERCHOLESTEROLEMIA (ICD-272.0) - on LIPITOR 10mg /d...   ~  FLP 8/08 w/ TChol 174, TG 76, HDL 65, LDL 94  ~  FLP 7/09 showed TChol 173, TG 76, HDL 57, LDL 101  ~  FLP 7/10 showed TChol 174, TG 94, HDL 57, LDL  99  ~  FLP 7/11 showed   Hx of THYROID NODULE (ICD-241.0) - she has multinodular thyroid, no palp nodules, clinically euthyroid & doing well...   ~  labs 8/08 showed TSH = 0.87  ~  labs 7/09 showed TSH = 0.70  ~  labs 7/10 showed TSH= 1.12  ~  labs 1/11 showed TSH= 1.06  ~  labs 7/11 showed   HIATAL HERNIA (ICD-553.3) - on NEXIUM 40mg /d... HH on prev CTscans... EGD in 1998 w/ distal esoph stricture dilated... no recurrent dysphagia etc...  ~  last EGD 7/09 by DrStark showed 4cmHH, GERD, gastritis & gastic polyps- neg HPylori... Rx w/ NEXIUM  40mg Bid...  DIVERTICULOSIS OF COLON (ICD-562.10), & COLONIC POLYPS, ADENOMATOUS, HX OF (ICD-V12.72) - colonoscopy 5/05 by DrStark w/ several sm 3-4 mm polyps, divertics, & f/u planned 55yrs... hosp in 2006 w/ divertic bleed that resolved spont... repeat hosp and f/u colonoscopy 7/09 showed divertics, and several 4-48mm adenomatous polyps...  DEGENERATIVE JOINT DISEASE (ICD-715.90) - followed by DrDean... S/P Left TKR in 12/04 by DrDean & Right TKR 7/02 by DrKrege... known CSpine and L/S spine disease... s/p recent epid steroid shot in lower back w/ improvement... we refilled her MOBIC 7.5mg  for as needed use.  BACK PAIN, LUMBAR (ICD-724.2)  VITAMIN D DEFICIENCY (ICD-268.9) - Vit D level Jul09 = 19 & started on Vit D 50K weekly...  ~  labs 7/10 showed Vit D level = 33... OK to switch to OTC Vit D 1000 u daily, also takes MVI & Calcium.  ABNORMALITY OF GAIT (ICD-781.2)  ANXIETY (ICD-300.00) - she is under the care of DrPlovsky for Psychiatry now on EFFEXOR XR 75mg - 2 tabs Qam, & LORAZEPAM 0.5mg  as needed... she was prev on Wellbutrin & Celexa, but DrPlovsky makes fre changes.  ANEMIA (ICD-285.9) - after her diverticular bleed in 2006... on Fe therapy w/ resolution...  ~  labs 8/08 showed Hg= 12.8  ~  labs 7/10 showed Hg= 13.2  ~  labs 1/11 showed Hg= 13.9, Fe= 66 (16%sat)  ~  labd 7/11 showed     Allergies: 1)  ! Penicillin 2)  ! Keflex 3)  Amoxicillin (Amoxicillin)  Comments:  Nurse/Medical Assistant: The patient's medications and allergies were reviewed with the patient and were updated in the Medication and Allergy Lists.  Past History:  Past Medical History: GLAUCOMA (ICD-365.9) HEARING LOSS (ICD-389.9) CHRONIC OSTEOMYELITIS OTHER SPECIFIED SITES (ICD-730.18) OBSTRUCTIVE SLEEP APNEA (ICD-327.23) BRONCHITIS, RECURRENT (ICD-491.9) HYPERTENSION (ICD-401.9) CAD (ICD-414.00) PALPITATIONS (ICD-785.1) HYPERCHOLESTEROLEMIA (ICD-272.0) Hx of THYROID NODULE (ICD-241.0) HIATAL  HERNIA (ICD-553.3) GERD (ICD-530.81) DIVERTICULOSIS OF COLON (ICD-562.10) DIVERTICULAR BLEEDING, HX OF (ICD-V12.79) COLONIC POLYPS, ADENOMATOUS, HX OF (ICD-V12.72) DEGENERATIVE JOINT DISEASE (ICD-715.90) BACK PAIN, LUMBAR (ICD-724.2) VITAMIN D DEFICIENCY (ICD-268.9) ABNORMALITY OF GAIT (ICD-781.2) ANXIETY (ICD-300.00) ANEMIA (ICD-285.9)  Past Surgical History: Appendectomy Cholecystectomy Hysterectomy TOTAL KNEE REPLACEMENT, LEFT, HX OF (ICD-V43.65) TOTAL KNEE REPLACEMENT, RIGHT, HX OF (ICD-V43.65) CATARACT EXTRACTION, HX OF (ICD-V45.61)  Family History: Reviewed history from 03/01/2008 and no changes required. mother deceased age 75 from accident father unknown no siblings  Social History: Reviewed history from 03/10/2008 and no changes required. retired used to work as Barista alone.  no tob etoh widowed Daily Caffeine Use  Review of Systems      See HPI       The patient complains of decreased hearing, dyspnea on exertion, and difficulty walking.  The patient denies anorexia, fever, weight loss, weight gain, vision loss, hoarseness, chest pain, syncope, peripheral edema, prolonged cough, headaches, hemoptysis, abdominal  pain, melena, hematochezia, severe indigestion/heartburn, hematuria, incontinence, muscle weakness, suspicious skin lesions, transient blindness, depression, unusual weight change, abnormal bleeding, enlarged lymph nodes, and angioedema.    Vital Signs:  Patient profile:   77 year old female Menstrual status:  postmenopausal Height:      65 inches Weight:      190 pounds BMI:     31.73 O2 Sat:      95 % on Room air Temp:     98.2 degrees F oral Pulse rate:   76 / minute BP sitting:   140 / 78  (right arm) Cuff size:   regular  Vitals Entered By: Randell Loop CMA (February 09, 2010 11:23 AM)  O2 Sat at Rest %:  95 O2 Flow:  Room air CC: 6 month ROV & review of mult medical problems... Is Patient Diabetic? No Pain Assessment Patient in  pain? yes      Onset of pain  LEFT HAND PAIN X 1 WEEK--WEAKNESS IN LEFT ARM Comments MEDS UPDATED TODAY WITH PT   Physical Exam  Additional Exam:  WD, Overweight, 77 y/o BF in NAD... wt= 190#  GENERAL:  Alert, pleasant & cooperative... I did not do formal MMSE... HEENT:  [place]/AT, EOM-wnl, PERRLA, EACs-clear, TMs-wnl, NOSE-clear, THROAT-clear & wnl, JAW- no pain or tenderness. NECK:  Supple w/ fairROM; no JVD; normal carotid impulses w/o bruits; no thyromegaly or nodules palpated (cannot feel the multinod goiter), no lymphadenopathy. CHEST:  Clear to P & A; without wheezes/ rales/ or rhonchi heard... HEART:  Regular Rhythm; without murmurs/ rubs/ or gallops detected... ABDOMEN:  Soft & nontender; normal bowel sounds; no organomegaly or masses palpated... EXT:  s/p bilat TKRs, mod arthritic changes; no varicose veins/ +venous insuffic/ tr edema. NEURO:  CN's intact; no focal neuro deficits... DERM:  No lesions noted; no rash etc...    MISC. Report  Procedure date:  02/09/2010  Findings:      DATA REVIEWED:  She will return to our lab for FASTING blood work, and we will communicate the results to her when available... SN   Impression & Recommendations:  Problem # 1:  CHRONIC OSTEOMYELITIS OTHER SPECIFIED SITES (ICD-730.18) This appears stable, resolved> still followed by DrRehm & no recent problems reported...  Problem # 2:  OBSTRUCTIVE SLEEP APNEA (ICD-327.23) Stable on CPAP... no daytime symptoms> doing well...  Problem # 3:  HYPERTENSION (ICD-401.9) Controlled on meds>  continue same. Her updated medication list for this problem includes:    Toprol Xl 50 Mg Tb24 (Metoprolol succinate) .[name]... Take 1 tablet by mouth once a day    Felodipine 5 Mg Tb24 (Felodipine) .[name]... 1 tab by mouth daily...    Hyzaar 100-25 Mg Tabs (Losartan potassium-hctz) .[name]... Take 1 tablet by mouth once a day  Problem # 4:  CAD (ICD-414.00) Stable>  no angina... Her updated medication list for this  problem includes:    Toprol Xl 50 Mg Tb24 (Metoprolol succinate) .[name]... Take 1 tablet by mouth once a day    Felodipine 5 Mg Tb24 (Felodipine) .[name]... 1 tab by mouth daily...    Hyzaar 100-25 Mg Tabs (Losartan potassium-hctz) .[name]... Take 1 tablet by mouth once a day  Problem # 5:  HYPERCHOLESTEROLEMIA (ICD-272.0) She will ret for FLP & we will contact her w/ the results. Her updated medication list for this problem includes:    Lipitor 10 Mg Tabs (Atorvastatin calcium) .[name]... Take 1 tablet by mouth once a day  Problem # 6:  Hx of THYROID  NODULE (ICD-241.0) TFT's have been stable, and no local symptoms...  Problem # 7:  GERD (ICD-530.81) GI is stable>  same meds... The following medications were removed from the medication list:    Levsin 0.125 Mg Tabs (Hyoscyamine sulfate) .[name]... Take 2 tablet by mouth four times a day Her updated medication list for this problem includes:    Nexium 40 Mg Cpdr (Esomeprazole magnesium) .[name]... 1 tab by mouth two times a day  Problem # 8:  DEGENERATIVE JOINT DISEASE (ICD-715.90) She will f/u w/ DrDean for Ortho... Her updated medication list for this problem includes:    Mobic 7.5 Mg Tabs (Meloxicam) .[name]... Take 1 tab by mouth once daily as needed for arthritis pain...  Problem # 9:  ANXIETY (ICD-300.00) Followed & Rx from DrPlovsky... Her updated medication list for this problem includes:    Effexor Xr 75 Mg Xr24h-cap (Venlafaxine hcl) .[name]... Take 2 tablet by mouth every morning    Ativan 0.5 Mg Tabs (Lorazepam) .[name]... 1/2 to 1 tab by mouth three times a day as needed for nerves...  Complete Medication List: 1)  Dorzolamide-timolol 2-0.5 % Soln (Dorzolamide-timolol) .[name].. 1 drop in each eye three times a day 2)  Toprol Xl 50 Mg Tb24 (Metoprolol succinate) .... Take 1 tablet by mouth once a day 3)  Felodipine 5 Mg Tb24 (Felodipine) .[name].. 1 tab by mouth daily.Marland KitchenMarland Kitchen 4)  Hyzaar 100-25 Mg Tabs (Losartan potassium-hctz) .... Take 1 tablet by mouth once a day 5)   Potassium Chloride Crys Cr 20 Meq Cr-tabs (Potassium chloride crys cr) .... Take 1 tab by mouth two times a day... 6)  Lipitor 10 Mg Tabs (Atorvastatin calcium) .... Take 1 tablet by mouth once a day 7)  Nexium 40 Mg Cpdr (Esomeprazole magnesium) .[name].. 1 tab by mouth two times a day 8)  Mobic 7.5 Mg Tabs (Meloxicam) .... Take 1 tab by mouth once daily as needed for arthritis pain.Marland KitchenMarland Kitchen 9)  Oscal 500/200 D-3 500-200 Mg-unit Tabs (Calcium-vitamin d) .... Take one tablet by mouth two times a day 10)  Womens Multivitamin Plus Tabs (Multiple vitamins-minerals) .... Take 1 tab by mouth once daily... 11)  Vitamin D 1000 Unit Tabs (Cholecalciferol) .... Take 1 tablet by mouth once a day 12)  Effexor Xr 75 Mg Xr24h-cap (Venlafaxine hcl) .... Take 2 tablet by mouth every morning 13)  Ativan 0.5 Mg Tabs (Lorazepam) .... 1/2 to 1 tab by mouth three times a day as needed for nerves...  Patient Instructions: 1)  Today we updated your med list- see below.... 2)  Continue your current meds the same for now... 3)  Please return to our lab one morning next week for your FASTING blood work... then please call the "phone tree" in a few days for your lab results.Marland KitchenMarland Kitchen  4)  Call for any problems.Marland KitchenMarland Kitchen 5)  Please schedule a follow-up appointment in 6 months.

## 2010-09-04 NOTE — Letter (Signed)
Summary: Patient Notice- Polyp Results   Gastroenterology  [address] [place], [place] [address]   Phone: [phone]  Fax: [phone]        February 12, 2008 MRN: [id number]    TAQUANDA ARNAUD [address] [place], [place]  [address]    Dear Ms. Claudina Lick,  I am pleased to inform you that the colon polyp(s) removed during your recent colonoscopy was (were) found to be benign (no cancer detected) upon pathologic examination.  I recommend you have a repeat colonoscopy examination in 3 years to look for recurrent polyps, as having colon polyps increases your risk for having recurrent polyps or even colon cancer in the future.  Should you develop new or worsening symptoms of abdominal pain, bowel habit changes or bleeding from the rectum or bowels, please schedule an evaluation with either your primary care physician or with me.  Please keep your follow-up visit as already scheduled.  Continue treatment plan as outlined the day of your exam.  Please call us if you are having persistent problems or have questions about your condition that have not been fully answered at this time.  Sincerely,  Meryl Dare MD [facility]  This letter has been electronically signed by your physician.  Appended Document: Patient Notice- Polyp Results letter mailed recall entered in [facility]

## 2010-09-04 NOTE — Assessment & Plan Note (Signed)
Summary: 6 months f/u w/ fast labs///kp   Primary Care Provider:  Alroy Dust MD  CC:  6 month ROV & review of mult medical problems....  History of Present Illness: 77 y/o BF here for a 6 month follow up visit...  she has multiple medical problems as noted below...   ~  she developed an osteomyelitis of the right mandible related to a tooth abcess and path showed bact colonies consistant w/ Actinomyces. (?any culture data?)...oral surg= DrRehm & seen by ID- DrFitzgerald and was treated w/ Clindamycin (due to Pen allergy)... notes reviewed... off meds now & improved.  ~  Jul09:  she was hosp briefly by GI for lower GI bleed- underwent EGD & Colon by DrStark (see below)... Nexium increased to Bid...   ~  February 27, 2009:  6 month ROV doing satis by her hx... saw DrFitzgerald for ID 4/10 & DrRehm for OralSurg- resolved osteomyelitis of maxilla after Clinda Rx-"they released me"... she notes decr hearing and we will refer to ENT for eval... also notes mild cough/ phlegm & we discussed Rx w/ Mucinex & Fluids...     Current Problem List:              **NOTE: long hx of chronic non-compliance w/ Med Rx...  CHRONIC OSTEOMYELITIS OTHER SPECIFIED SITES (ICD-730.18) - SEE ABOVE--- she stopped the Clindamycin on her own... see above- last seen by DrFitzgerald 4/10 (34mo off Clinda) and doing satis- he released her... still follows up w/ DrRehm for OralSurg...  OBSTRUCTIVE SLEEP APNEA (ICD-327.23) - mod OSA w/ sleep study 8/04 showing RDI=23, consult DrClance... Rx CPAP 10... states she is using CPAP regularly and tol well...  BRONCHITIS, RECURRENT (ICD-491.9) - no recent upper resp infection...  HYPERTENSION (ICD-401.9) - controlled on TOPROL XL 50mg /d, PLENDIL 5mg /d, HYZAAR 100/25 daily... BP=134/80 and asymptomatic- denies HA, fatigue, visual changes, CP, palipit, dizziness, syncope, dyspnea, edema, etc... she has a long hx of chr noncompliance w/ med Rx...  CAD (ICD-414.00) - non-obstructive disease  w/ cath 1/06 showing 30% focal stenosis in CIRC... NuclearStressTest 9/07 showed apical thinning, no ischemia, EF=69%... followed by Walker Kehr- last seen 9/09 & his note is reviewed...  HYPERCHOLESTEROLEMIA (ICD-272.0) - on LIPITOR 10mg /d...   ~  FLP 8/08 w/ TChol 174, TG 76, HDL 65, LDL 94  ~  FLP 7/09 showed TChol 173, TG 76, HDL 57, LDL 101  ~  FLP 7/10 showed TChol 174, TG 94, HDL 57, LDL 99  Hx of THYROID NODULE (ICD-241.0) - she has multinodular thyroid, no palp nodules, clinically euthyroid & doing well...   ~  labs 8/08 showed TSH = 0.87  ~  labs 7/09 showed TSH = 0.70  ~  labs 7/10 showed TSH= 1.12  HIATAL HERNIA (ICD-553.3) - on NEXIUM 40mg /d... HH on prev CTscans... EGD in 1998 w/ distal esoph stricture dilated... no recurrent dysphagia etc...  ~  last EGD 7/09 by DrStark showed 4cmHH, GERD, gastritis & gastic polyps- neg HPylori... Rx w/ NEXIUM 40mg Bid...  DIVERTICULOSIS OF COLON (ICD-562.10) - colonoscopy 5/05 by DrStark w/ several sm 3-4 mm polyps, divertics, & f/u planned 51yrs... hosp in 2006 w/ divertic bleed that resolved spont... repeat hosp and f/u colonoscopy 7/09 showed divertics, and several 4-42mm adenomatous polyps...  DEGENERATIVE JOINT DISEASE (ICD-715.90) - followed by DrDean... S/P L TKR in 2004... known CSpine and L/S spine disease... s/p recent epid steroid shot in lower back w/ improvement... we refilled her MOBIC 7.5mg  for as needed use.  BACK PAIN, LUMBAR (ICD-724.2)  VITAMIN D DEFICIENCY (ICD-268.9) - Vit D level Jul09 = 19 & started on Vit D 50K weekly...  ~  labs 7/10 showed Vit d level = 33... OK to switch to OTC Vit D 1000 u daily...  ABNORMALITY OF GAIT (ICD-781.2)  ANXIETY (ICD-300.00) - she is under the care of DrPlovsky for Psychiatry on BUPROPION XL 150mg - 2Qam,  CELEXA 40mg /d,  LORAZEPAM 0.5mg  as needed... she states her memory was poor and she couldn't focus & the Bupropion has helped...  ANEMIA (ICD-285.9) - after her diverticular bleed in  2006... on Fe therapy w/ resolution...  ~  labs 8/08 showed Hg= 12.8  ~  labs 7/10 showed Hg= 13.2    Current Medications (verified): 1)  Toprol Xl 50 Mg  Tb24 (Metoprolol Succinate) .... Take 1 Tablet By Mouth Once A Day 2)  Felodipine 5 Mg  Tb24 (Felodipine) .[name].. 1 Tab By Mouth Daily.Marland KitchenMarland Kitchen 3)  Hyzaar 100-25 Mg Tabs (Losartan Potassium-Hctz) .... Take 1 Tablet By Mouth Once A Day 4)  Lipitor 10 Mg Tabs (Atorvastatin Calcium) .... Take 1 Tablet By Mouth Once A Day 5)  Nexium 40 Mg  Cpdr (Esomeprazole Magnesium) .... Take 1 Tab By Mouth Two Times A Day As Directed... 6)  Glycopyrrolate 2 Mg Tabs (Glycopyrrolate) .... Take One Tablet By Mouth Two Times A Day 7)  Hydrocodone-Acetaminophen 5-500 Mg Tabs (Hydrocodone-Acetaminophen) .... Take One Tablet By Mouth Every 4-6 Hours As Needed For Pain 8)  Mobic 7.5 Mg  Tabs (Meloxicam) .... Take 1 Tab By Mouth Once Daily As Needed For Arthritis Pain... 9)  Soma 250 Mg Tabs (Carisoprodol) .... Take 1 Tab By Mouth Three Times A Day As Needed For Muscle Spasm... 10)  Multivitamins   Tabs (Multiple Vitamin) .... Take 1 Tablet By Mouth Once A Day 11)  Vitamin D 16109 Unit  Caps (Ergocalciferol) .... Take One Tablet By Mouth Every Week 12)  Bupropion Hcl 100 Mg Xr12h-Tab (Bupropion Hcl) .... Take One Tablet By Mouth Every Morning For 1 Week Then 2 Tablets By Mouth Every Morning 13)  Celexa 40 Mg  Tabs (Citalopram Hydrobromide) .... One Tablet By Mouth Once Daily 14)  Ativan 0.5 Mg  Tabs (Lorazepam) .... 1/2 To 1 Tab By Mouth Three Times A Day As Needed For Nerves... 15)  Effexor Xr 75 Mg Xr24h-Cap (Venlafaxine Hcl) .... Take 2 Tablet By Mouth Every Morning 16)  Timolol .... One Drop Each Eye Two Times A Day  Allergies (verified): 1)  ! Penicillin 2)  ! Keflex 3)  Amoxicillin (Amoxicillin)  Past History:  Past Medical History: GLAUCOMA (ICD-365.9) CHRONIC OSTEOMYELITIS OTHER SPECIFIED SITES (ICD-730.18) OBSTRUCTIVE SLEEP APNEA (ICD-327.23) BRONCHITIS,  RECURRENT (ICD-491.9) HYPERTENSION (ICD-401.9) CAD (ICD-414.00) HYPERCHOLESTEROLEMIA (ICD-272.0) Hx of THYROID NODULE (ICD-241.0) HIATAL HERNIA (ICD-553.3) GERD (ICD-530.81) DIVERTICULOSIS OF COLON (ICD-562.10) DIVERTICULAR BLEEDING, HX OF (ICD-V12.79) COLONIC POLYPS, ADENOMATOUS, HX OF (ICD-V12.72) DEGENERATIVE JOINT DISEASE (ICD-715.90) BACK PAIN, LUMBAR (ICD-724.2) VITAMIN D DEFICIENCY (ICD-268.9) ABNORMALITY OF GAIT (ICD-781.2) ANXIETY (ICD-300.00) ANEMIA (ICD-285.9)  Past Surgical History: Appendectomy Cholecystectomy Hysterectomy S/P cataract surg S/P R TKR  Family History: Reviewed history from 03/01/2008 and no changes required. mother deceased age 43 from accident father unknown no siblings  Social History: Reviewed history from 03/10/2008 and no changes required. retired used to work as Barista alone.  no tob etoh widowed Daily Caffeine Use  Review of Systems      See HPI       The patient complains of dyspnea on exertion and difficulty walking.  The  patient denies anorexia, fever, weight loss, weight gain, vision loss, decreased hearing, hoarseness, chest pain, syncope, peripheral edema, prolonged cough, headaches, hemoptysis, abdominal pain, melena, hematochezia, severe indigestion/heartburn, hematuria, incontinence, muscle weakness, suspicious skin lesions, transient blindness, depression, unusual weight change, abnormal bleeding, enlarged lymph nodes, and angioedema.    Vital Signs:  Patient profile:   77 year old female Menstrual status:  postmenopausal Height:      65.5 inches Weight:      191.25 pounds O2 Sat:      98 % on Room air Temp:     99.0 degrees F oral Pulse rate:   60 / minute BP sitting:   134 / 80  (left arm) Cuff size:   regular  Vitals Entered By: Zackery Barefoot CMA (February 27, 2009 9:04 AM)  O2 Flow:  Room air CC: 6 month ROV & review of mult medical problems... Comments Medications reviewed with patient Zackery Barefoot CMA  February 27, 2009 9:04 AM    Physical Exam  Additional Exam:  WD, Overweight, 77 y/o BF in NAD... wt= 191# stable over the last several months... GENERAL:  Alert, pleasant & cooperative... I did not do formal MMSE... HEENT:  [place]/AT, EOM-wnl, PERRLA, EACs-clear, TMs-wnl, NOSE-clear, THROAT-clear & wnl, JAW- no pain or tenderness. NECK:  Supple w/ fairROM; no JVD; normal carotid impulses w/o bruits; no thyromegaly or nodules palpated (cannot feel the multinod goiter), no lymphadenopathy. CHEST:  Clear to P & A; without wheezes/ rales/ or rhonchi heard... HEART:  Regular Rhythm; without murmurs/ rubs/ or gallops detected... ABDOMEN:  Soft & nontender; normal bowel sounds; no organomegaly or masses palpated... EXT:  s/p TKR, mod arthritic changes; no varicose veins/ +venous insuffic/ tr edema. NEURO:  CN's intact; no focal neuro deficits... DERM:  No lesions noted; no rash etc...     MISC. Report  Procedure date:  02/27/2009  Findings:      Tests: (1) Lipid Panel (LIPID)   Cholesterol               174 mg/dL                   1-610   Triglycerides             94.0 mg/dL                  9.6-045.4   HDL                       09.81 mg/dL                 >19.14   VLDL Cholesterol          18.8 mg/dL                  7.8-29.5   LDL Cholesterol           99 mg/dL                    6-21  Tests: (2) BMP (METABOL)   Sodium                    141 mEq/L                   135-145   Potassium                 4.0 mEq/L  3.5-5.1   Chloride                  103 mEq/L                   96-112   Carbon Dioxide            31 mEq/L                    19-32   Glucose              [H]  100 mg/dL                   16-10   BUN                       19 mg/dL                    9-60   Creatinine                1.1 mg/dL                   4.5-4.0   Calcium                   9.8 mg/dL                   9.8-11.9   GFR                       61.57 mL/min                >60  Tests:  (3) CBC Platelet w/Diff (CBCD)   White Cell Count          5.8 K/uL                    4.5-10.5   Red Cell Count            4.51 Mil/uL                 3.87-5.11   Hemoglobin                13.2 g/dL                   14.7-82.9   Hematocrit                39.9 %                      36.0-46.0   MCV                       88.4 fl                     78.0-100.0   MCHC                      33.1 g/dL                   56.2-13.0   RDW                       14.0 %                      11.5-14.6   Platelet Count  218.0 K/uL                  150.0-400.0   Neutrophil %              69.5 %                      43.0-77.0   Lymphocyte %              22.3 %                      12.0-46.0   Monocyte %                6.8 %                       3.0-12.0   Eosinophils%              1.2 %                       0.0-5.0   Basophils %               0.2 %                       0.0-3.0  Comments:      ests: (4) Hepatic/Liver Function Panel (HEPATIC)   Total Bilirubin      [H]  1.6 mg/dL                   1.6-1.0   Direct Bilirubin          0.2 mg/dL                   9.6-0.4   Alkaline Phosphatase      62 U/L                      39-117   AST                       27 U/L                      0-37   ALT                       12 U/L                      0-35   Total Protein             7.4 g/dL                    5.4-0.9   Albumin                   3.7 g/dL                    8.1-1.9  Tests: (5) TSH (TSH)   FastTSH                   1.12 uIU/mL                 0.35-5.50  Tests: (6) Vitamin D (25-Hydroxy) (14782)  Vitamin D (25-Hydroxy)  33 ng/mL                    30-89  Impression & Recommendations:  Problem # 1:  CHRONIC OSTEOMYELITIS OTHER SPECIFIED SITES (ICD-730.18) Resolved & still followed by DrRehm for oral surg...  Orders: Venipuncture (16109) TLB-Lipid Panel (80061-LIPID) TLB-BMP (Basic Metabolic Panel-BMET) (80048-METABOL) TLB-CBC Platelet -  w/Differential (85025-CBCD) TLB-Hepatic/Liver Function Pnl (80076-HEPATIC) TLB-TSH (Thyroid Stimulating Hormone) (84443-TSH) T-Vitamin D (25-Hydroxy) (60454-09811)  Problem # 2:  OBSTRUCTIVE SLEEP APNEA (ICD-327.23) Stable-  continue CAP.  Problem # 3:  HYPERTENSION (ICD-401.9) Controlled-  reminded to take meds regularly. Her updated medication list for this problem includes:    Toprol Xl 50 Mg Tb24 (Metoprolol succinate) .[name]... Take 1 tablet by mouth once a day    Felodipine 5 Mg Tb24 (Felodipine) .[name]... 1 tab by mouth daily...    Hyzaar 100-25 Mg Tabs (Losartan potassium-hctz) .[name]... Take 1 tablet by mouth once a day  Problem # 4:  CAD (ICD-414.00) Followed by Walker Kehr & stable. Her updated medication list for this problem includes:    Toprol Xl 50 Mg Tb24 (Metoprolol succinate) .[name]... Take 1 tablet by mouth once a day    Felodipine 5 Mg Tb24 (Felodipine) .[name]... 1 tab by mouth daily...    Hyzaar 100-25 Mg Tabs (Losartan potassium-hctz) .[name]... Take 1 tablet by mouth once a day  Problem # 5:  HYPERCHOLESTEROLEMIA (ICD-272.0) FLP looks good-  continue same med. Her updated medication list for this problem includes:    Lipitor 10 Mg Tabs (Atorvastatin calcium) .[name]... Take 1 tablet by mouth once a day  Orders: Venipuncture (91478) TLB-Lipid Panel (80061-LIPID) TLB-BMP (Basic Metabolic Panel-BMET) (80048-METABOL) TLB-CBC Platelet - w/Differential (85025-CBCD) TLB-Hepatic/Liver Function Pnl (80076-HEPATIC) TLB-TSH (Thyroid Stimulating Hormone) (84443-TSH) T-Vitamin D (25-Hydroxy) (29562-13086)  Problem # 6:  Hx of THYROID NODULE (ICD-241.0) TFT's remain stable...  Problem # 7:  GERD (ICD-530.81) GI is stable on the Nexium. The following medications were removed from the medication list:    Glycopyrrolate 2 Mg Tabs (Glycopyrrolate) .[name]... Take one tablet by mouth two times a day Her updated medication list for this problem includes:    Nexium 40 Mg Cpdr (Esomeprazole magnesium) .[name]...  Take 1 tab by mouth daily...  Problem # 8:  VITAMIN D DEFICIENCY (ICD-268.9) Vit D level improved to 33-  OK to change to 1000 u OTC daily.  Problem # 9:  OTHER MEDICAL PROBLEMS AS NOTED...  Complete Medication List: 1)  Toprol Xl 50 Mg Tb24 (Metoprolol succinate) .... Take 1 tablet by mouth once a day 2)  Felodipine 5 Mg Tb24 (Felodipine) .[name].. 1 tab by mouth daily.Marland KitchenMarland Kitchen 3)  Hyzaar 100-25 Mg Tabs (Losartan potassium-hctz) .... Take 1 tablet by mouth once a day 4)  Lipitor 10 Mg Tabs (Atorvastatin calcium) .... Take 1 tablet by mouth once a day 5)  Nexium 40 Mg Cpdr (Esomeprazole magnesium) .... Take 1 tab by mouth daily.Marland KitchenMarland Kitchen 6)  Mobic 7.5 Mg Tabs (Meloxicam) .... Take 1 tab by mouth once daily as needed for arthritis pain.Marland KitchenMarland Kitchen 7)  Multivitamins Tabs (Multiple vitamin) .... Take 1 tablet by mouth once a day 8)  Vitamin D 57846 Unit Caps (Ergocalciferol) .... Take one tablet by mouth every week 9)  Effexor Xr 75 Mg Xr24h-cap (Venlafaxine hcl) .... Take 2 tablet by mouth every morning 10)  Ativan 0.5 Mg Tabs (Lorazepam) .... 1/2 to 1 tab by mouth three times a day as needed for nerves...  Other Orders: ENT Referral (ENT)  Patient Instructions: 1)  Today we updated your med list- see below.... 2)  We can fill all needed perscriptions electronically- have your pharmacist call us when refills are needed... 3)  Today we did your follow up FASTING blood work... please call the "phone tree" in a few days for your lab results.Marland KitchenMarland Kitchen 4)  We will arrange for an ENT referral to check your hearing... 5)  For your cough: try MUCINEX OTC- 1 to 2 tabs twice daily w/ plenty of fluids.Marland KitchenMarland Kitchen 6)  Don't forget to take your meds regularly every day, and check your BP at home once or twice a week & record the results.Marland KitchenMarland Kitchen 7)  Call for any questions... 8)  Please schedule a follow-up appointment in 6 months.

## 2010-09-04 NOTE — Procedures (Signed)
Summary: Endoscopy   EGD  Procedure date:  02/11/2008  Findings:      Location: [facility]    Patient Name: [name], [name] MRN: [id number] Procedure Procedures: Panendoscopy (EGD) CPT: 43235.    with biopsy(s)/brushing(s). CPT: D1846139.    with PolypectomyCPT: 43251.  Personnel: Endoscopist: Venita Lick. [name], MD, Clementeen Graham.  Exam Location: Exam performed in Endoscopy Suite. [facility]  Patient Consent: Procedure, Alternatives, Risks and Benefits discussed, consent obtained, from patient. Consent was obtained by the RN.  Indications Symptoms: Abdominal pain, location: diffuse.  History  Current Medications: Patient is not currently taking Coumadin.  Pre-Exam Physical: Performed Feb 11, 2008  Cardio-pulmonary exam, HEENT exam, Abdominal exam, Mental status exam WNL.  Comments: Pt. history reviewed/updated, physical exam performed prior to initiation of sedation?Yes Exam Exam Info: Maximum depth of insertion Duodenum, intended Duodenum. Vocal cords not visualized. Gastric retroflexion performed. Images taken. ASA Classification: II. Tolerance: excellent.  Sedation Meds: Patient assessed and found to be appropriate for moderate (conscious) sedation. Residual sedation present from prior procedure today. Fentanyl 25 mcg. given IV. Versed 2 mg. given IV. Cetacaine Spray 2 sprays given aerosolized.  Monitoring: BP and pulse monitoring done. Oximetry used. Supplemental O2 given  Findings Normal: Proximal Esophagus to Distal Esophagus.  HIATAL HERNIA: Regular, 4 cms. in length. ICD9: Hernia, Hiatal: 553.3. OTHER FINDING: Multiple polyps in Body. Comments: About 20 polyps measuring 3-73mm each. The largest 6 were removed by cold snare polypectomy - R/O fundic gland polyps.  MUCOSAL ABNORMALITY: Antrum to Pyloric Sphincter. Erythematous mucosa. Granular mucosa. Biopsy/Mucosal Abn taken. ICD9: Gastritis, Unspecified: 535.50.  Normal: Duodenal Bulb to Duodenal 2nd  Portion.   Assessment  Diagnoses: 535.50: Gastritis, Unspecified.  553.3: Hernia, Hiatal.  211.1: Neoplasia, Benign, Stomach.  530.81: GERD.   Events  Unplanned Intervention: No unplanned interventions were required.  Unplanned Events: There were no complications. Plans Medication(s): Await pathology. Other: Robinul 2mg  BID,  PPI: BID,   Patient Education: Patient given standard instructions for: Hiatal Hernia. Reflux. Mucosal Abnormality. Polyps.  Disposition: After procedure patient sent to recovery. After recovery patient sent back to hospital.  Scheduling: Primary Care Provider, to [facility]. Kriste Basque, MD, as planned  Office Visit, to [employer]. [name], MD, [facility], around Mar 13, 2008.     cc: Lonzo Cloud. Kriste Basque, MD   This report was created from the original endoscopy report, which was reviewed and signed by the above listed endoscopist.

## 2010-09-04 NOTE — Progress Notes (Signed)
  Phone Note Call from Patient   Summary of Call: Pt came by the office today to discuss her labs from 02/16/10... she was concerned because of a phone call she received after the labs returned that mentioned her liver enzymes...  LFT's weren't done 7/15 because she didn't need them checked at that time (last LFTs 1/11 were normnal & had always been normal) & she tolerates the Lipitor 10mg  very well xyrs...  she was relieved to know that there is no prob w/ her liver enzymes & I apologiezed for any misunderstanding that occured... she will ret for her f/u appt in Jan2012 & we will do her full fasting labs at that time...  SN Initial call taken by: Michele Mcalpine MD,  May 23, 2010 2:57 PM

## 2010-09-04 NOTE — Progress Notes (Signed)
Summary: questions about results  Phone Note Call from Patient   Caller: Patient Call For: nadel Summary of Call: questions about labs Initial call taken by: Rickard Patience,  February 21, 2010 9:23 AM  Follow-up for Phone Call        called spoke with pt.  she expressed concern about why her liver function needs to be checked at next ov.  informed patient that her liver function is chaecked every year by SN simply to make sure that everything "is okay."  and her next ov in Jan will be her yearly appt, therefore time for this routine lab.  informed pt that her last liver function was normal and we need to make sure that it continues to be so.  pt verbalized her understanding. Boone Master CNA/MA  February 21, 2010 9:45 AM

## 2010-09-04 NOTE — Medication Information (Signed)
Summary: NEXIUM approved til 08/08/2009/[employer]  NEXIUM approved til 08/08/2009/[employer]   Imported By: Lester Frederickson 08/10/2008 10:10:33  _____________________________________________________________________  External Attachment:    Type:   Image     Comment:   External Document

## 2010-09-04 NOTE — Progress Notes (Signed)
Summary: medication  Phone Note Call from Patient   Caller: Patient Call For: nadel Summary of Call: hyzaar is too expensive would like generic or something cheaper [facility] [address] Initial call taken by: Rickard Patience,  October 24, 2009 1:39 PM  Follow-up for Phone Call        pls ask sn for something to change to that would be cheaper for pt  Philipp [facility]  October 24, 2009 3:32 PM    per SN---ok to change the hyzaar to the generic losartan/hctz  100/25mg  that will only cost her 10.00 monthly.  pt is aware that this has been changed by the pharmacy Randell Loop CMA  October 24, 2009 5:24 PM     New/Updated Medications: HYZAAR 100-25 MG TABS (LOSARTAN POTASSIUM-HCTZ) Take 1 tablet by mouth once a day

## 2010-09-04 NOTE — Assessment & Plan Note (Signed)
Summary: rov 6 months///kp   PCP:  Alroy Dust MD  Chief Complaint:  6 month ROV....  History of Present Illness: 77 y/o BF here for a 6 month follow up visit...  she has multiple medical problems as noted below...   ~  she developed an osteomyelitis of the right mandible related to a tooth abcess and path showed bact colonies consistant w/ Actinomyces. (?any culture data?)...oral surg= DrRehm & seen by ID- DrFitzgerald and was treated w/ Clindamycin (due to Pen allergy)... notes reviewed...  ~  Jul09:  she was hosp briefly by GI for lower GI bleed- underwent EGD & Colon by DrStark (see below)... Nexium increased to Bid...  ~  Aug09:  f/u w/ GI DrStark- stable, no recurrent bleeding.  ~  Sep09:  f/u w/ Cards DrNishan- stable, same meds...  ~  Nov09:  f/u w/ ID DrFitzgerald- hx tooth abscess w/ chr osteomyelitis (?actino) on Clindamycin... he rec continued Qid Clinda but pt stopped the antibiotic and had f/u w/ DrRehm (he is aware)... she denies symptoms of pain, drainage, etc...  NOTE: on questioning her about her meds- it is apparent that she has no clue about her meds!!! I called her [facility] on [address] and they say she hasn't filled BP meds since Dec08... when I asked the pt she seemed befuddled and said "I'll have to check my bottles"... she has been requested to bring all her bottles to every visit--- obvious implications for her osteomyelitis if she not taking her antibiotic properly...    Current Problem List:  CHRONIC OSTEOMYELITIS OTHER SPECIFIED SITES (ICD-730.18) - SEE ABOVE--- she stopped the Clindamycin on her own...   OBSTRUCTIVE SLEEP APNEA (ICD-327.23) - mod OSA w/ sleep study 8/04 showing RDI=23, consult DrClance... Rx CPAP 10... states she is using CPAP regularly and tol well...  BRONCHITIS, RECURRENT (ICD-491.9) - no recent upper resp infection...  HYPERTENSION (ICD-401.9) - controlled on TOPROL XL 50mg /d, PLENDIL 5mg /d, HYZAAR 100/25 daily... BP=130/78 and  asymptomatic- denies HA, fatigue, visual changes, CP, palipit, dizziness, syncope, dyspnea, edema, etc...  CAD (ICD-414.00) - non-obstructive disease w/ cath 1/06 showing 30% focal stenosis in CIRC... NuclearStressTest 9/07 showed apical thinning, no ischemia, EF=69%... followed by Walker Kehr- last seen 9/09 & his note is reviewed...  HYPERCHOLESTEROLEMIA (ICD-272.0) - on LIPITOR 10mg /d...   ~  FLP 8/08 w/ TChol 174, TG 76, HDL 65, LDL 94...  ~  FLP 7/09 showed TChol 173, TG 76, HDL 57, LDL 101...  Hx of THYROID NODULE (ICD-241.0) - she has multinodular thyroid, no palp nodules, clinically euthyroid & doing well...   ~  labs 8/08 showed TSH = 0.87...  ~  labs 7/09 showed TSH = 0.70  HIATAL HERNIA (ICD-553.3) - HH on prev CTscans... EGD in 1998 w/ distal esoph stricture dilated... no recurrent dysphagia etc...  ~  last EGD 7/09 by DrStark showed 4cmHH, GERD, gastritis & gastic polyps- neg HPylori... Rx w/ NEXIUM 40mg Bid...  DIVERTICULOSIS OF COLON (ICD-562.10) - colonoscopy 5/05 by DrStark w/ several sm 3-4 mm polyps, divertics, & f/u planned 33yrs... hosp in 2006 w/ divertic bleed that resolved spont... repeat hosp and f/u colonoscopy 7/09 showed divertics, and several 4-54mm adenomatous polyps...  DEGENERATIVE JOINT DISEASE (ICD-715.90) - followed by DrDean... S/P L TKR in 2004... known CSpine and L/S spine disease... s/p recent epid steroid shot in lower back w/ improvement... we refilled her MOBIC 7.5mg  for as needed use.  BACK PAIN, LUMBAR (ICD-724.2)  VITAMIN D DEFICIENCY (ICD-268.9) - Vit D level Jul09 =  19 & started on Vit D 50K weekly...  ABNORMALITY OF GAIT (ICD-781.2)  ANXIETY (ICD-300.00) - she is under the care of DrPlovsky for Psychiatry on BUPROPION XL 150mg - 2Qam,  CELEXA 40mg /d,  LORAZEPAM 0.5mg  as needed... she states her memory was poor and she couldn't focus & the Bupropion has helped...  ANEMIA (ICD-285.9) - after her diverticular bleed in 2006... on Fe therapy w/  resolution... last Hg 8/08 was 12.8.Marland KitchenMarland Kitchen      Current Allergies (reviewed today): ! PENICILLIN ! KEFLEX AMOXICILLIN (AMOXICILLIN)  Past Medical History:        GLAUCOMA (ICD-365.9)    CHRONIC OSTEOMYELITIS OTHER SPECIFIED SITES (ICD-730.18)    OBSTRUCTIVE SLEEP APNEA (ICD-327.23)    BRONCHITIS, RECURRENT (ICD-491.9)    HYPERTENSION (ICD-401.9)    CAD (ICD-414.00)    HYPERCHOLESTEROLEMIA (ICD-272.0)    Hx of THYROID NODULE (ICD-241.0)    HIATAL HERNIA (ICD-553.3)    GERD (ICD-530.81)    DIVERTICULOSIS OF COLON (ICD-562.10)    DIVERTICULAR BLEEDING, HX OF (ICD-V12.79)    COLONIC POLYPS, ADENOMATOUS, HX OF (ICD-V12.72)    DEGENERATIVE JOINT DISEASE (ICD-715.90)    BACK PAIN, LUMBAR (ICD-724.2)    VITAMIN D DEFICIENCY (ICD-268.9)    ABNORMALITY OF GAIT (ICD-781.2)    ANXIETY (ICD-300.00)    ANEMIA (ICD-285.9)      Past Surgical History:        Appendectomy    Cholecystectomy    Hysterectomy    S/P cataract surg    S/P R TKR       Family History:    Reviewed history from 03/01/2008 and no changes required:       mother deceased age 29 from accident       father unknown       no siblings  Social History:    Reviewed history from 03/10/2008 and no changes required:       retired       used to work as Arboriculturist alone.        no tob etoh       widowed       Daily Caffeine Use   Risk Factors:  Tobacco use:  never    Vital Signs:  Patient Profile:   77 Years Old Female Height:     65.5 inches (166.37 cm) Weight:      194.50 pounds O2 Sat:      98 % O2 treatment:    Room Air Temp:     98.7 degrees F oral Pulse rate:   62 / minute BP sitting:   130 / 78  (left arm) Cuff size:   regular  Vitals Entered By: Marijo File CMA (August 29, 2008 11:30 AM)             Comments pt brought all meds today     Physical Exam  WD, Overweight, 77 y/o BF in NAD... wt= 195# stable over the last several months... GENERAL:  Alert, pleasant &  cooperative... I did not do formal MMSE... HEENT:  [place]/AT, EOM-wnl, PERRLA, EACs-clear, TMs-wnl, NOSE-clear, THROAT-clear & wnl, JAW- no pain or tenderness. NECK:  Supple w/ fairROM; no JVD; normal carotid impulses w/o bruits; no thyromegaly or nodules palpated (cannot feel the multinod goiter), no lymphadenopathy. CHEST:  Clear to P & A; without wheezes/ rales/ or rhonchi heard... HEART:  Regular Rhythm; without murmurs/ rubs/ or gallops detected... ABDOMEN:  Soft & nontender; normal bowel sounds; no organomegaly or masses palpated... EXT:  s/p TKR, mod arthritic changes; no varicose veins/ +venous insuffic/ tr edema. NEURO:  CN's intact; no focal neuro deficits... DERM:  No lesions noted; no rash etc...        Impression & Recommendations:  Problem # 1:  CHRONIC OSTEOMYELITIS OTHER SPECIFIED SITES (ICD-730.18) She is followed closely by DrRehm & DrFitzgerald... continue w/ their management of this difficult infectious problem...  Problem # 2:  HYPERTENSION (ICD-401.9) Controlled-  she is doing better w/ her meds... continue Rx. Her updated medication list for this problem includes:    Toprol Xl 50 Mg Tb24 (Metoprolol succinate) .[name]... Take 1 tablet by mouth once a day    Felodipine 5 Mg Tb24 (Felodipine) .[name]... 1 tab by mouth daily...    Hyzaar 100-25 Mg Tabs (Losartan potassium-hctz) .[name]... Take 1 tablet by mouth once a day   Problem # 3:  CAD (ICD-414.00) Stable- no recurrent CP etc... Her updated medication list for this problem includes:    Toprol Xl 50 Mg Tb24 (Metoprolol succinate) .[name]... Take 1 tablet by mouth once a day    Felodipine 5 Mg Tb24 (Felodipine) .[name]... 1 tab by mouth daily...    Hyzaar 100-25 Mg Tabs (Losartan potassium-hctz) .[name]... Take 1 tablet by mouth once a day   Problem # 4:  HYPERCHOLESTEROLEMIA (ICD-272.0) Stable-  same med. Her updated medication list for this problem includes:    Lipitor 10 Mg Tabs (Atorvastatin calcium) .[name]... Take 1 tablet by mouth  once a day   Problem # 5:  DIVERTICULOSIS OF COLON (ICD-562.10) No further bleeding apparent... appears stable...  Problem # 6:  DEGENERATIVE JOINT DISEASE (ICD-715.90) She has DJD, LBP, etc... we will refill the Mobic, she has Herbalist for Prn use. Her updated medication list for this problem includes:    Hydrocodone-acetaminophen 5-500 Mg Tabs (Hydrocodone-acetaminophen) .[name]... Take one tablet by mouth every 4-6 hours as needed for pain    Mobic 7.5 Mg Tabs (Meloxicam) .[name]... Take 1 tab by mouth once daily as needed for arthritis pain...   Problem # 7:  ANXIETY (ICD-300.00) She will continue the complex regimen from DrPlovsky... Her updated medication list for this problem includes:    Bupropion Hcl 100 Mg Xr12h-tab (Bupropion hcl) .[name]... Take one tablet by mouth every morning for 1 week then 2 tablets by mouth every morning    Celexa 40 Mg Tabs (Citalopram hydrobromide) ..... One tablet by mouth once daily    Ativan 0.5 Mg Tabs (Lorazepam) .[name]... 1/2 to 1 tab by mouth three times a day as needed for nerves...   Complete Medication List: 1)  Toprol Xl 50 Mg Tb24 (Metoprolol succinate) .... Take 1 tablet by mouth once a day 2)  Felodipine 5 Mg Tb24 (Felodipine) .[name].. 1 tab by mouth daily.Marland KitchenMarland Kitchen 3)  Hyzaar 100-25 Mg Tabs (Losartan potassium-hctz) .... Take 1 tablet by mouth once a day 4)  Lipitor 10 Mg Tabs (Atorvastatin calcium) .... Take 1 tablet by mouth once a day 5)  Nexium 40 Mg Cpdr (Esomeprazole magnesium) .... Take 1 tab by mouth two times a day as directed... 6)  Glycopyrrolate 2 Mg Tabs (Glycopyrrolate) .... Take one tablet by mouth two times a day 7)  Hydrocodone-acetaminophen 5-500 Mg Tabs (Hydrocodone-acetaminophen) .... Take one tablet by mouth every 4-6 hours as needed for pain 8)  Mobic 7.5 Mg Tabs (Meloxicam) .... Take 1 tab by mouth once daily as needed for arthritis pain.Marland KitchenMarland Kitchen 9)  Soma 250 Mg Tabs (Carisoprodol) .... Take 1 tab by mouth three times a day  as needed for muscle  spasm... 10)  Multivitamins Tabs (Multiple vitamin) .... Take 1 tablet by mouth once a day 11)  Vitamin D 16109 Unit Caps (Ergocalciferol) .... Take one tablet by mouth every week 12)  Bupropion Hcl 100 Mg Xr12h-tab (Bupropion hcl) .... Take one tablet by mouth every morning for 1 week then 2 tablets by mouth every morning 13)  Celexa 40 Mg Tabs (Citalopram hydrobromide) .... One tablet by mouth once daily 14)  Ativan 0.5 Mg Tabs (Lorazepam) .... 1/2 to 1 tab by mouth three times a day as needed for nerves...   Patient Instructions: 1)  Today we updated your med list- see below.... 2)  We wrote a new perscription for the [facility] to taske for your arthritis pain.Marland KitchenMarland Kitchen 3)  Stay as active as possible...  4)  Continue your follow up w/ DrRehm & DrFitzgerald... 5)  Call for any problems.Marland KitchenMarland Kitchen 6)  Please schedule a follow-up appointment in 6 months, and we will plan FASTING blood work at that time...   Prescriptions: MOBIC 7.5 MG  TABS (MELOXICAM) take 1 tab by mouth once daily as needed for arthritis pain...  #30 x prn   Entered and Authorized by:   Michele Mcalpine MD   Signed by:   Michele Mcalpine MD on 08/29/2008   Method used:   Print then Give to Patient   RxID:   [id number]

## 2010-09-04 NOTE — Progress Notes (Signed)
Summary: Appt. at [facility]  Phone Note Call from Patient Call back at [facility] Phone [phone]   Caller: Patient Call For: Dr. [name] Reason for Call: Talk to Nurse Summary of Call: calling about her appt. at [facility]...requesting to speak to [facility] Initial call taken by: Karna Christmas,  July 09, 2010 9:39 AM  Follow-up for Phone Call        Pt states she would like to cancel the appointment to [facility]. I asked patient why does she want to cancel the appointment and pt proceeded to tell me that she was very disappointed in the last office visit with Dr. [name]. She states "I am not a dumb 77 year old women and Dr. [name] did not explain to me why I was going to [facility] and basically came into the room and said referral and then walked back out. She kept stating that she wants him to know how unhappy she was with the last office visit and might of went to [facility] for the study if she would of known why she was going in the first place. I tried to tell the patient the reason why she was going and she said " I wanted to hear it from him when I was there to see him in the office". Pt states she would like for me to cancel the appointment and send Dr. [name] a note to tell him how disappointed she was with the office appointment and if I do not tell him that then she will tell Dr. Kriste Basque when she sees him next week.   Additional Follow-up for Phone Call Additional follow up Details #1::        If she does not want to go to [facility] that is her choice. Her description of the interaction at her office visit with me is not accurate at all. We had a detailed discussion about fecal incontinence and its full evaluation potential requiring anorectal manometry, biofeedback and other service that we do not have in [place]. I clearly outlined all this to her and recommended evaluation and treatment at [facility] to help attempt to her incontinence and to help her improve her quality of life.  Additional  Follow-up by: Meryl Dare MD Clementeen Graham,  July 09, 2010 1:26 PM

## 2010-09-04 NOTE — Consult Note (Signed)
Summary: [facility]: Dr. Lionel December [facility]: Dr. Egbert Garibaldi   Imported By: Florinda Marker 12/29/2007 11:54:46  _____________________________________________________________________  External Attachment:    Type:   Image     Comment:   External Document

## 2010-09-04 NOTE — Consult Note (Signed)
Summary: Decreased Hearing/[facility]  Decreased Hearing/[facility]   Imported By: Sherian Rein 04/04/2009 10:45:53  _____________________________________________________________________  External Attachment:    Type:   Image     Comment:   External Document

## 2010-09-04 NOTE — Procedures (Signed)
Summary: Colonoscopy   Colonoscopy  Procedure date:  02/11/2008  Findings:      Location:  [facility].    Procedures Next Due Date:    Colonoscopy: 02/2011  Patient Name: [name], Brege MRN: [id number] Procedure Procedures: Colonoscopy CPT: 14782.    with polypectomy. CPT: A3573898.  Personnel: Endoscopist: Venita Lick. [name], MD, Clementeen Graham.  Exam Location: Exam performed in Endoscopy Suite. [facility]  Patient Consent: Procedure, Alternatives, Risks and Benefits discussed, consent obtained, from patient. Consent was obtained by the RN.  Indications Symptoms: Hematochezia.  Surveillance of: Adenomatous Polyp(s). Initial polypectomy was performed in 2002.  History  Current Medications: Patient is not currently taking Coumadin.  Pre-Exam Physical: Performed Feb 11, 2008. Cardio-pulmonary exam, Rectal exam, HEENT exam , Abdominal exam, Mental status exam WNL.  Comments: Pt. history reviewed/updated, physical exam performed prior to initiation of sedation?Yes Exam Exam: Extent of exam reached: Cecum, extent intended: Cecum.  The cecum was identified by appendiceal orifice and IC valve. Time for Withdrawl: 00:12:20. Colon retroflexion performed. Images taken. ASA Classification: II. Tolerance: excellent.  Monitoring: Pulse and BP monitoring, Oximetry used. Supplemental O2 given.  Colon Prep Used MoviPrep for colon prep. Prep results: excellent.  Sedation Meds: Patient assessed and found to be appropriate for moderate (conscious) sedation. Fentanyl 75 mcg. given IV. Versed 6 mg. given IV.  Findings MULTIPLE POLYPS: Transverse Colon to Ascending Colon. minimum size 4 mm, maximum size 5 mm. Procedure:  snare without cautery, removed, Polyp retrieved, 4 polyps Polyps sent to pathology. ICD9: Colon Polyps: 211.3.  - DIVERTICULOSIS: Ascending Colon to Sigmoid Colon. Not bleeding. ICD9: Diverticulosis: 562.10.  NORMAL EXAM: Cecum.  NORMAL EXAM: Rectum.    Assessment  Diagnoses: 562.10: Diverticulosis.  211.3: Colon Polyps.   Events  Unplanned Interventions: No intervention was required.  Plans  Post Exam Instructions: Post sedation instructions given.  Medication Plan: Await pathology.  Patient Education: Patient given standard instructions for: Polyps. Diverticulosis.  Disposition: After procedure patient sent remain in endo suite for second procedure.  Scheduling/Referral: Colonoscopy, to [facility] T. [name], MD, [facility], around Feb 11, 2011.    cc: Lonzo Cloud. Kriste Basque, MD  This report was created from the original endoscopy report, which was reviewed and signed by the above listed endoscopist.

## 2010-09-04 NOTE — Progress Notes (Signed)
Summary: results  Phone Note Call from Patient Call back at Home Phone [phone]   Caller: Patient Call For: Egon Dittus Reason for Call: Talk to Nurse, Lab or Test Results Summary of Call: pt returning call to [facility] re: lab results. Initial call taken by: Eugene Gavia,  February 20, 2010 3:13 PM  Follow-up for Phone Call        returned call to pt and she is aware of lab results per TP. Randell Loop CMA  February 20, 2010 3:22 PM

## 2010-09-06 NOTE — Assessment & Plan Note (Signed)
Summary: 6 month follow up/la   Primary Care Provider:  Alroy Dust MD  CC:  6 month ROV & review of mult medical problems....  History of Present Illness: 77 y/o BF here for a 6 month follow up visit...  she has multiple medical problems as noted below...  Followed for general medical purposes w/ hx of OSA, HBP, CAD, Hyperchol, Multinod thyroid, HH, Divertics & polyps, DJD, Anxiety, etc...   ~  ZOX09:  her CC is "tired- no energy" & we discussed checking blood work... needs to incr exercise program/ mobility, take MVI/ VitD... she saw DrNishan 11/10 for f/u HBP, palpit, Lipids> event recorder showed PACs/ PVCs only & no med changes made... she has severe DJD & bilat TKRs> uses Mobic Prn... still under the care of DrPlovsky for psyche- & remains on Wellbutrin, Celexa, Lorazepam...  ~  Jul11:  she fell recently & hurt her left hand w/ some persist tingling & she will check w/ her Ortho, DrDean... otherw stable- using CPAP & tol well;  BP controlled on meds;  no CP/ angina;  Lipids controlled on Lip10;  GI is stable;  DJD on Mobic; and nerve meds changed by DrPlovsky...    ~  August 16, 2010:  she has hx divertics/ IBS & developed some diarrhea & fecal incontinence several times per week & at night> DrStark tried to refer her to [facility] for the needed studies but she misunderstood somehow, anyway she says all symptoms better w/ regular dosing of Metamucil... she is overall improved> has a new dog= poodle mix "Keebler" & it helps her to walk... BP controlled on meds;  denies CP, palpit, SOB, etc;  f/u labs all look good;  she notes she is due for GYN check w/ DrNeal & will inquire about BMD as well...    Current Problem List  GLAUCOMA - on eye drops from DrShapiro...  HEARING LOSS - saw DrByers for hearing eval & they discussed hearing aides.  CHRONIC OSTEOMYELITIS OTHER SPECIFIED SITES (ICD-730.18) - she developed osteomyelitis of the right mandible related to a tooth abcess and path showed  bact colonies consistant w/ Actinomyces- oral surg= DrRehm & seen by ID- DrFitzgerald and was treated w/ Clindamycin (due to Pen allergy)...  she stopped the Clindamycin on her own... last seen by DrFitzgerald 4/10 (82mo off Clinda) and doing satis- he released her... still follows up w/ DrRehm for OralSurg...  OBSTRUCTIVE SLEEP APNEA (ICD-327.23) - mod OSA w/ sleep study 8/04 showing RDI=23, consult DrClance... Rx CPAP 10... states she is using CPAP regularly and tol well...  BRONCHITIS, RECURRENT (ICD-491.9) - no recent upper resp infection...  ~  CXR 1/11 showed clear lungs, WNL...  HYPERTENSION (ICD-401.9) - controlled on TOPROL XL 50mg /d, PLENDIL 5mg /d, HYZAAR 100/25 daily, & KCl Bid... BP=140/80 and asymptomatic- denies HA, fatigue, visual changes, CP, palipit, dizziness, syncope, dyspnea, edema, etc... she has a long hx of chr noncompliance w/ med Rx...  ~  labs 1/11 showed K= 3.1, BUN= 14, Creat= 1.0... rec> start K20 Bid...  ~  labs 7/11 showed K= 4.0, BUN= 21, Creat= 0.9  CAD (ICD-414.00) - non-obstructive disease w/ cath 1/06 showing 30% focal stenosis in CIRC... NuclearStressTest 9/07 showed apical thinning, no ischemia, EF=69%... followed by Walker Kehr- last seen 11/10 for f/u of her HBP, palpit, lipids> Event recoder showed benign PVCs & PACs, no change in med Rx... she can't take ASA, she says.  HYPERCHOLESTEROLEMIA (ICD-272.0) - on LIPITOR 10mg /d...   ~  FLP 8/08 w/ TChol  174, TG 76, HDL 65, LDL 94  ~  FLP 7/09 showed TChol 173, TG 76, HDL 57, LDL 101  ~  FLP 7/10 showed TChol 174, TG 94, HDL 57, LDL 99  ~  FLP 7/11 showed TChol 166, TG 90, HDL 58, LDL 91  ~  FLP 1/12 showed TChol 158, TG 91, HDL 53, LDL 86  Hx of THYROID NODULE (ICD-241.0) - she has multinodular thyroid, no palp nodules, clinically euthyroid & doing well...   ~  labs 8/08 showed TSH = 0.87  ~  labs 7/09 showed TSH = 0.70  ~  labs 7/10 showed TSH= 1.12  ~  labs 1/11 showed TSH= 1.06  ~  labs 1/12 showed  TSH= 1.53  HIATAL HERNIA (ICD-553.3) - on NEXIUM 40mg /d... HH on prev CTscans... EGD in 1998 w/ distal esoph stricture dilated... no recurrent dysphagia etc...  ~  last EGD 7/09 by DrStark showed 4cmHH, GERD, gastritis & gastic polyps- neg HPylori... Rx w/ NEXIUM 40mg Bid...  DIVERTICULOSIS OF COLON (ICD-562.10), & COLONIC POLYPS, ADENOMATOUS, HX OF (ICD-V12.72) - colonoscopy 5/05 by DrStark w/ several sm 3-4 mm polyps, divertics, & f/u planned 24yrs... hosp in 2006 w/ divertic bleed that resolved spont... repeat hosp and f/u colonoscopy 7/09 showed divertics, and several 4-23mm adenomatous polyps...  ~  11/11:  eval by DrStark for diarrhea & fecal incontinence> he rec refer to [facility], she declined & says she improved w/ METAMUCIL daily.   DEGENERATIVE JOINT DISEASE (ICD-715.90) - followed by DrDean... S/P Left TKR in 12/04 by DrDean & Right TKR 7/02 by DrKrege... known CSpine and L/S spine disease... s/p recent epid steroid shot in lower back w/ improvement... we refilled her MOBIC 7.5mg  for as needed use.  ~  8/11:  she fell & saw DrDean & DrNewton for pain & paresthesias in left arm & improved w/ CSpine injection.  BACK PAIN, LUMBAR (ICD-724.2)  VITAMIN D DEFICIENCY (ICD-268.9) - Vit D level Jul09 = 19 & started on Vit D 50K weekly...  ~  labs 7/10 showed Vit D level = 33... OK to switch to OTC Vit D 1000 u daily, also takes MVI & Calcium.  ~  labs 1/12 showed Vit D level = 26... rec incr OTC supplement to 2000 u daily.  ABNORMALITY OF GAIT (ICD-781.2)  ANXIETY (ICD-300.00) - she is under the care of DrPlovsky for Psychiatry now on EFFEXOR XR 75mg - 2 tabs Qam, & LORAZEPAM 0.5mg  as needed... she was prev on Wellbutrin & Celexa, but DrPlovsky makes fre changes.  ANEMIA (ICD-285.9) - after her diverticular bleed in 2006... on Fe therapy w/ resolution...  ~  labs 8/08 showed Hg= 12.8  ~  labs 7/10 showed Hg= 13.2  ~  labs 1/11 showed Hg= 13.9, Fe= 66 (16%sat)  ~  labs 1/12 showed Hg=  13.5   Preventive Screening-Counseling & Management  Alcohol-Tobacco     Alcohol drinks/day: 0     Smoking Status: never  Allergies: 1)  ! Penicillin 2)  ! Keflex 3)  Amoxicillin (Amoxicillin)  Comments:  Nurse/Medical Assistant: The patient's medications and allergies were reviewed with the patient and were updated in the Medication and Allergy Lists.  Past History:  Past Medical History: GLAUCOMA (ICD-365.9) HEARING LOSS (ICD-389.9) CHRONIC OSTEOMYELITIS OTHER SPECIFIED SITES (ICD-730.18) OBSTRUCTIVE SLEEP APNEA (ICD-327.23) BRONCHITIS, RECURRENT (ICD-491.9) HYPERTENSION (ICD-401.9) CAD (ICD-414.00) PALPITATIONS (ICD-785.1) HYPERCHOLESTEROLEMIA (ICD-272.0) Hx of THYROID NODULE (ICD-241.0) HIATAL HERNIA (ICD-553.3) GERD (ICD-530.81) DIVERTICULOSIS OF COLON (ICD-562.10) DIVERTICULAR BLEEDING, HX OF (ICD-V12.79) COLONIC POLYPS, ADENOMATOUS, HX OF (  ICD-V12.72) DEGENERATIVE JOINT DISEASE (ICD-715.90) BACK PAIN, LUMBAR (ICD-724.2) VITAMIN D DEFICIENCY (ICD-268.9) ABNORMALITY OF GAIT (ICD-781.2) ANXIETY (ICD-300.00) ANEMIA (ICD-285.9)  Past Surgical History: Appendectomy Cholecystectomy Hysterectomy TOTAL KNEE REPLACEMENT, LEFT, HX OF (ICD-V43.65) TOTAL KNEE REPLACEMENT, RIGHT, HX OF (ICD-V43.65) CATARACT EXTRACTION, HX OF (ICD-V45.61)  Family History: Reviewed history from 07/02/2010 and no changes required. mother deceased age 52 from accident father unknown no siblings No FH of Colon Cancer:  Social History: Reviewed history from 03/10/2008 and no changes required. retired used to work as Barista alone.  no tob etoh widowed Daily Caffeine Use  Review of Systems      See HPI       The patient complains of dyspnea on exertion and difficulty walking.  The patient denies anorexia, fever, weight loss, weight gain, vision loss, decreased hearing, hoarseness, chest pain, syncope, peripheral edema, prolonged cough, headaches, hemoptysis, abdominal  pain, melena, hematochezia, severe indigestion/heartburn, hematuria, incontinence, muscle weakness, suspicious skin lesions, transient blindness, depression, unusual weight change, abnormal bleeding, enlarged lymph nodes, and angioedema.    Vital Signs:  Patient profile:   77 year old female Menstrual status:  postmenopausal Height:      65 inches Weight:      188.50 pounds BMI:     31.48 O2 Sat:      99 % on Room air Temp:     97.2 degrees F oral Pulse rate:   71 / minute BP sitting:   140 / 80  (left arm) Cuff size:   regular  Vitals Entered By: Randell Loop CMA (August 16, 2010 9:00 AM)  O2 Sat at Rest %:  99 O2 Flow:  Room air CC: 6 month ROV & review of mult medical problems... Is Patient Diabetic? No Pain Assessment Patient in pain? yes      Onset of pain  at times with arthritis pain Comments no changes in meds today   Physical Exam  Additional Exam:  WD, Overweight, 77 y/o BF in NAD... wt= 190#  GENERAL:  Alert, pleasant & cooperative... I did not do formal MMSE... HEENT:  /AT, EOM-wnl, PERRLA, EACs-clear, TMs-wnl, NOSE-clear, THROAT-clear & wnl, JAW- no pain or tenderness. NECK:  Supple w/ fairROM; no JVD; normal carotid impulses w/o bruits; no thyromegaly or nodules palpated (cannot feel the multinod goiter), no lymphadenopathy. CHEST:  Clear to P & A; without wheezes/ rales/ or rhonchi heard... HEART:  Regular Rhythm; without murmurs/ rubs/ or gallops detected... ABDOMEN:  Soft & nontender; normal bowel sounds; no organomegaly or masses palpated... EXT:  s/p bilat TKRs, mod arthritic changes; no varicose veins/ +venous insuffic/ tr edema. NEURO:  CN's intact; no focal neuro deficits... DERM:  No lesions noted; no rash etc...    MISC. Report  Procedure date:  08/16/2010  Findings:      BMP (METABOL)   Sodium                    142 mEq/L                   135-145   Potassium                 3.7 mEq/L                   3.5-5.1   Chloride                   103 mEq/L  96-112   Carbon Dioxide       [H]  34 mEq/L                    19-32   Glucose                   94 mg/dL                    16-10   BUN                       14 mg/dL                    9-60   Creatinine                0.8 mg/dL                   4.5-4.0   Calcium                   10.1 mg/dL                  9.8-11.9   GFR                       88.58 mL/min                >60.00  Hepatic/Liver Function Panel (HEPATIC)   Total Bilirubin           1.2 mg/dL                   1.4-7.8   Direct Bilirubin          0.2 mg/dL                   2.9-5.6   Alkaline Phosphatase      60 U/L                      39-117   AST                       29 U/L                      0-37   ALT                       16 U/L                      0-35   Total Protein             7.1 g/dL                    2.1-3.0   Albumin                   3.8 g/dL                    8.6-5.7  CBC Platelet w/Diff (CBCD)   White Cell Count          6.4 K/uL                    4.5-10.5   Red Cell Count            4.49 Mil/uL  3.87-5.11   Hemoglobin                13.5 g/dL                   16.1-09.6   Hematocrit                41.4 %                      36.0-46.0   MCV                       92.3 fl                     78.0-100.0   Platelet Count            231.0 K/uL                  150.0-400.0   Neutrophil %              60.8 %                      43.0-77.0   Lymphocyte %              28.1 %                      12.0-46.0   Monocyte %                9.7 %                       3.0-12.0   Eosinophils%              1.0 %                       0.0-5.0   Basophils %               0.4 %                       0.0-3.0  Comments:      Lipid Panel (LIPID)   Cholesterol               158 mg/dL                   0-454   Triglycerides             91.0 mg/dL                  0.9-811.9   HDL                       14.78 mg/dL                 >29.56   LDL Cholesterol           86 mg/dL                     2-13  TSH (TSH)   FastTSH                   1.53 uIU/mL                 0.35-5.50  Vitamin D (25-Hydroxy) (08657)  Vitamin D (25-Hydroxy)                        [  L]  26 ng/mL                    30-89   Impression & Recommendations:  Problem # 1:  OBSTRUCTIVE SLEEP APNEA (ICD-327.23) Stable on CPAP 10...  Problem # 2:  HYPERTENSION (ICD-401.9) Controlled on meds>  continue same... Her updated medication list for this problem includes:    Toprol Xl 50 Mg Tb24 (Metoprolol succinate) .[name]... Take 1 tablet by mouth once a day    Felodipine 5 Mg Tb24 (Felodipine) .[name]... 1 tab by mouth daily...    Hyzaar 100-25 Mg Tabs (Losartan potassium-hctz) .[name]... Take 1 tablet by mouth once a day  Orders: TLB-BMP (Basic Metabolic Panel-BMET) (80048-METABOL) TLB-Hepatic/Liver Function Pnl (80076-HEPATIC) TLB-CBC Platelet - w/Differential (85025-CBCD) TLB-Lipid Panel (80061-LIPID) TLB-TSH (Thyroid Stimulating Hormone) (84443-TSH) T-Vitamin D (25-Hydroxy) (04540-98119)  Problem # 3:  CAD (ICD-414.00) Stable>  no angina, palpit, SOB... continue same meds. Her updated medication list for this problem includes:    Toprol Xl 50 Mg Tb24 (Metoprolol succinate) .[name]... Take 1 tablet by mouth once a day    Felodipine 5 Mg Tb24 (Felodipine) .[name]... 1 tab by mouth daily...    Hyzaar 100-25 Mg Tabs (Losartan potassium-hctz) .[name]... Take 1 tablet by mouth once a day  Problem # 4:  HYPERCHOLESTEROLEMIA (ICD-272.0) Stable on Lip10... continue same. Her updated medication list for this problem includes:    Lipitor 10 Mg Tabs (Atorvastatin calcium) .[name]... Take 1 tablet by mouth once a day  Problem # 5:  Hx of THYROID NODULE (ICD-241.0) Gland & TSH are OK... no meds required...  Problem # 6:  DIVERTICULOSIS OF COLON (ICD-562.10) GI reviewed>  divertics, hx bleed, more recent diarrhea, fecal incontinence> all improved w/ Metamucil she says.  Problem # 7:  VITAMIN D DEFICIENCY (ICD-268.9) Her Vit D level  is low > rec incr supplement to 2000 u daily...  Problem # 8:  ANXIETY (ICD-300.00) Continue meds from DrPlovsky... Her updated medication list for this problem includes:    Effexor Xr 75 Mg Xr24h-cap (Venlafaxine hcl) .[name]... Take 2 tablet by mouth every morning    Ativan 0.5 Mg Tabs (Lorazepam) .[name]... 1/2 to 1 tab by mouth three times a day as needed for nerves...  Complete Medication List: 1)  Dorzolamide-timolol 2-0.5 % Soln (Dorzolamide-timolol) .[name].. 1 drop in each eye three times a day 2)  Toprol Xl 50 Mg Tb24 (Metoprolol succinate) .... Take 1 tablet by mouth once a day 3)  Felodipine 5 Mg Tb24 (Felodipine) .[name].. 1 tab by mouth daily.Marland KitchenMarland Kitchen 4)  Hyzaar 100-25 Mg Tabs (Losartan potassium-hctz) .... Take 1 tablet by mouth once a day 5)  Potassium Chloride Crys Cr 20 Meq Cr-tabs (Potassium chloride crys cr) .... Take 1 tab by mouth two times a day... 6)  Lipitor 10 Mg Tabs (Atorvastatin calcium) .... Take 1 tablet by mouth once a day 7)  Nexium 40 Mg Cpdr (Esomeprazole magnesium) .[name].. 1 tab by mouth two times a day 8)  Mobic 7.5 Mg Tabs (Meloxicam) .... Take 1 tab by mouth once daily as needed for arthritis pain.Marland KitchenMarland Kitchen 9)  Oscal 500/200 D-3 500-200 Mg-unit Tabs (Calcium-vitamin d) .... Take one tablet by mouth two times a day 10)  Womens Multivitamin Plus Tabs (Multiple vitamins-minerals) .... Take 1 tab by mouth once daily... 11)  Vitamin D 1000 Unit Tabs (Cholecalciferol) .... Take 1 tablet by mouth once a day 12)  Effexor Xr 75 Mg Xr24h-cap (Venlafaxine hcl) .... Take 2 tablet by mouth every morning 13)  Ativan 0.5 Mg Tabs (Lorazepam) .... 1/2 to 1 tab by mouth three times a day as needed for nerves...  Patient Instructions: 1)  Today we updated your med list- see below.... 2)  Continue your current meds the same... 3)  Today we did your follow up FASTING blood work... please call the "phone tree" in a few days for your lab results.Marland KitchenMarland Kitchen  4)  Stay as active as poss (walking "Levora Angel" is a great way  to get some exercise) 5)  Call for any problems.Marland KitchenMarland Kitchen 6)  Please schedule a follow-up appointment in 6 months, sooner as needed.   Immunization History:  Influenza Immunization History:    Influenza:  historical (04/16/2010)

## 2010-09-11 ENCOUNTER — Encounter: Payer: Self-pay | Admitting: Cardiovascular Disease

## 2010-09-11 ENCOUNTER — Ambulatory Visit (INDEPENDENT_AMBULATORY_CARE_PROVIDER_SITE_OTHER): Payer: Medicare Other | Admitting: Cardiovascular Disease

## 2010-09-11 DIAGNOSIS — R002 Palpitations: Secondary | ICD-10-CM

## 2010-09-11 DIAGNOSIS — E785 Hyperlipidemia, unspecified: Secondary | ICD-10-CM

## 2010-09-11 DIAGNOSIS — I10 Essential (primary) hypertension: Secondary | ICD-10-CM

## 2010-09-20 NOTE — Assessment & Plan Note (Signed)
Summary: V4Q- GD   Primary Provider:  Alroy Dust MD  CC:  pt complians of BP issues.  History of Present Illness: [name] is seen today for F/U of HTN, elevated lipids and palpitations.  Event monitor reviewed and showed only benign PAC's and PVC's.  There was no afib.  She had a normal cath in 2006 and non-ischemic myhovue in 2007.  She is tolerating lipitor with no myalgias.  She's had some arthritic hip and neck pain that she just saw Tammy Parret for.  She is not having any SSCP, dyspnea, she ambulates well with a cane and she is having less palpitations on Toprol  Has been stressed lately as she had her identity stolen.  Working with [employer] to help  Bad checks written in her name at [employer]  Current Problems (verified): 1)  Full Incontinence of Feces  (ICD-787.60) 2)  Total Knee Replacement, Left, Hx of  (ICD-V43.65) 3)  Total Knee Replacement, Right, Hx of  (ICD-V43.65) 4)  Cataract Extraction, Hx of  (ICD-V45.61) 5)  Glaucoma  (ICD-365.9) 6)  Hearing Loss  (ICD-389.9) 7)  Chronic Osteomyelitis Other Specified Sites  (ICD-730.18) 8)  Obstructive Sleep Apnea  (ICD-327.23) 9)  Bronchitis, Recurrent  (ICD-491.9) 10)  Hypertension  (ICD-401.9) 11)  Cad  (ICD-414.00) 12)  Palpitations  (ICD-785.1) 13)  Hypercholesterolemia  (ICD-272.0) 14)  Hx of Thyroid Nodule  (ICD-241.0) 15)  Hiatal Hernia  (ICD-553.3) 16)  Gerd  (ICD-530.81) 17)  Diverticulosis of Colon  (ICD-562.10) 18)  Diverticular Bleeding, Hx of  (ICD-V12.79) 19)  Colonic Polyps, Adenomatous, Hx of  (ICD-V12.72) 20)  Degenerative Joint Disease  (ICD-715.90) 21)  Back Pain, Lumbar  (ICD-724.2) 22)  Vitamin D Deficiency  (ICD-268.9) 23)  Abnormality of Gait  (ICD-781.2) 24)  Anxiety  (ICD-300.00) 25)  Anemia  (ICD-285.9)  Current Medications (verified): 1)  Dorzolamide-Timolol 2-0.5 % Soln (Dorzolamide-Timolol) .[name].. 1 Drop in Each Eye Three Times A Day 2)  Toprol Xl 50 Mg  Tb24 (Metoprolol Succinate) .... Take 1  Tablet By Mouth Once A Day 3)  Felodipine 5 Mg  Tb24 (Felodipine) .[name].. 1 Tab By Mouth Daily.Marland KitchenMarland Kitchen 4)  Hyzaar 100-25 Mg Tabs (Losartan Potassium-Hctz) .... Take 1 Tablet By Mouth Once A Day 5)  Potassium Chloride Crys Cr 20 Meq Cr-Tabs (Potassium Chloride Crys Cr) .... Take 1 Tab By Mouth Two Times A Day... 6)  Lipitor 10 Mg Tabs (Atorvastatin Calcium) .... Take 1 Tablet By Mouth Once A Day 7)  Nexium 40 Mg  Cpdr (Esomeprazole Magnesium) .[name].. 1 Tab By Mouth Two Times A Day 8)  Mobic 7.5 Mg  Tabs (Meloxicam) .... Take 1 Tab By Mouth Once Daily As Needed For Arthritis Pain... 9)  Oscal 500/200 D-3 500-200 Mg-Unit Tabs (Calcium-Vitamin D) .... Take One Tablet By Mouth Two Times A Day 10)  Womens Multivitamin Plus  Tabs (Multiple Vitamins-Minerals) .... Take 1 Tab By Mouth Once Daily... 11)  Vitamin D 1000 Unit Tabs (Cholecalciferol) .... Take 1 Tablet By Mouth Once A Day 12)  Effexor Xr 75 Mg Xr24h-Cap (Venlafaxine Hcl) .... Take 2 Tablet By Mouth Every Morning 13)  Ativan 0.5 Mg  Tabs (Lorazepam) .... 1/2 To 1 Tab By Mouth Three Times A Day As Needed For Nerves...  Allergies (verified): 1)  ! Penicillin 2)  ! Keflex 3)  Amoxicillin (Amoxicillin)  Past History:  Past Medical History: Last updated: 08/16/2010 GLAUCOMA (ICD-365.9) HEARING LOSS (ICD-389.9) CHRONIC OSTEOMYELITIS OTHER SPECIFIED SITES (ICD-730.18) OBSTRUCTIVE SLEEP APNEA (ICD-327.23) BRONCHITIS, RECURRENT (ICD-491.9) HYPERTENSION (  ICD-401.9) CAD (ICD-414.00) PALPITATIONS (ICD-785.1) HYPERCHOLESTEROLEMIA (ICD-272.0) Hx of THYROID NODULE (ICD-241.0) HIATAL HERNIA (ICD-553.3) GERD (ICD-530.81) DIVERTICULOSIS OF COLON (ICD-562.10) DIVERTICULAR BLEEDING, HX OF (ICD-V12.79) COLONIC POLYPS, ADENOMATOUS, HX OF (ICD-V12.72) DEGENERATIVE JOINT DISEASE (ICD-715.90) BACK PAIN, LUMBAR (ICD-724.2) VITAMIN D DEFICIENCY (ICD-268.9) ABNORMALITY OF GAIT (ICD-781.2) ANXIETY (ICD-300.00) ANEMIA (ICD-285.9)  Past Surgical History: Last  updated: 08/16/2010 Appendectomy Cholecystectomy Hysterectomy TOTAL KNEE REPLACEMENT, LEFT, HX OF (ICD-V43.65) TOTAL KNEE REPLACEMENT, RIGHT, HX OF (ICD-V43.65) CATARACT EXTRACTION, HX OF (ICD-V45.61)  Family History: Last updated: 2010/07/16 mother deceased age 50 from accident father unknown no siblings No FH of Colon Cancer:  Social History: Last updated: 03/10/2008 retired used to work as Diplomatic Services operational officer  Lives alone.  no tob etoh widowed Daily Caffeine Use  Review of Systems       Denies fever, malais, weight loss, blurry vision, decreased visual acuity, cough, sputum, SOB, hemoptysis, pleuritic pain, palpitaitons, heartburn, abdominal pain, melena, lower extremity edema, claudication, or rash.   Vital Signs:  Patient profile:   77 year old female Menstrual status:  postmenopausal Height:      65 inches Weight:      184 pounds BMI:     30.73 Pulse rate:   80 / minute Pulse rhythm:   irregular Resp:     12 per minute BP sitting:   130 / 68  (left arm)  Vitals Entered By: Kem Parkinson (September 11, 2010 10:46 AM)  Physical Exam  General:  Affect appropriate Healthy:  appears stated age HEENT: normal Neck supple with no adenopathy JVP normal no bruits no thyromegaly Lungs clear with no wheezing and good diaphragmatic motion Heart:  S1/S2 no murmur,rub, gallop or click PMI normal Abdomen: benighn, BS positve, no tenderness, no AAA no bruit.  No HSM or HJR Distal pulses intact with no bruits No edema Neuro non-focal Skin warm and dry    Impression & Recommendations:  Problem # 1:  HYPERTENSION (ICD-401.9) Well controlled Her updated medication list for this problem includes:    Toprol Xl 50 Mg Tb24 (Metoprolol succinate) .[name]... Take 1 tablet by mouth once a day    Felodipine 5 Mg Tb24 (Felodipine) .[name]... 1 tab by mouth daily...    Hyzaar 100-25 Mg Tabs (Losartan potassium-hctz) .[name]... Take 1 tablet by mouth once a day  Problem # 2:  PALPITATIONS  (ICD-785.1) Bengin continue BB Her updated medication list for this problem includes:    Toprol Xl 50 Mg Tb24 (Metoprolol succinate) .[name]... Take 1 tablet by mouth once a day    Felodipine 5 Mg Tb24 (Felodipine) .[name]... 1 tab by mouth daily...  Problem # 3:  HYPERCHOLESTEROLEMIA (ICD-272.0) Assessment: Unchanged At goal with no vascular disease Her updated medication list for this problem includes:    Lipitor 10 Mg Tabs (Atorvastatin calcium) .[name]... Take 1 tablet by mouth once a day  CHOL: 158 (08/16/2010)   LDL: 86 (08/16/2010)   HDL: 53.40 (08/16/2010)   TG: 91.0 (08/16/2010)

## 2010-10-24 ENCOUNTER — Encounter: Payer: Self-pay | Admitting: Gastroenterology

## 2010-10-24 NOTE — Telephone Encounter (Signed)
error 

## 2010-10-24 NOTE — Telephone Encounter (Deleted)
error 

## 2010-11-10 ENCOUNTER — Other Ambulatory Visit: Payer: Self-pay | Admitting: Pulmonary Disease

## 2010-11-10 ENCOUNTER — Other Ambulatory Visit: Payer: Self-pay | Admitting: Cardiovascular Disease

## 2010-12-11 ENCOUNTER — Other Ambulatory Visit: Payer: Self-pay | Admitting: Gastroenterology

## 2010-12-11 LAB — HM COLONOSCOPY

## 2010-12-18 NOTE — Assessment & Plan Note (Signed)
[facility]                            CARDIOLOGY OFFICE NOTE   Analiza, Cowger LAURAJEAN HOSEK                      MRN:          [id number]  DATE:04/20/2008                            DOB:          05/31/1928    HISTORY OF PRESENT ILLNESS:  [name] returns today for followup.  She has  had atypical chest pain in the past with a normal Myoview in April 21, 2006, coronary artery resection with hypertension and  hypercholesterolemia.  They have been fairly well controlled.  She is  trying to watch salt in her diet.  Her weight is down little bit.  She  is not having recurrent chest pain.   REVIEW OF SYSTEMS:  Remarkable for recent hospitalization for  diverticular bleed.   Review her labs showed that her last LDL cholesterol was in the 90s, on  low-dose Lipitor.   ALLERGIES:  She is allergic to PENICILLIN and KEFLEX.   MEDICATIONS:  1. Ativan b.i.d.  2. Hyzaar 100/25.  3. Plendil 5.  4. Nexium 40.  5. Celexa 40.  6. Lipitor 10.  7. Toprol 50.   PHYSICAL EXAMINATION:  GENERAL:  Her exam remarkable for a pleasant  black female in no distress.  VITAL SIGNS:  Her weight is 194, blood pressure 150/80, pulse 64 and  regular, respiratory rate 14, afebrile.  HEENT:  Unremarkable.  Carotids are normal without bruit, no  lymphadenopathy, no thyromegaly, no JVP elevation.  LUNGS:  Clear.  Good diaphragmatic motion.  No wheezing.  S1 and S2 with  a systolic ejection murmur.  PMI normal.  ABDOMEN:  Benign.  Bowel sounds positive.  No AAA, no tenderness, no  bruit, no hepatosplenomegaly, no hepatojugular reflux, no tenderness.  EXTREMITIES:  Distal pulses are intact.  No edema.  NEURO:  Nonfocal.  SKIN:  Warm and dry.  MUSCULOSKELETAL:  No muscular weakness.   EKG shows sinus rhythm with LVH, nonspecific ST-T wave changes and left  atrial enlargement.   IMPRESSION:  1. Hypertension, currently fairly well controlled.  Continue current      dose of  Plendil, Hyzaar, Toprol, low-salt diet.  2. Hypercholesterolemia.  Continue Lipitor 10 mg a day.  Lipid and      liver profile in 6 months per Dr. Kriste Basque.  3. Systolic ejection murmur sounds benign.  She has not had an echo in      a while.  Back in 2002,  she had mild mitral regurgitation.  I      think for the time being, this can be followed.  I suspect it      represents aortic sclerosis.  4. Recent diverticular bleed.  Follow up with GI.  Check hemoglobin      and hematocrit.  Continue iron replacement.  I will see her back in      a year.     [name]. [name], MD, [facility]  Electronically Signed    PCN/MedQ  DD: 04/20/2008  DT: 04/20/2008  Job #: [id number]

## 2010-12-18 NOTE — Discharge Summary (Signed)
NAME:  [name], [name] NO.:  [id number]   MEDICAL RECORD NO.:  [id number]          PATIENT TYPE:  INP   LOCATION:  1431                         [facility]ACILITY[facility]  [facility]   PHYSICIAN:  Malcolm T. [name], MD, FACGDATE OF BIRTH:  09-Dec-1927   DATE OF ADMISSION:  02/09/2008  DATE OF DISCHARGE:  02/11/2008                               DISCHARGE SUMMARY   ADMITTING DIAGNOSES:  6. A 77 year old female with acute lower gastrointestinal bleeding,      suspect diverticular, with previous history of diverticular bleed.  2. Gastroesophageal reflux disease.  3. Hypertension.  4. Glaucoma.  5. History of colon polyps.  6. Nonobstructive coronary artery disease.  7. Mild hypokalemia.  8. Recent mid abdominal discomfort of unclear etiology.   DISCHARGE DIAGNOSES:  1. Resolved mild self-limited diverticular bleed.  2. Mild normocytic anemia secondary to above.  3. Mid abdominal discomfort with negative workup this admission,      suspect functional.  4. Gastroesophageal reflux disease.  5. Hypertension.  6. Glaucoma.  7. History of colon polyps.  8. Nonobstructive coronary artery disease.  9. Mild hypokalemia.  10.Recent mid abdominal discomfort of unclear etiology.   CONSULTATIONS:  None.   PROCEDURES:  Colonoscopy and upper endoscopy with Dr. [name].   BRIEF HISTORY:  This is a pleasant 77 year old African American female  known to Dr. [name] with history of adenomatous colon polyps, universal  diverticulosis and a history of a diverticular bleed in 2004.  At this  time she had onset on the morning of admission with one large maroon  bowel movement about 9 a.m.  This was not associated with any abdominal  pain, nausea or vomiting.  She did note some low back discomfort and had  apparently had a sweat during the night.  She has not had any further  bowel movements since that one episode in the morning but feels urgency.  She has not had any syncope or presyncope, has felt  lightheaded.  She  has also noted some recent epigastric growling type discomfort.  She  says her appetite has been fine.  Her weight has been stable.  She is  seen in the emergency room and found to be hemodynamically stable, did  have maroon stool on rectal exam.  Hemoglobin was 12.4.  She was  admitted for supportive management of a probable recurrent diverticular  bleed.   HOSPITAL COURSE:  The patient was admitted to the service of Dr. Claudette Head who was covering the hospital.  She was initially placed on a  clear liquid diet, IV fluids and serial H and H's.  She had a very  stable hospital course, did not have any further evidence of active  bleeding, did not require transfusion.  The following morning her  hemoglobin had dropped mildly to 11.2, hematocrit of 34.3.  Again,  serial values were obtained.  By July 9 hemoglobin was back up to 12.4,  hematocrit of 37.9 now.  As the patient was due for colonoscopy and was  very concerned about the etiology of her recent mid abdominal discomfort  decision was made  to proceed with colonoscopy during this admission.  She tolerated a prep without difficulty and underwent colonoscopy with  Dr. [name] on the morning of July 9.  This did show universal  diverticulosis and multiple small colon polyps from the transverse colon  to the ascending colon.  There were four polyps that were sent to  pathology.  There was no blood in the colon at the time of the exam.  She also underwent upper endoscopy to further evaluate her abdominal  pain and this showed an unspecified gastritis and multiple gastric  polyps, the largest was removed.  These were felt most consistent with  fundic gland polyps.  The patient also underwent upper abdominal  ultrasound this admission which was unremarkable.  As she was stable she  was allowed discharge to home.   As the patient had been stable she was allowed discharge to home on the  afternoon of her procedures in a  stable and improved condition with  instructions to follow up with Dr. [name] on August 6 and to call for any  problems in the interim.  She was to resume a regular diet, to avoid  aspirin and NSAIDs over the next 2 weeks.  She was to continue her usual  home meds which included.   MEDICATIONS:  1. Clindamycin 300 mg four times daily which she is finishing for a      tooth abscess.  2. Toprol XL 50 daily.  3. Hyzaar 100/25 daily.  4. Plendil 5 mg daily.  5. Nexium 40 b.i.d.  6. Celexa 40 mg daily.  7. Lipitor 10 p.o. daily.  8. Restoril at bedtime.  9. Robinul Forte 2 mg b.i.d. p.r.n. for abdominal discomfort.      Amy Esterwood, PA-C      Malcolm T. [name], MD, [facility]  Electronically Signed    AE/MEDQ  D:  02/11/2008  T:  02/11/2008  Job:  [id number]

## 2010-12-18 NOTE — Assessment & Plan Note (Signed)
[facility]                            CARDIOLOGY OFFICE NOTE   Jenny, Guzman REATHEL TURI                      MRN:          [id number]  DATE:05/01/2007                            DOB:          1928/03/06    [name] returns today for follow up. She has had atypical chest pain in  the past. She has had a distant history of a cath which did not show  significant disease. Her last Myoview was done in September 2007 and had  apical thinning with no ischemia or infarction. Her EF was 69%. Coronary  risk factors include hypercholesterolemia and high blood pressure. She  is on four blood pressure pills and her blood pressure in general tends  to be well controlled. I do not have recent lipids on her, but they are  followed by Dr. Kriste Basque. She had normal LFTs on 03/10/2007.   In regards to her chest pain, she continues to have it infrequently and  it is not always exertional. It is in the center of her chest and it can  radiate to the left. There is no associated shortness of breath or  diaphoresis. It has not been progressive. There is nothing to do to make  it better or worse. It tends to resolve on its own. She does not take  nitroglycerin for it.   The patient has been compliant with her medications.   Unfortunately, her husband of 57 years passed away in March 02, 2023. She only has  one son who has been in a nursing home with MS, but she says that she  has a lot of friends and seems to have a positive attitude.   REVIEW OF SYSTEMS:  Otherwise negative.   MEDICATIONS:  1. Toprol 50 mg daily.  2. Plendil 5 mg daily.  3. Hyzaar 50 mg daily.  4. Celexa 20 mg daily.  5. Cosopt 1 drop t.i.d. to her eyes.  6. Restoril p.r.n.   PHYSICAL EXAMINATION:  GENERAL:  Elderly black female in no distress.  VITAL SIGNS:  Blood pressure 150/80, pulse 63 and regular, respiratory  rate 14. She is afebrile. There is no weight in the chart.  HEENT:  Normal.  NECK:  There is no  lymphadenopathy, thyromegaly, or JVP elevation.  Supple.  LUNGS:  Clear with good diaphragmatic motion and no wheezing.  CARDIAC:  S1, S2 with normal heart sounds. PMI is normal.  ABDOMEN:  Benign. No tenderness. No hepatosplenomegaly. No hepato  jugular reflux. No AAA. No bruit.  EXTREMITIES:  Femorals are +3 bilaterally. Posterior tibialis pulses are  +3. There is no lower extremity edema.  NEUROLOGIC:  Non-focal. There is no muscular weakness.  SKIN:  Warm and dry.   Her EKG shows sinus rhythm at a rate of 60 with non-specific ST-T wave  changes.   IMPRESSION:  1. Previous heart cath without critical disease, atypical chest pain,      Myoview in the last year, non-ischemic. I do not think that she      needs a follow up test at this point. We will see her back in a  year. She will continue aspirin daily, as well as her beta blocker.  2. Hypertension. Currently well controlled. Continue Toprol, Plendil,      and Hyzaar, and low salt diet. Follow up with Dr. Kriste Basque.  3. Hypercholesterolemia. Looking back through the chart, she did have      an LDL cholesterol that was 88. She will continue to take fiber.      She is not on a statin drug and since there is no critical coronary      artery disease, I think that it is fine.  4. Anxiety/depression. Recent widow in July, p.r.n. Restoril for      sleep. Continue Celexa. Follow up with Dr. Kriste Basque. She actually      seems to be doing fairly well considering that she is alone now and      her son is in a nursing home with chronic MS.   I will see her back in a year.     [name]. [name], MD, [facility]  Electronically Signed    PCN/MedQ  DD: 05/01/2007  DT: 05/01/2007  Job #: [id number]

## 2010-12-18 NOTE — Assessment & Plan Note (Signed)
[facility]                         GASTROENTEROLOGY OFFICE NOTE   [name], [name]                      MRN:          [id number]  DATE:10/21/2007                            DOB:          1928/04/13    [name] notes intermittent episodes of mild epigastric pain,  associated with stomach growling.  She notes no increased gas,  belching or flatus today but she had complained of intestinal gas and  bloating during a recent visit to Dr. Kriste Basque.  Her bowel habits are  regular, without melena or change in stool caliber.  Her reflux symptoms  are well-controlled on Nexium, without dysphagia or odynophagia.  Her  weight is stable and her appetite is good.  She has not had any recent  medication changes, except for Mylicon and Phazyme which has apparently  improved her gams and bloating.   MEDICATION ALLERGIES:  PENICILLIN and KEFLEX.   EXAM:  No acute distress.  Weight 192 pounds.  Blood pressure is 124/72, pulse 66 and regular.  CHEST:  Clear to auscultation bilaterally.  CARDIAC:  Regular rate and rhythm without murmurs appreciated.  ABDOMEN:  Soft with minimal epigastric tenderness to deep palpation, no  rebound or guarding, no palpable organomegaly, masses or hernias.  Normoactive bowel sounds.   ASSESSMENT AND PLAN:  1. Minimal epigastric pain.  Given the history of gas, bloating and      stomach growling I suspect this is a functional complaint.  Trial      of Levsin two sublingually q.i.d. p.r.n.  Return office visit in      four to six weeks, if her symptoms have not resolved.  2. GERD.  Renewed Nexium 40 mg p.o. b.i.d.  Continue all standard      antireflux measures.  3. Personal history of adenomatous colon polyps.  Surveillance      colonoscopy recommended for May 2010.     Venita Lick. [name], MD, [facility]  Electronically Signed    MTS/MedQ  DD: 10/21/2007  DT: 10/21/2007  Job #: [id number]   cc:   Lonzo Cloud. Kriste Basque, MD

## 2010-12-21 NOTE — Discharge Summary (Signed)
NAME:  [name], [name]                         ACCOUNT NO.:  [id number]   MEDICAL RECORD NO.:  [id number]                   PATIENT TYPE:  INP   LOCATION:  5038                                 FACILITY:  [facility]   PHYSICIAN:  Burnard Bunting, M.D.                 DATE OF BIRTH:  08-22-1927   DATE OF ADMISSION:  07/19/2003  DATE OF DISCHARGE:  07/22/2003                                 DISCHARGE SUMMARY   DISCHARGE DIAGNOSIS:  Left knee arthritis.   SECONDARY DIAGNOSES:  1. Hypertension.  2. Asthma.  3. Mitral valve prolapse.  4. Coronary artery disease.  5. Chronic obstructive pulmonary disease.  6. Gastroesophageal reflux disease.   OPERATION AND SKILLED PROCEDURES:  Left total knee replacement performed  July 19, 2003.   HOSPITAL COURSE:  [name] is a 77 year old patient with end-stage left  knee arthritis.  She underwent left total knee replacement July 19, 2003.  She tolerated the procedure well without immediate postop  complications.  Postop neurovascular status of left foot was intact.  Postop  hematocrit on day #1 was 30.  CPM and physical therapy were initiated.  Coumadin was started for DVT prophylaxis.  Hemoglobin 9.6 on postop day #2.  Incision was intact at time of transfer.  The patient was transferred to the  rehab unit on July 22, 2003.  She transferred in good condition.   DISCHARGE MEDICATIONS:  1. Admission medications.  2. Coumadin.  3. Percocet.   FOLLOW UP:  She will follow up in one and one-half weeks for suture removal.                                                Burnard Bunting, M.D.    GSD/MEDQ  D:  09/13/2003  T:  09/13/2003  Job:  [id number]

## 2010-12-21 NOTE — H&P (Signed)
[place]  Patient:    [name], VACA                        MRN: [id number] Adm. Date:  02/23/01 Attending:  Reynolds Bowl, M.D.                         History and Physical  INTRODUCTION: Ms. Balla is a 77 year old right-handed lady with a complaint of constant pain in the right knee, interfering with all activities, including sleep.  HISTORY OF PRESENT ILLNESS: She has been evaluated as an outpatient and found to have tricompartment osteoarthritis.  We have discussed surgery, possible complications and what we do to try to prevent it, the fact that there are no guarantees, and she would like to proceed on with this surgery.  As part of her preoperative evaluation she has been evaluated by Dr. Alroy Dust, who feels she is clear for surgery providing that she has an EKG and chest x-ray.  ALLERGIES:  1. PENICILLIN.  2. KEFLEX.  PAST SURGICAL HISTORY:  1. Hysterectomy.  2. Knee surgery.  4. Foot surgery.  FAMILY HISTORY: Positive for hypertension.  SOCIAL HISTORY: The patient does not smoke cigarettes or drink ethanol.  REVIEW OF SYSTEMS: She has cataracts, asthmatic bronchitis, glaucoma, high blood pressure, depression.  MEDICATIONS: Per Dr. Alroy Dust:  1. Toprol XL 50 mg q.d.  2. Hyzaar 50/125 mg one q.d.  3. Prevacid 30 mg one b.i.d.  4. Lipitor 10 mg q.d.  5. Baby aspirin one q.d.  6. Trusopt one drop t.i.d.  7. Plendil 5 mg q.d.  8. Albuterol inhaler two puffs q.d.  9. Celexa 20 mg q.d. 10. Ambien 10 mg 1/2 at h.s. 11. Allegra 60 mg b.i.d. 12. Flovent inhaler two puffs p.r.n. 13. Clonazepam 0.5 mg two p.r.n.  PHYSICAL EXAMINATION:  VITAL SIGNS: Height 5 feet 5-1/2 inches.  Weight 188 pounds.  Temperature 96.8 degrees, blood pressure 136/82, pulse 72, respirations 16.  GENERAL: She overall appears to be in fairly good health.  HEENT: She is wearing reading glasses.  Extraocular movements full.   Tympanic membranes normal.  Pharynx clear.  She has a partial lower denture.  NECK: There is no palpable cervical mass and there are no carotid bruits.  CHEST: Volume is good.  Breath sounds are clear.  HEART: Regular rhythm.  No murmurs heard.  ABDOMEN: Round, soft, nontender.  No palpable masses.  Bowel sounds present.  ORTHOPEDIC EVALUATION: She has good femoral pulses without bruits.  Dorsalis pedis pulses are good.  There is bilaterally 1+ pretibial edema.  Hips rotate well and comfortably.  The right knee motion is from 5-125 degree, at 105 degrees quite painful.  There is slight varus valgus rocking.  There is mild crepitation.  There is tenderness to palpation about and there seems to be some thickening of her synovium.  LABORATORY DATA: X-rays show tricompartment osteoarthritis.  ADMISSION DIAGNOSES:  1. Tricompartment osteoarthritis, right knee.  2. Hypertension.  3. Asthmatic bronchitis.  4. Hypercholesterolemia.  PLAN: Right total knee arthroplasty.  As full discussed, no guarantee is offered. DD:  02/23/01 TD:  02/23/01 Job: [id number]

## 2010-12-21 NOTE — Op Note (Signed)
NAME:  [name], [name]                         ACCOUNT NO.:  [id number]   MEDICAL RECORD NO.:  [id number]                   PATIENT TYPE:  INP   LOCATION:  5038                                 FACILITY:  [facility]   PHYSICIAN:  Burnard Bunting, M.D.                 DATE OF BIRTH:  1927-10-21   DATE OF PROCEDURE:  07/20/2003  DATE OF DISCHARGE:  07/22/2003                                 OPERATIVE REPORT   PREOPERATIVE DIAGNOSIS:  Left knee __________.   POSTOPERATIVE DIAGNOSIS:  Left knee __________.   PROCEDURE:  Left total knee arthroplasty with Osteonix cemented components,  7 femur, 7 tibia, 5 patella, 12 insert.   SURGEON:  Burnard Bunting, M.D.   ASSISTANT:  Jerolyn Shin. Tresa Res, M.D.   ANESTHESIA:  General endotracheal anesthesia.   ESTIMATED BLOOD LOSS:  150 mL.   DRAINS:  None.   TOURNIQUET TIME:  1 hour and 52 minutes at 350 mmHg, 4 minutes at 300 mmHg.   DESCRIPTION OF PROCEDURE:  The patient was brought to the operating room  where general endotracheal anesthesia was induced. Preoperative antibiotics  were administered. The left leg was prepped with DuraPrep solution including  the foot and draped in a sterile manner. Collier Flowers was used to cover the  operative field. The leg was elevated and exsanguinated with the Esmarch  wrap. The tourniquet was inflated.   The skin incision was made on the anterior aspect of the knee. The skin and  subcutaneous tissue were sharply divided. A median peripatellar arthrotomy  was then performed. The sites of the patient's arthrotomy was marked with a  #1 Vicryl suture at the inferomedial aspect of the patella and extensor  mechanism.   The patella was everted. Osteophytes were removed. The lateral  patellofemoral ligament was cut. A sleeve of tissue  was elevated  circumferentially around to the posteromedial and posterolateral aspects of  the tibia for visualization. The fat pad was partially excised. The ACL and  PCL were resected.   A  distal  femoral cut was then performed using intramedullary guidance. A 12-  mm resection was performed. This was made 5 degrees of valgus. Using the  manual cutting jig the anterior cutting jig was then placed parallel to the  transepicondylar axis.  It was noted that a size 7 would fit well on the  patient's anatomy. The size 7 cutting block was then applied in the  appropriate amount of external rotation.   Anterior, posterior and chamfer cuts were then performed. At this time the  tibia was rotated forward with the posterior retractor carefully  placed.  Intramedullary alignment wrap was then placed after centering with a size 7  patella. Osteophytes were removed from the medial and lateral aspect of the  tibial plateau.   Using the intramedullary alignment, an 8 to 10-mm resection was made off the  tibia. A cut was made perpendicular  to the mechanical axis. A box cut was  then made on the femur and trial components were placed. The knee had good  flexion and extension with a 10 and 12-mm insert. The trial ligament  stability was slightly better with a 12-mm insert.  The posterior capsule  was stripped carefully  using the Cobb elevator. Remnant PCL was also  removed.   At this time the patella was cut in freehand fashion. A size 5 stem trial  was placed. The pre and post resection patellar thickness was within 1 to 2  mm. The trial components were then placed. The patella had excellent  tracking. The knee had full range of motion and good collateral ligament  stability with the 12-mm insert.   At this time the trial components were removed. The knee was thoroughly  irrigated. The components were then cemented into position  beginning with  the tibia. Excess cement was removed. Next  was the femur, then the patella.  A 12-mm insert was placed. The tourniquet was released. Bleeding point  encountered were controlled using electrocautery. The patella tracked well  with no-thumbs  technique. Collateral ligament stability was good. The  patient had about 2 degrees of hyperextension.   At this timet the incision was again irrigated. The extensor mechanism was  reapproximated using combination of 0 and #1 Vicryl suture. The skin and  subcutaneous tissue were closed using interrupted inverted 2-0 Vicryl suture  and skin staples. The patient was then placed into a bulky knee dressing  and knee immobilizer.   She tolerated the procedure well. There were no immediate complications.                                               Burnard Bunting, M.D.    GSD/MEDQ  D:  08/21/2003  T:  08/21/2003  Job:  [id number]

## 2010-12-21 NOTE — Assessment & Plan Note (Signed)
[facility]                              CARDIOLOGY OFFICE NOTE   [name], [name]                      MRN:          [id number]  DATE:04/11/2006                            DOB:          September 06, 1927    [name] returns today for followup.  She has been doing fairly well.  She does get intermittent chest pain still.  She has had previous cath  moderate,  but non-obstructive coronary disease. She needs to have a  followup Myoview.  She does have nitroglycerin and says it quickly relieves  the pain.  She has only had to take nitro two to three times a month.   Unfortunately her husband died in 03-01-2023.  She has had two children, one died  quite a long time ago and one is in the nursing home with MS.   She seems to be somewhat lonely and is having a bit of a tough time.  Outside of her occasional chest pain she is doing well.  Her medications are  listed in the chart.   EXAMINATION:  VITAL SIGNS:  Blood pressure 130/70.  Pulse 60 and regular.  NECK:  Carotids are normal.  HEART:  There is normal S1, S2.  Normal heart sounds.  ABDOMEN:  Benign.  EXTREMITIES:  Distal pulses are intact and no edema.   EKG:  Shows non-specific ST-T wave changes with a QT interval of 460.   IMPRESSION:  1. Stable.  Previously moderate but non-obstructive coronary disease by      catheterization, followup Myoview.  Continue current medical therapy.      If she has a change in her anginal pattern she will need a repeat      catheterization, however I think for the time being a followup Myoview      is in order.                               [name]. [name], MD, [facility]    PCN/MedQ  DD:  04/11/2006  DT:  04/11/2006  Job #:  [id number]

## 2010-12-21 NOTE — Discharge Summary (Signed)
NAME:  [name], [name]                         ACCOUNT NO.:  [id number]   MEDICAL RECORD NO.:  [id number]                   PATIENT TYPE:  IPS   LOCATION:  4143                                 FACILITY:  [facility]   PHYSICIAN:  Ellwood Dense, M.D.                DATE OF BIRTH:  Jan 29, 1928   DATE OF ADMISSION:  07/22/2003  DATE OF DISCHARGE:  07/29/2003                                 DISCHARGE SUMMARY   DISCHARGE DIAGNOSES:  1. Left total knee arthroplasty secondary to degenerative joint disease.  2. Pain management.  3. Anemia.  4. Coumadin for deep vein thrombosis prophylaxis.  5. Hypokalemia.  6. Chronic obstructive pulmonary disease.  7. Hypertension.  8. Depression.  9. Hyperlipidemia.  10.      History of a right total knee arthroplasty.   HISTORY OF PRESENT ILLNESS:  A 77 year old black female with history of a  right total knee replacement two and a half years ago admitted December 14  with end-stage degenerative changes of the left knee and no relief with  conservative care. Underwent a left total knee arthroplasty December 14 per  Dr. August Saucer, placed on Coumadin for deep vein thrombosis prophylaxis, weight  bearing as tolerated, postoperative pain management. Anemia 9.6 and  monitored. Total assist for ambulation, lasted INR 1.5. Admitted for  comprehensive rehab program.   PAST MEDICAL HISTORY:  See discharge diagnoses.   PRIMARY CARE PHYSICIAN:  Primary M.D. is Dr. Kriste Basque.   PAST SURGICAL HISTORY:  1. Bilateral cataracts.  2. Cholecystectomy.  3. Hysterectomy.  4. Tonsillectomy.  5. Appendectomy.   ALLERGIES:  PENICILLIN, KEFLEX, CEPHALOSPORIN.   HABITS:  No alcohol or tobacco.   MEDICATIONS PRIOR TO ADMISSION:  1. Toprol.  2. Ecotrin.  3. Allegra.  4. Clonazepam.  5. NitroTab as needed.  6. Albuterol inhaler.  7. Flovent.  8. Lasix.  9. Cozaar.  10.      Eye drops.  11.      Plendil.  12.      Celexa.  13.      Ambien.  14.      Nexium.   SOCIAL  HISTORY:  Lives with husband in [place]. Independent with a cane  prior to admission. Retired. One level home. Three steps to entry. Husband  can assist until mid January before returning to work.   HOSPITAL COURSE:  The patient did well while on rehabilitation services with  therapies initiated on a b.i.d. basis. The following issues were followed  during the patient's rehab course. Pertaining to [name] left total knee  arthroplasty, surgical site healing nicely. No signs of infection. Surgical  site was well approximated. She was ambulating with a walker household  distances, weight bearing as tolerated. The patient would follow with Dr.  August Saucer as advised. Pain management ongoing with the use of oxycodone as needed  with good results. She continued on Coumadin for deep vein thrombosis  prophylaxis with subcutaneous Lovenox until INR of 2.0. Home health nurse  with complete Coumadin protocol followed by [facility]. Postoperative  anemia was stable with latest hemoglobin of 9.0, hematocrit 27.4 on iron  supplement. She had a history of chronic obstructive pulmonary disease which  was without issue during her rehab stay. She was followed by Dr. Kriste Basque as an  outpatient. Blood pressure was controlled with Toprol, Cozaar, Lasix, and  Plendil. There was no orthostatic changes. Her Celexa was ongoing as prior  to hospital admission for history of depression. She had no bowel or bladder  disturbances. Overall, for her functional mobility, she was ambulating  extending household distances with a walker, minimal assist with lower body  dressing. Home health therapies had been arranged. She was discharged to  home.   DISCHARGE MEDICATIONS:  1. Coumadin with latest dose of 4 mg to be completed August 18, 2002.  2. Potassium chloride 20 mEq daily.  3. Celexa 60 mg daily.  4. Protonix 80 mg daily.  5. Plendil 5 mg daily.  6. Trinsicon twice daily.  7. Klonopin 0.5 mg daily.  8.  Lasix 40 mg daily.  9. Allegra daily.  10.      Cozaar 5 mg daily.  11.      Toprol-XL 50 mg daily.  12.      Zocor 20 mg daily.  13.      Ambien 10 mg at bedtime.  14.      Albuterol inhaler two puffs daily.  15.      Oxycodone as needed pain.   ACTIVITY:  As tolerated.   DIET:  Regular.   WOUND CARE:  Cleanse incision daily warm soap and water.   SPECIAL INSTRUCTIONS:  Home  health nurse per [facility] to complete  Coumadin protocol. Home health physical and occupational therapy. The  patient should follow up with Dr. August Saucer as advised.      [name], P.A.                     Ellwood Dense, M.D.    DA/MEDQ  D:  07/28/2003  T:  07/29/2003  Job:  [id number]   cc:   G. Dorene Grebe, M.D.  [address] [place]  [place] [address]  Fax: [phone]   Lonzo Cloud. Kriste Basque, M.D. [facility]

## 2010-12-21 NOTE — H&P (Signed)
NAME:  [name], [name]                         ACCOUNT NO.:  [id number]   MEDICAL RECORD NO.:  [id number]                   PATIENT TYPE:  INP   LOCATION:  0357                                 FACILITY:  [facility]   PHYSICIAN:  Iva Boop, M.D. [facility] [place]           DATE OF BIRTH:  05/21/1928   DATE OF ADMISSION:  09/23/2002  DATE OF DISCHARGE:                                HISTORY & PHYSICAL   CHIEF COMPLAINT:  Rectal bleeding.   HISTORY:  This is a 77 year old African-American woman with known history of  diverticulosis and previous bleeding.  Last colonoscopy December 2002 by  Venita Lick. [name], M.D. [facility] showing diverticulosis in the ascending to  sigmoid colon and two small polyps in the transverse colon.  This morning  she awakened about 7:30 with dark red blood in her stool and she had a  second episode with all blood.  No melena prior to this.  No significant  abdominal pain, cramping, or nausea, vomiting.  She felt dizzy, but not  syncopal.  For the last several hours she has not had a bowel movement,  though she thinks she is about to go again.  In the emergency department her  pulse was 92, blood pressure 153/96.  On arrival her hemoglobin was 11.1,  platelets 171,000, potassium 3.6, glucose 125, normal LFTs, albumin 3.2.  She was in x-ray earlier today to get some films made of her jaw.  Felt  nauseous and vomited but there was no blood.  She had an EGD with a Maloney  dilation in 1998, but none since.   MEDICATIONS:  1. Cozaar 50 mg daily.  2. Lasix 40 mg daily.  3. Clonazepam 0.5 mg b.i.d.  4. Flovent inhaler two sprays b.i.d.  5. Toprol XL 50 mg daily.  6. NitroTab 50/12.5 mg p.r.n.  7. Nexium 40 mg daily.  8. Lipitor 10 mg daily.  9. Baby aspirin daily.  10.      Trusopt eye drops each eye t.i.d.  11.      Study drug Cepttize.  12.      Plendil 5 daily.  13.      Albuterol two puffs daily.  14.      Celexa 20 mg daily.  15.      Ambien 10 mg q.h.s. p.r.n.  16.       Allegra 60 mg two daily as needed.   ALLERGIES:  PENICILLIN and KEFLEX cause itching.   PAST MEDICAL HISTORY:  1. Right total knee replacement July 2002.  2. Status post cholecystectomy June 2000.  3. Chest pain with abnormal Cardiolite, but EF 60-65% and distal disease on     catheterization.  4. Gastroesophageal reflux disease with sliding hiatal hernia.  5. Hypertension.  6. Dyslipidemia.  7. Glaucoma.  8. Status post hysterectomy and appendectomy.   FAMILY HISTORY:  Positive for coronary artery disease.   SOCIAL HISTORY:  Married.  Lives with her husband.  No tobacco.  No alcohol.  She is retired.   REVIEW OF SYSTEMS:  No chest pain or dyspnea.  She is having some pain and  tooth ache in the right lower jaw.  Was to see the dentist today.  No fevers  reported.  All other systems appear negative at this time.   PHYSICAL EXAMINATION:  GENERAL:  Well-developed African-American woman in no  acute distress.  Alert and oriented x3.  VITAL SIGNS:  Temperature 98.9, blood pressure _________/96, pulse 92.  HEENT:  The eyes are anicteric.  Mouth shows edentulous portions of the  right lower jaw.  She is tender on that area externally.  There is no  palpable abscess.  There is no purulent drainage.  There is some edema of  the right lower posterior gum and she has some swelling of the right jaw as  well.  HEART:  S1, S2.  No rubs, murmurs, gallops.  NECK:  Supple without mass, thyromegaly.  LUNGS:  Clear.  ABDOMEN:  Soft, nontender.  Bowel sounds present.  No organomegaly or mass.  RECTAL:  Per the ED M.D. showed maroon stool.  EXTREMITIES:  No clubbing, cyanosis, edema.  NEUROLOGIC:  She is alert and oriented x3, as noted.  Nonfocal.  LYMPH NODES:  No neck or supraclavicular nodes palpated.   LABORATORY DATA:  As mentioned above.   ASSESSMENT:  1. Probable diverticular bleeding.  Her clinical scenario is compatible with     that.  She has had that in the past, remotely, as  well.  2. Jaw pain.  She has been seen by the oral surgeon and she has had x-rays     of this which do not show an abscess.  Thinks there may be gingivitis or     another problem there, but does not think there is significant infection.  3. Hypertension.  4. Dyslipidemia.  5. Glaucoma.  6. Coronary artery disease (minimal).  7. Anemia secondary to number one.   PLAN:  1. Admit.  Hydrate.  Serial hematocrit and transfuse as needed.  Will hold     her aspirin.  2.     Appreciate the oral surgery consult.  3. I think I would withhold endoscopic evaluation at this point depending     upon clinical course.  4. I appreciate the opportunity to care for this patient.                                               Iva Boop, M.D. [facility]    CEG/MEDQ  D:  09/23/2002  T:  09/23/2002  Job:  [id number]   cc:   Venita Lick. [name], M.D. [facility]   Scott R. Egbert Garibaldi, D.D.S.  [address]  [place]  [place] [address]  Fax: [phone]   Charlton Haws, M.D. [facility]   Scott M. Kriste Basque, M.D. [facility]

## 2010-12-21 NOTE — Discharge Summary (Signed)
NAME:  [name], [name]                         ACCOUNT NO.:  [id number]   MEDICAL RECORD NO.:  [id number]                   PATIENT TYPE:  INP   LOCATION:  0352                                 FACILITY:  [facility]   PHYSICIAN:  Lonzo Cloud. Kriste Basque, M.D. [facility]            DATE OF BIRTH:  [birth year]   DATE OF ADMISSION:  12/26/2003  DATE OF DISCHARGE:  12/29/2003                                 DISCHARGE SUMMARY   FINAL DIAGNOSES:  1. Admitted Dec 26, 2003, via the emergency room, with copious lower GI     bleed - believes secondary to diverticulosis.  Bleeding stopped     spontaneously.  GI consultation with Dr. Lina Sar with colonoscopy     revealing extensive left and right colon diverticulosis.  No active     bleeding seen.  Multiple dimunitive polyps (sent for pathology which is     pending).  2. History of diverticulosis and colon polyps, followed by Dr. Claudette Head.  The last colonoscopy in 2002.  History of previous lower     gastrointestinal bleed with hospitalization in March of 2004 by     gastroenterology.  3. History of hiatus hernia and gastroesophageal reflux disease, on Nexium.  4. History of non-obstructive coronary artery disease with cardiac     catheterization in December of 2003 showing a 40% circumflex lesion and     tortuous vessels with EF of 60%.  5. History of hypertension, controlled on medications.  6. History of hyperlipidemia.  The patient is enrolled in a cardiac drug     study at present.  7. History of asthmatic bronchitis and obstructive sleep apnea with previous     evaluation by Dr. Shelle Iron.  The patient uses CPAP at home.  8. History of degenerative arthritis with previous left total knee     replacement in December of 2004 by Dr. Dorene Grebe.  She was on DVT     prophylaxis with Coumadin for about 1 month after that, but this was     discontinued in January of 2005.  She also has a history of a cervical     radiculopathy with neurologic evaluation by  Dr. Kelli Hope.  9. History of thyroid nodule, which was found on a CT chest done to rule out     pulmonary embolism when she presented with chest pain in the past.  This     was biopsied in October of 2004 by Dr. Everardo All, and found to be a     hyperplastic nodule, and is being followed.  10.      History of anemia - hemoglobin down to 10.3 this admission, and     stabilized.  11.      Status post cholecystectomy, hysterectomy, appendectomy,     tonsillectomy, and cataract surgery.  12.      History of glaucoma, on drops.   ASSESSMENT:  1.  PENICILLIN.  2. CEPHALOSPORINS.   BRIEF HISTORY AND PHYSICAL:  The patient is a 77 year old black female known  to me with history of non-obstructive coronary disease, hypertension, and  degenerative arthritis.  She presented to the emergency room on Dec 26, 2003  with copious bright red blood per rectum, starting on the morning of  admission.  She had several episodes at home, and it was associated with  some mild crampy lower abdominal pain, but no fever, chills, or sweats.  She  denied melena, nausea, vomiting, etc.  She has a history of previous lower  GI bleed in March of 2004, and was hospitalized by GI at that time.  She has  known diverticulosis, and this was believed to be a diverticular bleed.  During that admission in 2004, she was treated conservatively, and the  bleeding stopped.  She had outpatient follow up by Dr. [name].  She is known  to have a history of adenomatous polyps in the past.  Her last colonoscopy  in December of 2002.  She also has a hiatus hernia and chronic  gastroesophageal reflux, for which she takes proton pump inhibitors.   PAST MEDICAL HISTORY:  She has a history of coronary artery disease with  cardiac catheterization in 2003 that showed a 40% circumflex lesion,  tortuous vessels, and ejection fraction of 60%.  She is followed by Dr.  [name].  At that time, she had a spiral CT of the chest, and no evidence  of  pulmonary emboli.  The CT did show an asymptomatic thyroid nodule.  She was  subsequently seen by Dr. Everardo All with a biopsy of the gland in October of  2004 that revealed a hyperplastic nodule, and this is being followed.  She  has a history of hypertension controlled on medications.  She has a history  of hypercholesterolemia, and is currently enrolled in a cardiac drug study.  She has a history of asthmatic bronchitis, and obstructive sleep apnea of a  moderate degrees, and was evaluated by [facility] in the past.  She uses CPAP at  night.  She has a history of degenerative arthritis, and had a left total  knee replacement in December of 2004 by Dr. Dorene Grebe.  She was on Coumadin  for 1 month around that time, which was discontinued in January of 2005.  She has previously been evaluated for a cervical radiculopathy by Dr.  Kelli Hope.  She is not currently taking any nonsteroidal  antiinflammatory drug.  She has a history of a remote right total knee  replacement, as well.  She has a history of some anxiety and depression, and  has been on Celexa and Klonopin.  She also takes Ambien for sleep.  She has  had previous gallbladder surgery, hysterectomy, appendectomy, and  tonsillectomy.  She has had cataract surgery in the past, and is being  followed for glaucoma.  She is allergic to penicillin and cephalosporins.   PHYSICAL EXAMINATION:  GENERAL:  A 77 year old black female in no acute  distress.  VITAL SIGNS:  Blood pressure of 140/80, pulse 66 per minute and regular,  respirations 20 per minute and not labored, temperature 98 degrees.  HEENT:  Unremarkable.  Could not see the fundi well.  NECK:  No jugular venous distention, no carotid bruits no thyromegaly or  lymphadenopathy.  There was a small thyroid nodule palpated in the lower  pole.  CHEST:  Clear to percussion and auscultation. CARDIAC:  A regular rhythm, grade 1/6 systolic  ejection murmur at the left  sternal border,  no rubs or gallops heard.  ABDOMEN:  Soft with intact bowel sounds.  Minimal left lower quadrant  tenderness on deep palpation.  Stool was red and positive for blood.  EXTREMITIES:  Evidence of previous bilateral total knee replacements,  degenerative changes.  No clubbing, cyanosis, or edema.  NEUROLOGIC:  Intact without focal abnormality detected.   LABORATORY DATA:  EKG showed normal sinus rhythm, nonspecific ST-T wave  changes, no acute abnormalities.  Chest x-ray revealed some cardiomegaly and  a tortuous aorta.  No focal infiltrates, signs of congestive heart failure,  degenerative changes in the spine noted.  Previous C-spine films showed  multilevel spondylitic changes with hypertrophy and some neural foraminal  encroachment.  Hemoglobin of 11.0, hematocrit 33.6, white count 5800, with a  normal differential.  The lowest hemoglobin of 10.3.  Hemoglobin prior to  discharge 10.4.  Pro time 12.6, INR 0.9, PTT 25 seconds.  Sodium 141,  potassium 3.5, chloride 110, CO2 of 30, BUN 20, creatinine 1.1, blood sugar  113, calcium 9.4.  TSH of 0.97.  Blood type O positive.  Negative antibody  screen.  Complete metabolic panel was not done as ordered, and will be  followed up in the office.   HOSPITAL COURSE:  The patient was admitted with suspected diverticular  bleed.  She was placed at bed rest, given IV fluids, and continued on her  blood pressure medications and Protonix.  Aspirin was held, and no  nonsteroidals were used.  She was given Tylenol for discomfort.  She was  seen in consultation by Dr. Lina Sar of the GI service.  She felt she  should have another colonoscopy, and this was performed on Dec 28, 2003.  The colonoscopy revealed an extensive left and right colon diverticulosis,  but no active bleeding sides.  She had multiple diminutive polyps, which  were biopsied, and the pathology is pending at the time of this dictation.  None were suspicious for colon cancers.  She will  follow up with Dr. [name]  in the office.  The patient was started on Metamucil, and instructed on how  to use this on a regular basis as an outpatient.  She was also told to avoid  aspirin and nonsteroidals for 2-4 weeks post discharge.  Her diet was slowly  advanced, and she was ambulated on the ward.  She felt stable and ready for  discharge on Dec 29, 2003.   CONDITION ON DISCHARGE:  Improved.   MEDICATIONS AT DISCHARGE:  1. Metamucil 1 tablespoon in water daily.  2. Over-the-counter iron tablet 325 mg p.o. daily.  3. Toprol-XL 50 one p.o. daily.  4. Cozaar 50 mg p.o. daily.  5. Plendil 5 mg p.o. daily.  6. Lasix 40 mg p.o. q.a.m.  7. K-Dur 20 one tablet p.o. daily.  8. Nexium 40 mg 1-2 tablets p.o. daily as directed.  9. Advair one puff b.i.d. as before.  10.      Albuterol two puffs q.4h. as needed for wheezing.  11.      Celexa 20 mg p.o. daily. 12.      Ambien 10 mg p.o. q.h.s. p.r.n. sleep.  13.      Klonopin 0.5 mg p.o. b.i.d. as needed for nerves.  14.      She will continue her CETP cholesterol drug study medication.   FOLLOW UP:  She was given an appointment to follow up and see me in 1 week  on Thursday, January 05, 2004, at 12 noon.  We will see to it that she has a  follow up with Dr. [name] after that.                                               Lonzo Cloud. Kriste Basque, M.D. [facility]    SMN/MEDQ  D:  12/29/2003  T:  12/29/2003  Job:  [id number]   cc:   Venita Lick. [name], M.D. [facility]

## 2010-12-21 NOTE — Discharge Summary (Signed)
[place]  Patient:    [name], [name]                      MRN: [id number] Adm. Date:  [id number] Disc. Date: [id number] Attending:  Herold Harms Dictator:   Junie Bame, P.A. CC:         Reynolds Bowl, M.D.  Lonzo Cloud. Kriste Basque, M.D. [facility]   Discharge Summary  DISCHARGE DIAGNOSES: 1. Status post right total knee replacement. 2. Hypotension. 3. Urinary tract infection (Proteus). 4. Bronchitis. 5. Gastroesophageal reflux disease.  HISTORY OF PRESENT ILLNESS:  The patient is a 77 year old black female admitted on February 24, 2001, for right total knee replacement due to end-stage OA by Reynolds Bowl, M.D.  No significant postoperative complications except for elevated temperatures.  The patient is on Coumadin for DVT prophylaxis. PT report indicated that the patient was ambulating steady with modified assist 15 feet x 2 with standard walker and could transfer sit to sit with minimum assist.  PAST MEDICAL HISTORY:  This is significant for hypertension, GERD, hiatal hernia, hypercholesterolemia, allergies and bronchitis, diverticulosis, depression.  PAST SURGICAL HISTORY:  Significant for hysterectomy and foot tumor removed.  MEDICATIONS PRIOR TO ADMISSION: 1. Toprol XL 50 mg q.d. 2. Hyzaar 50/12.5 mg q.d. 3. Prevacid 30 mg b.i.d. 4. Lipitor 10 mg p.o. q.d. 5. Ambien 10 mg as needed for sleep. 6. Plendil 5 mg q.d. 7. Albuterol inhalers and Flovent inhalers as needed. 8. Allegra 6 mg b.i.d.  ALLERGIES:  KEFLEX and PENICILLIN.  SOCIAL HISTORY:  The patient [place] and [place] a one-level home with three steps to entry.  She was independent prior to admission. She was retired.  Her son is unable to assist due to his having multiple sclerosis.  She denies any alcohol or tobacco use.  HOSPITAL COURSE:  The patient was admitted to rehab on February 26, 2001, for comprehensive inpatient rehabilitation  where she received more than three hours of PT and OT daily.  [name] did fairly well during her hospital course in her rehabilitation.  Hospital course is significant for urinary tract infection, increased temperatures, and mild constipation.  The patient remained on Coumadin during her entire stay for DVT prophylaxis. She was started on Cipro on February 28, 2001, 250 mg b.i.d. for seven day for urinary tract infection.  On February 27, 2001, the patient was started on K-Dur 20 mEq p.o. b.i.d. due to hypokalemia.  Her blood pressure remained fairly stable during her entire stay of rehab.  She remained on Nexium for GI prophylaxis while on Coumadin during her entire stay in rehab.  Besides mild constipation, there were no other major complications during her stay.  Her fevers began to resolve on day #4 of the Cipro.  Latest labs indicated that her hemoglobin was 9.5, hematocrit was 28.1, platelet count was 152, white blood cell count was 9.6.  Latest potassium level was 3.8, sodium was 140, glucose 103, BUN 12, and creatinine 0.8.  Her urine culture came back positive for Proteus mirabilis on February 27, 2001, 60,000 colonies.  Venous Dopplers were performed on February 26, 2001, which demonstrated no evidence of DVT, superficial thrombus or Bakers cyst.  At the time of discharge her temperature was 97.8, blood pressure was 114/56.  INR was 2.7.  Physical examination was unremarkable.  Her knee demonstrated no signs of infection.  Staples were removed and Steri-Strips were applied.  PT report indicated the patient was ambulating modified independently up to 150 feet with standard walker, can transfer sit to stand with modified independence. She performed all ADLs with modified independence.  The CPM demonstrated the patient had flexion at 75 degrees.  The patient was discharged home with her family.  DISCHARGE MEDICATIONS:  1. Toprol XL 50 mg one tablet per day.  2. Hyzaar 50/12.5 mg one tablet  twice a day.  3. Nexium 40 mg one tablet daily.  4. Lipitor 10 mg one tablet daily.  5. Plendil 5 mg one tablet daily.  6. Ambien 10 mg one tablet at night.  7. OxyContin 10 mg one tablet every 12 hours.  8. Oxycodone 5 mg one tablet every four to six hours as needed for pain.  9. Coumadin 2 mg one half pill per day through March 27, 2001, then stop. 10. Cipro 250 mg one p.o. twice a day x 2 days.  ACTIVITY:  She is to use a walker.  DIET:  No restrictions.  SPECIAL INSTRUCTIONS:  She is to take no aspirin, ibuprofen, or Aleve while on Coumadin.  No smoking, no alcohol.  No driving.  Home health will monitor her Coumadin.  First draw will be March 06, 2001. She is to follow up with Reuel Boom L. Thomasena Edis, M.D., as needed, follow up with Dr. Criss Alvine on March 10, 2001.  We have called for time.  She is to follow up with Lonzo Cloud. Kriste Basque, M.D., within four to six weeks. DD:  03/08/01 TD:  03/10/01 Job: [id number]

## 2010-12-21 NOTE — Cardiovascular Report (Signed)
NAME:  [name], [name] NO.:  [id number]   MEDICAL RECORD NO.:  [id number]          PATIENT TYPE:  OIB   LOCATION:  6501                         FACILITY:  [facility]   PHYSICIAN:  Salvadore Farber, M.D. LHCDATE OF BIRTH:  03-16-1928   DATE OF PROCEDURE:  08/30/2004  DATE OF DISCHARGE:                              CARDIAC CATHETERIZATION   PROCEDURE:  Left heart catheterization, left ventriculography, coronary  angiography, intravascular ultrasound of the circumflex.   INDICATIONS:  Follow-up for CETP study.   PROCEDURAL TECHNIQUE:  Informed consent was obtained. Under 1% lidocaine  local anesthesia, a 6-French sheath was placed in right common femoral  artery using modified Seldinger technique. 70 units/kg of heparin was  administered. ACT was confirmed to be greater than 275 seconds. Diagnostic  angiography of the right system was performed using 6-French JR-4 diagnostic  catheter. Left heart catheterization and ventriculography were then  performed using a pigtail catheter. Diagnostic angiography of the left  system was then performed using a 6-French EBU 3.75 coronary guide. Luge  wire was advanced into the distal portion of the inferior branch of the  large obtuse marginal. 200 mcg of intracoronary nitroglycerin was  administered. Angiography was repeated. IVUS catheter was then advanced over  the wire and positioned to begin in an identical spot to the index  procedure. Automated pullback was then performed. Catheter and wires were  then removed. The patient tolerated the procedure well stress involving room  in stable condition. Sheaths would be removed there.   COMPLICATIONS:  None.   FINDINGS:  1.  LV: 140/07/15. EF 60% with focal inferior hypokinesis.  2.  Left main: Angiographically normal.  3.  LAD: Very large vessel which wraps around the apex to supply much of the      inferior wall. It gives rise to a single large diagonal branch. It is  angiographically normal.  4.  Circumflex: Large, technically dominant vessel. It gives rise to a      single large branching obtuse marginal and the PDA. There is a 30% focal      stenosis of the distal circumflex prior to the takeoff of the PDA.  5.  RCA: Small, nondominant vessel. It is angiographically normal.   IMPRESSION/RECOMMENDATION:  Mild, nonobstructive coronary disease. No change  compared with December 2003.      WED/MEDQ  D:  08/30/2004  T:  08/30/2004  Job:  [id number]   cc:   Lonzo Cloud. Kriste Basque, M.D. [facility]   Charlton Haws, M.D.

## 2010-12-21 NOTE — Cardiovascular Report (Signed)
NAME:  [name], [name]                         ACCOUNT NO.:  [id number]   MEDICAL RECORD NO.:  [id number]                   PATIENT TYPE:  OIB   LOCATION:  2899                                 FACILITY:  [facility]   PHYSICIAN:  Salvadore Farber, M.D. [facility]         DATE OF BIRTH:  08-14-1927   DATE OF PROCEDURE:  07/09/2002  DATE OF DISCHARGE:                              CARDIAC CATHETERIZATION   PROCEDURES:  Left heart catheterization, left ventriculogram, coronary  angiography, Perclose closure of right femoral arteriotomy, and  intravascular ultrasound of the circumflex as part of the CETP study.   INDICATIONS:  The patient is a 77 year old lady who presents with several  months of increasing fatigue and occasional chest pain for which she has  taken sublingual nitroglycerin.  This has increased in frequency over the  past several months.  An adenosine Cardiolite demonstrated a small apical  infarct with evidence of a mild ischemia in the inferior wall with ejection  fraction of 67%.  She was therefore referred for diagnostic angiography.  Prior to the procedure, the patient consented to participate in the CETP  intravascular ultrasound study with statin therapy.   DIAGNOSTIC TECHNIQUE:  Informed consent was obtained. The patient consented  as well to participate in the CETP study.  Under 1% lidocaine local  anesthesia, a 6 French sheath was placed in the right femoral artery using  the modified Seldinger technique.  Diagnostic angiography and  ventriculography was performed using JL4, JR4, and 6 French pigtail  catheters.  I then proceeded to intravascular ultrasound interrogation of  the circumflex.  Anticoagulation was initiated with heparin to achieve an  ACT of greater than 250 seconds.  A CLS 4 guide was advanced over a wire and  engaged in the ostium of the left main coronary artery. A BMW wire was  advanced into the distal portion in the inferior margin of OM-1.  A total of  200 mcg of intracoronary nitroglycerin was then administered.  The Atlantis  IVUS system was then advanced into the midportion of the inferior branch of  OM-1.  Automated pullback was obtained to the entirety back into the guide.  IVUS catheter and guide wire were then removed.  Final angiograms  demonstrated no change in the coronaries and no spasm.  The right femoral  arterial puncture site was then closed using a 6 [place] Perclose system.  The patient tolerated the procedure well and was transferred to the holding  room in stable condition.   COMPLICATIONS:  None.   DIAGNOSTIC FINDINGS:  1. EF equals 60-65% without regional wall motion abnormality.  2. There was no aortic stenosis.  I am unable to assess mitral regurgitation     due to ventricular ectopy during ventriculography.  3. Left main:  Angiographically normal.  4. LAD:  The vessel is very tortuous proximally.  It gives rise to a single     diagonal branch.  The  LAD is very long and it supplies the majority of     the inferior wall.  5. Circumflex:  The circumflex is the technically dominant vessel though the     LAD supplies the majority of the inferior wall.  There is a 40% stenosis     in the distal circumflex prior to the PDA.  6. RCA:  The RCA is a small, nondominant vessel without disease.  7. LV:  190/15/23.    IMPRESSION/PLAN:  The patient has minimal coronary artery disease which does  not account for her chest pain and fatigue.  We will continue primary  prevention. We will plan on discharge home later this afternoon.   PLAN:  @@                                                     Salvadore Farber, M.D. [facility]    WED/MEDQ  D:  07/09/2002  T:  07/10/2002  Job:  [id number]   cc:   Charlton Haws, M.D. [facility]   Reynolds Bowl, M.D.  [address]  [place], [place] [address]  Fax: [phone]

## 2010-12-21 NOTE — Discharge Summary (Signed)
NAME:  [name], [name] NO.:  [id number]   MEDICAL RECORD NO.:  [id number]          PATIENT TYPE:  INP   LOCATION:  0372                         FACILITY:  [facility]   PHYSICIAN:  Lonzo Cloud. Kriste Basque, M.D. [facility] OF BIRTH:  07/21/1928   DATE OF ADMISSION:  09/25/2004  DATE OF DISCHARGE:  09/30/2004                                 DISCHARGE SUMMARY   FINAL DIAGNOSES:  1.  Admitted September 25, 2004, with sudden onset of dark maroon blood per      rectum; lower gastrointestinal bleed likely secondary to diverticulosis      - gastroenterology consultation with Dr. Lina Sar, recommended      conservative management and observation.  Gastroenterology follow up in      the office is planned.  2.  Anemia secondary to above.  Hemoglobin 11.1 on admission, dropped to a      low of 8.9 and improved to 9.1 prior to discharge.  The patient placed      on iron therapy and will be followed in the office.  3.  History of previous lower gastrointestinal bleeding secondary to      diverticulosis; colonoscopy in May 2005 revealing extensive left and      right colon diverticulosis, and multiple diminutive polyps.  She has had      similar bleeding episodes in March 2004 and in May 2005.  4.  History of hiatus hernia and gastroesophageal reflux disease - on      Nexium.  5.  History of nonobstructive coronary artery disease with cardiac      catheterization in December 2003, showing a 40% circumflex lesion,      tortuous vessels, and an ejection fraction of 60%.  6.  History of hypertension - controlled on medications.  7.  History of hyperlipidemia - the patient was enrolled in a cardiac drug      study.  8.  History of asthmatic bronchitis and obstructive sleep apnea with      previous evaluation by Dr. Shelle Iron - the patient uses CPAP at home.  9.  History of degenerative arthritis with previous left total knee      replacement in December 2004 by Dr. Dorene Grebe.  She was on deep  venous      thrombosis prophylaxis with Coumadin for about a month after that, but      all anticoagulants were discontinued in January 2005.  She also has a      history of cervical radiculopathy with neurologic evaluation by Dr. Jacki Cones in the past.  10. History of thyroid nodule which was found on CT scan of the chest done      to rule out pulmonary embolism when she presented with chest pain in the      past.  This lesion was biopsied in October 2004 by Dr. Everardo All and found      to be a hyperplastic nodule and is being followed.  11. History of anemia - as noted.  12. Status post cholecystectomy, hysterectomy, appendectomy, tonsillectomy,  and cataract surgery.  13. History of glaucoma, on drops.  14. Allergy to PENICILLIN and CEPHALOSPORINS.   BRIEF HISTORY AND PHYSICAL:  The patient is a 77 year old black female known  to me and Dr. Samule Ohm from cardiology and Dr. [name] for GI.  She presented to  the emergency room on September 25, 2004, with sudden onset of dark maroon,  bloody stools.  She had a history of lower GI bleeding in 2004 and 2005 as  noted above.  These were believed secondary to diverticulosis and with  similar presentation, she has stopped spontaneously in the past.  The  patient states she had no problems on the day prior to admission.  On the  morning of admission, she had dark, bloody clots, slight nausea without  vomiting, and vague lower abdominal cramping.  She denied fevers, chills, or  sweats.  In the emergency room, she was evaluated by Dr. Read Drivers of the ER  staff, found to be hemodynamically stable with dark maroon stool per rectum  and was referred for admission.   PAST MEDICAL HISTORY:  As noted, she has a history of diverticulosis and  colon polyps on her last colonoscopy in 2002 by Dr. [name] and then again in  the May 2005 admission by Dr. Lina Sar.  She has a hiatus hernia,  gastroesophageal reflux disease.  She has history of  nonobstructive coronary  artery disease, hypertension, and hyperlipidemia, as noted.  She has a  history of asthmatic bronchitis and obstructive sleep apnea and uses CPAP at  night.  She has degenerative arthritis with a previous total knee  replacement and cervical spine disease with radiculopathy previously  evaluated by neurology.  She has a thyroid nodule which is a benign  hyperplastic nodule and biopsied by Dr. Everardo All and a history of chronic  anemia.   PHYSICAL EXAMINATION:  Physical examination at the time of admission  revealed an elderly black female in no acute distress.  Blood pressure  136/76, pulse 98 and regular, respirations 20 per minute and not labored,  temperature 98 degrees.  O2 saturation was 95% on room air.  HEENT exam  unremarkable.  Neck exam showed no jugular venous distention, no carotid  bruits.  Thyroid nodule palpated without change.  No lymphadenopathy.  Chest  exam was clear to percussion and auscultation.  Cardiac exam revealed a  regular rhythm, grade 1/6 systolic ejection murmur in left sternal border.  No rubs or gallops heard.  The abdomen was soft with intact bowel sounds and  nontender on evaluation.  There was no guarding or rebound.  There was no  evidence of hepatosplenomegaly.  She had dark maroon stool that was flash  positive on Hematest.  Extremities showed some degenerative arthritis,  previous total knee replacement.  Neurologic exam was intact without focal  abnormalities detected.  Skin exam was negative without lesions noted.   LABORATORY DATA:  EKG showed normal sinus rhythm, nonspecific ST-T wave  changes, no acute abnormalities.  Chest x-ray showed slight cardiomegaly and  some dilatation of the aorta, clear lung fields, diffuse bony spurring in  the thoracic spine, some chronic pleural changes of the right costal frank  angle, no acute disease.  Blood gas with pH 7.40, pCO2 42.6, pO2 79 on room air.  Hemoglobin 11.1, hematocrit  34.2, white count 5900, platelet count  200,000.  Hemoglobin dropped to 8.9 during the hospitalization and improved  to 9.1 prior to discharge with stabilization.  Pro time 12.7, INR 0.9, PTT  26 seconds.  Sodium 139, potassium 3.4, repeat 3.7 with supplementation.  Chloride 104, CO2 28, BUN 20, creatinine 1.2, blood sugar 122, calcium 9.1,  total protein 6.4, albumin 3.0, AST 25, ALT 16, alk phos 62, total bilirubin  1.2.  CPK 97 with negative MB and negative troponin.  Beta natriuretic  peptide less than 30.  Cholesterol 120, triglycerides 86, HDL 53, LDL 50,  TSH 0.73.  Serum iron 31, TIBC 280 for a saturation of 11%.  Ferritin was  18.  Urinalysis showed a few white cells and bacteria.  Blood type is O  positive.  Urine culture grew 100,000 colonies of E. coli that was sensitive  to everything.   HOSPITAL COURSE:  The patient was admitted with lower GI bleed.  It was  believed to be similar to her previous admissions for diverticular bleeds.  Both of these previous bleeds had stopped with conservative management.  She  was placed on IV fluids, given IV Protonix, and continued on her home  medications.  Blood counts were followed carefully.  Consultation was  obtained from the GI service, and she was seen by Dr. Lina Sar.  She  recommended the conservative management.  Her blood count dropped to a low  of 8.9 but improved to 9.1 with whole iron therapy.  Her diet was slowly  advanced as she stabilized and she was able to tolerate a soft diet.  She  had no further bleeding after the initial blood cleared from her colon.   Her other laboratory data indicated E. coli urinary tract infection, and she  was started on Cipro 250 p.o. b.i.d., and we will continue this for a 7 day  course.  Blood pressure remained stable throughout the hospital stay, and  she had no cardiac problems.   DISCHARGE MEDICATIONS:  1.  Cipro 250 mg p.o. b.i.d. until gone.  2.  Toprol XL 50 mg 1 p.o. daily.  3.   Plendil 5 mg p.o. daily.  4.  Cozaar 50 mg p.o. daily.  5.  Lasix 40 mg p.o. daily.  6.  K-Dur 20, 1 tab p.o. b.i.d.  7.  Nexium 40 mg p.o. daily.  8.  Nu-Iron 150 mg p.o. b.i.d.  9.  Advair 250/50, 1 puff b.i.d.  10. Albuterol metered dose inhaler q.4h. p.r.n.  11. Allegra 60 mg p.o. b.i.d. p.r.n. allergy symptoms.  12. Enteric-coated aspirin 81 mg p.o. daily.  13. Celexa 20 mg p.o. daily.  14. Ambien 5 mg p.o. q.h.s. p.r.n.  15. She was instructed to continue her glaucoma eye drops as before.  She      will use Tylenol as needed for pain.   She was instructed on a no salt, soft diet and will follow up with me in the  office on Friday, October 05, 2004, at 12:30 p.m.  We will arrange for GI  follow up after that.   CONDITION ON DISCHARGE:  Improved.      SMN/MEDQ  D:  09/30/2004  T:  09/30/2004  Job:  [id number]

## 2010-12-21 NOTE — Op Note (Signed)
[facility]  Patient:    Jenny, Guzman                        MRN: [id number] Proc. Date: 02/24/01 Attending:  Reynolds Bowl, M.D.                           Operative Report  PREOPERATIVE DIAGNOSIS: Osteoarthritis of right knee.  POSTOPERATIVE DIAGNOSIS: Osteoarthritis of right knee.  OPERATION/PROCEDURE: Right total knee arthroplasty using Osteonics components with cementing of the tibial tray and patellar button.  SURGEON: Reynolds Bowl, M.D.  ASSISTANT: Colon Flattery. Ollen Bowl, M.D.  ANESTHESIA: General.  DESCRIPTION OF PROCEDURE: The patient was given 1 g of vancomycin IV and 500 mg of gentamicin.  She was then given a general anesthetic via endotracheal tube and a Foley catheter was put in place.  All bony areas were padded and a roll was put under her rip hip.  A tourniquet was placed high on her thigh and isolated with a U-drape, and the right lower extremity from the edges of the drape to the tips of the toes prepped with DuraPrep.  She was then draped in the usual manner, and this included the use of two Ioban drapes. The right knee was opened through a straight anterior incision.  The subcutaneous tissues were dissected medially, exposing the median retinaculum and then the knee was opened through the medial retinaculum beginning with a small part of a quadriceps tendon and carried down along the medial retinaculum and along the medial edge of the patellar tendon.  The patella was then everted.  We saw a large amount of thickened synovium and the weightbearing areas of the tibia were basically down to eburnated bone.  The distal femur was cut and was resected 10 mm at 5 degrees of valgus using an intramedullary rod.  The sizing guide determined the femoral size to size #7.  The various chamfers were then cut with the chamfer guide in three degrees of external rotation using the guide as well as the transepicondylar axis as a guide.   Having completed these chamfers the notch for the Scorpio component was made in the femoral notch.  The menisci and the cruciates were excised and the upper tibia was exposed. Then it was resected 10 mm with a 5 degree posterior tilt using the intramedullary guide.  We then used trials and determined that a #7 femur, #7 tibial tray, and 10 mm tibial tray insert bearing allowed full flexion and extension, good stability in extension and good stability in flexion, and 5 degrees of valgus alignment using the guides.  Attention was then directed to the patella, which was resected approximately 10 mm.  Then we used a #7 patellar guide and drilled the peg holes.  Having done all of this, the permanent femoral component size 7 was hammered into place, a very snug fit, then the tibia was cemented along with its fins after pulsatile lavage, drying the area, painting the top of the exposed tibial surface with cement, and painting the horizontal portion of the tray with cement.  This was well seated all the way around and all excess cement removed.  I then cemented the patellar button and as the cement set I found that I had not gotten the patellar button seated as well as I thought.  Therefore, I removed this along with some set bone and set cement, reassessed, and  decided on a size #5 patellar button, and the appropriate peg holes were drilled and this button then cemented.  All excess cement was removed and when this cement was set we then ran the knee through full flexion and extension.  The patella tracked well without any additional support.  The knee continued to have good stability and varus valgus, and by checking the alignment rods the alignment was good.  At this point we let the tourniquet down and did further pulsatile lavage, and removed all bits of debris.  There were a few venous bleeders, which were Bovied.  Then the knee was closed in layers using figure-of-eight sutures of #1  Vicryl for the retinaculum, 2-0 Vicryl for the more subcutaneous tissues, and metal staples for the skin edges.  One suction Hemovac was placed just superficial to the retinaculum and brought out superolaterally through the skin.  The skin was injected along its edges with Marcaine with epinephrine. Xeroform gauze and 4 x 4s, ABDs, followed by Kerlix wrap, Ace wrap, and application of a knee immobilizer followed.  The patient was returned to the recovery room in good condition.  ESTIMATED BLOOD LOSS: Estimated blood loss during this procedure was approximately 300 cc.  None was replaced. DD:  02/24/01 TD:  02/24/01 Job: [id number]

## 2010-12-21 NOTE — Discharge Summary (Signed)
NAME:  [name], [name]                         ACCOUNT NO.:  [id number]   MEDICAL RECORD NO.:  [id number]                   PATIENT TYPE:  INP   LOCATION:  0357                                 FACILITY:  [facility]   PHYSICIAN:  Barbette Hair. Arlyce Dice, M.D. [facility]          DATE OF BIRTH:  03-Oct-1927   DATE OF ADMISSION:  09/23/2002  DATE OF DISCHARGE:  09/25/2002                                 DISCHARGE SUMMARY   ADMITTING DIAGNOSES:  1. The patient is a 77 year old female with recurrent rectal bleeding most     consistent with a diverticular bleed.  2. History of diverticular hemorrhage.  3. Jaw pain acute x3-4 days, etiology not clear, rule out dental infection,     gingivitis, etc.  4. History of hypertension.  5. Dyslipidemia.  6. Glaucoma.  7. Mild coronary artery disease.  8. Anemia acute secondary to above.   DISCHARGE DIAGNOSES:  1. Resolving diverticular bleed, recurrent.  2. Anemia secondary to above.  3. Right mandibular pain secondary to a hypererupted tooth occluding on the     gingiva.  4. History of hypertension.  5. Dyslipidemia.  6. Glaucoma.  7. Mild coronary artery disease.  8. Anemia acute secondary to above.   CONSULTATION:  Dr. Dorcas Carrow, dental surgery.   BRIEF HISTORY:  The patient is a pleasant 77 year old African-American  female with known history of diverticulosis and a prior GI bleed secondary  to diverticuli.  Her last colonoscopy was in December 2002 per Dr. [name]  which showed diverticulosis in the ascending to sigmoid colon and also two  small polyps in the transverse colon.  On the morning of admission she had  awakened about 7:30 a.m. with dark red blood noted in her bowel movement and  then had a second episode shortly thereafter which was all blood.  She had  not noted any melena prior to this, had no significant complaint of  abdominal pain, cramping, nausea, vomiting, did feel lightheaded but not  syncopal.  Over the past few hours she has not  had a bowel movement but came  to the emergency room for evaluation and was hemodynamically stable.  Hemoglobin 11.1.  She was also complaining of acute right jaw pain which had  been bothering her for a couple of days.  She said she was supposed to see  her dentist today however, with the bleeding had come on to the emergency  room.   LABORATORY STUDIES:  On admission WBC 6.6, hemoglobin 11.1, hematocrit of  34.0, MCV of 87, platelets 171.  Serial values were obtained on 09/24/2002  hemoglobin 11, hematocrit of 34.0; later that day hemoglobin 10.7,  hematocrit of 32.8; on 09/25/2002 hemoglobin 10.1, hematocrit of 31.2.  Prothrombin time 14.1, INR of 1.1, PTT of 26.  Electrolytes on admission  within normal limits, glucose 125, BUN 15, creatinine 1.1, albumin of 3.2.  Liver function studies were normal.  Blood type O  positive.   RADIOGRAPHIC STUDIES:  Right mandibular films showed areas of radiolucency  of the mandible; no definite mass; there were missing teeth noted in the  mandible as well.   HOSPITAL COURSE:  The patient was admitted to the service of Dr. Leone Payor to  telemetry, she was placed on a clear liquid diet, kept at bed rest, kept on  IV fluids and serial H&H's were obtained.  She was continued on the majority  of her home medicines with the exception of aspirin.  She was  hemodynamically stable at the time of admission.  Her primary complaint of  pain was that of right jaw pain.  She was given Vicodin for that and due to  the acute onset of this pain we did ask dental surgery to see her.  She was  seen by Dr. Egbert Garibaldi who obtained films of the right mandible and evaluated her  on 09/23/2002.  She was felt to have a hypererupted #2 occluding on the  gingiva in the lower right mandible.  He recommended Peridex swish and  swallow, Viscous Xylocaine, q.3-4h. p.r.n. and was to follow up with him as  an outpatient for removal of this tooth.  The patient had a benign hospital   course.  She did have a couple further episodes of rectal bleeding but  remained very stable.  Her hemoglobin leveled out in the 10 range.  She did  not require transfusion.  On 09/24/2002 she was feeling better from a  bleeding standpoint but still having her jaw pain and had a low-grade  temperature.  By 09/25/2002 she was feeling much better.  Hemoglobin  remained stable.   FOLLOW UP:  She was to follow up with Dr. [name] in the office on 10/06/2002  at 11 a.m.  She was asked to make a followup appointment with Dr. Egbert Garibaldi as he  had instructed.   DIET:  Regular low fat.   MEDICATIONS:  Hold aspirin for 2 weeks.  All other medications as previous.  Vicodin one every 4-6 hours as needed for mandibular pain.  Viscous  lidocaine 2% 3 mL swish and spit q.3-4h. and Peridex rinse 15 mL twice  daily.     Mike Gip, P.A.-C. [facility]                Robert D. Arlyce Dice, M.D. [facility]    AE/MEDQ  D:  10/08/2002  T:  10/09/2002  Job:  [id number]   cc:   Cornelius Moras. Egbert Garibaldi, D.D.S.  [address]  [place]  [place] [address]  Fax: [phone]

## 2011-02-01 ENCOUNTER — Encounter: Payer: Self-pay | Admitting: Gastroenterology

## 2011-02-04 ENCOUNTER — Other Ambulatory Visit: Payer: Self-pay | Admitting: Pulmonary Disease

## 2011-02-18 ENCOUNTER — Encounter: Payer: Self-pay | Admitting: Pulmonary Disease

## 2011-02-18 ENCOUNTER — Ambulatory Visit (INDEPENDENT_AMBULATORY_CARE_PROVIDER_SITE_OTHER): Payer: Medicare Other | Admitting: Pulmonary Disease

## 2011-02-18 DIAGNOSIS — K449 Diaphragmatic hernia without obstruction or gangrene: Secondary | ICD-10-CM

## 2011-02-18 DIAGNOSIS — R002 Palpitations: Secondary | ICD-10-CM

## 2011-02-18 DIAGNOSIS — K219 Gastro-esophageal reflux disease without esophagitis: Secondary | ICD-10-CM

## 2011-02-18 DIAGNOSIS — I2510 Atherosclerotic heart disease of native coronary artery without angina pectoris: Secondary | ICD-10-CM

## 2011-02-18 DIAGNOSIS — Z860100 Personal history of colon polyps, unspecified: Secondary | ICD-10-CM

## 2011-02-18 DIAGNOSIS — G4733 Obstructive sleep apnea (adult) (pediatric): Secondary | ICD-10-CM

## 2011-02-18 DIAGNOSIS — E7800 Pure hypercholesterolemia, unspecified: Secondary | ICD-10-CM

## 2011-02-18 DIAGNOSIS — K5730 Diverticulosis of large intestine without perforation or abscess without bleeding: Secondary | ICD-10-CM

## 2011-02-18 DIAGNOSIS — M1990 Unspecified osteoarthritis, unspecified site: Secondary | ICD-10-CM

## 2011-02-18 DIAGNOSIS — F411 Generalized anxiety disorder: Secondary | ICD-10-CM

## 2011-02-18 DIAGNOSIS — I10 Essential (primary) hypertension: Secondary | ICD-10-CM

## 2011-02-18 DIAGNOSIS — Z23 Encounter for immunization: Secondary | ICD-10-CM

## 2011-02-18 DIAGNOSIS — E041 Nontoxic single thyroid nodule: Secondary | ICD-10-CM

## 2011-02-18 DIAGNOSIS — M8668 Other chronic osteomyelitis, other site: Secondary | ICD-10-CM

## 2011-02-18 DIAGNOSIS — E559 Vitamin D deficiency, unspecified: Secondary | ICD-10-CM

## 2011-02-18 NOTE — Progress Notes (Signed)
Subjective:    Patient ID: [name], female    DOB: [birth year], 77 y.o.   MRN: [id number]  HPI 77 y/o BF here for a 6 month follow up visit...  she has multiple medical problems as noted below...  Followed for general medical purposes w/ hx of OSA, HBP, CAD, Hyperchol, Multinod thyroid, HH, Divertics & polyps, DJD, Anxiety, etc...  ~  [name]:  her CC is "tired- no energy" & we discussed checking blood work... needs to incr exercise program/ mobility, take MVI/ VitD... she saw DrNishan 11/10 for f/u HBP, palpit, Lipids> event recorder showed PACs/ PVCs only & no med changes made... she has severe DJD & bilat TKRs> uses Mobic Prn... still under the care of DrPlovsky for psyche- & remains on Wellbutrin, Celexa, Lorazepam... ~  Jul11:  she fell recently & hurt her left hand w/ some persist tingling & she will check w/ her Ortho, DrDean... otherw stable- using CPAP & tol well;  BP controlled on meds;  no CP/ angina;  Lipids controlled on Lip10;  GI is stable;  DJD on Mobic; and nerve meds changed by DrPlovsky...   ~  August 16, 2010:  she has hx divertics/ IBS & developed some diarrhea & fecal incontinence several times per week & at night> DrStark tried to refer her to [facility] for the needed studies but she misunderstood somehow, anyway she says all symptoms better w/ regular dosing of Metamucil... she is overall improved> has a new dog= poodle mix "Keebler" & it helps her to walk... BP controlled on meds;  denies CP, palpit, SOB, etc;  f/u labs all look good;  she notes she is due for GYN check w/ DrNeal & will inquire about BMD as well...  ~  February 18, 2011:  60mo ROV & [name] is stable overall> notes some memory problems and has had some intermittent palpitations- she's been evaluated by Walker Kehr for Cards (seen 2/12) & event monitor revealed some PACs & PVCs- asked to take an extra 1/4 of the ToprolXL as needed...  She also had GI eval by DrEdwards for mult GI symptoms- gas, fecal incont- w/  Colonoscopy 5/12 showing divertics, hems, otherw neg & bx was neg/benign no microscopic colitis;  Asked to decr milk products, given Hyoscyamine 0.125mg  SL Qid prn...  Given TDAP vaccine today.    Problem List:  GLAUCOMA - on eye drops from DrShapiro...  HEARING LOSS - saw DrByers for hearing eval & they discussed hearing aides.  CHRONIC OSTEOMYELITIS OTHER SPECIFIED SITES (ICD-730.18) - she developed osteomyelitis of the right mandible related to a tooth abcess and path showed bact colonies consistant w/ Actinomyces- oral surg= DrRehm & seen by ID- DrFitzgerald and was treated w/ Clindamycin (due to Pen allergy)...  she stopped the Clindamycin on her own... last seen by DrFitzgerald 4/10 (54mo off Clinda) and doing satis- he released her... still follows up w/ DrRehm for OralSurg...  OBSTRUCTIVE SLEEP APNEA (ICD-327.23) - mod OSA w/ sleep study 8/04 showing RDI=23, consult DrClance... Rx CPAP 10... states she is using CPAP regularly and tol well...  BRONCHITIS, RECURRENT (ICD-491.9) - no recent upper resp infection... ~  CXR 1/11 showed clear lungs, WNL...  HYPERTENSION (ICD-401.9) - controlled on TOPROL XL 50mg /d, PLENDIL 5mg /d, HYZAAR 100/25 daily, & KCl Bid... BP=136/74 and asymptomatic- denies HA, fatigue, visual changes, CP, palipit, dizziness, syncope, dyspnea, edema, etc... she has a long hx of chr noncompliance w/ med Rx...  CAD (ICD-414.00) - non-obstructive disease w/ cath 1/06 showing 30%  focal stenosis in CIRC... NuclearStressTest 9/07 showed apical thinning, no ischemia, EF=69%... followed by Walker Kehr for her HBP, palpit, lipids> Event recoder showed benign PVCs & PACs, no change in med Rx... she can't take ASA, she says.  HYPERCHOLESTEROLEMIA (ICD-272.0) - on LIPITOR 10mg /d...  ~  FLP 8/08 w/ TChol 174, TG 76, HDL 65, LDL 94 ~  FLP 7/09 showed TChol 173, TG 76, HDL 57, LDL 101 ~  FLP 7/10 showed TChol 174, TG 94, HDL 57, LDL 99 ~  FLP 7/11 showed TChol 166, TG 90, HDL 58,  LDL 91 ~  FLP 1/12 showed TChol 158, TG 91, HDL 53, LDL 86  Hx of THYROID NODULE (ICD-241.0) - she has multinodular thyroid, no palp nodules, clinically euthyroid & doing well...  ~  labs 8/08 showed TSH = 0.87 ~  labs 7/09 showed TSH = 0.70 ~  labs 7/10 showed TSH= 1.12 ~  labs 1/11 showed TSH= 1.06 ~  labs 1/12 showed TSH= 1.53  HIATAL HERNIA (ICD-553.3) - on NEXIUM 40mg /d... HH on prev CTscans... EGD in 1998 w/ distal esoph stricture dilated... no recurrent dysphagia etc... ~  last EGD 7/09 by DrStark showed 4cmHH, GERD, gastritis & gastic polyps- neg HPylori... Rx w/ NEXIUM 40mg Bid ==> then weaned to one per day.  DIVERTICULOSIS OF COLON (ICD-562.10), & COLONIC POLYPS, ADENOMATOUS, HX OF (ICD-V12.72) - colonoscopy 5/05 by DrStark w/ several sm 3-4 mm polyps, divertics, & f/u planned 77yrs... hosp in 2006 w/ divertic bleed that resolved spont... repeat hosp and f/u colonoscopy 7/09 showed divertics, and several 4-81mm adenomatous polyps... ~  11/11:  eval by DrStark for diarrhea & fecal incontinence> he rec refer to [facility], she declined & says she improved w/ METAMUCIL daily. ~  5/12:  She sought second opinion GI from DrEdwards> colonoscopy w/ divertics, hems, no colitis; Asked to decr milk products, given Hyoscyamine 0.125mg  SL Qid prn...  DEGENERATIVE JOINT DISEASE (ICD-715.90) - followed by DrDean... S/P Left TKR in 12/04 by DrDean & Right TKR 7/02 by DrKrege... known CSpine and L/S spine disease... s/p recent epid steroid shot in lower back w/ improvement... we refilled her MOBIC 7.5mg  for as needed use. ~  8/11:  she fell & saw DrDean & DrNewton for pain & paresthesias in left arm & improved w/ CSpine injection.  BACK PAIN, LUMBAR (ICD-724.2)  VITAMIN D DEFICIENCY (ICD-268.9) - Vit D level Jul09 = 19 & started on Vit D 50K weekly... ~  labs 7/10 showed Vit D level = 33... OK to switch to OTC Vit D 1000 u daily, also takes MVI & Calcium. ~  labs 1/12 showed Vit D level = 26... rec incr  OTC supplement to 2000 u daily.  ABNORMALITY OF GAIT (ICD-781.2)  ANXIETY (ICD-300.00) - she is under the care of DrPlovsky for Psychiatry now on EFFEXOR XR 75mg - 2 tabs Qam, & LORAZEPAM 0.5mg  as needed... she was prev on Wellbutrin & Celexa, but DrPlovsky makes freq changes.  ANEMIA (ICD-285.9) - after her diverticular bleed in 2006... on Fe therapy w/ resolution... ~  labs 8/08 showed Hg= 12.8 ~  labs 7/10 showed Hg= 13.2 ~  labs 1/11 showed Hg= 13.9, Fe= 66 (16%sat) ~  labs 1/12 showed Hg= 13.5  HEALTH MAINTENANCE:  She gets the annual Flu vaccines each fall;  Given TDAP 7/12...   No past surgical history on file.   Outpatient Encounter Prescriptions as of 02/18/2011  Medication Sig Dispense Refill  . atorvastatin (LIPITOR) 10 MG tablet TAKE 1 TABLET BY MOUTH  ONCE A DAY  30 tablet  9  . calcium-vitamin D (OSCAL WITH D) 500-200 MG-UNIT per tablet Take 1 tablet by mouth daily.        . cholecalciferol (VITAMIN D) 1000 UNITS tablet Take 1,000 Units by mouth daily.        . dorzolamide (TRUSOPT) 2 % ophthalmic solution Place 1 drop into both eyes 3 (three) times daily.        . felodipine (PLENDIL) 5 MG 24 hr tablet TAKE 1 TABLET EVERY DAY  30 tablet  4  . LORazepam (ATIVAN) 0.5 MG tablet TAKE 1/2 TO 1 TABLET BY MOUTH THREE TIMES DAILY AS NEEDED FOR NERVES       . losartan-hydrochlorothiazide (HYZAAR) 100-25 MG per tablet Take 1 tablet by mouth daily.        . meloxicam (MOBIC) 7.5 MG tablet Take 7.5 mg by mouth daily. AS NEEDED FOR ARTHRITIS PAIN       . metoprolol (TOPROL-XL) 50 MG 24 hr tablet TAKE 1 TABLET EVERY DAY  30 tablet  5  . Multiple Vitamins-Minerals (WOMENS MULTIVITAMIN PLUS) TABS Take 1 tablet by mouth daily.        [name] NEXIUM 40 MG capsule TAKE ONE CAPSULE TWICE A DAY  60 capsule  5  . potassium chloride SA (K-DUR,KLOR-CON) 20 MEQ tablet Take 20 mEq by mouth 2 (two) times daily.        [name] venlafaxine (EFFEXOR) 75 MG tablet Take 150 mg by mouth daily before breakfast.            Allergies  Allergen Reactions  . Amoxicillin     REACTION: itching  . Cephalexin     REACTION: itching  . Penicillins     REACTION: itching    Review of Systems         See HPI - all other systems neg except as noted...   The patient complains of dyspnea on exertion and difficulty walking.  The patient denies anorexia, fever, weight loss, weight gain, vision loss, decreased hearing, hoarseness, chest pain, syncope, peripheral edema, prolonged cough, headaches, hemoptysis, abdominal pain, melena, hematochezia, severe indigestion/heartburn, hematuria, incontinence, muscle weakness, suspicious skin lesions, transient blindness, depression, unusual weight change, abnormal bleeding, enlarged lymph nodes, and angioedema.     Objective:   Physical Exam      WD, Overweight, 77 y/o BF in NAD... wt= 177#  GENERAL:  Alert, pleasant & cooperative... I did not do formal MMSE... HEENT:  [place]/AT, EOM-wnl, PERRLA, EACs-clear, TMs-wnl, NOSE-clear, THROAT-clear & wnl, JAW- no pain or tenderness. NECK:  Supple w/ fairROM; no JVD; normal carotid impulses w/o bruits; no thyromegaly or nodules palpated (cannot feel the multinod goiter), no lymphadenopathy. CHEST:  Clear to P & A; without wheezes/ rales/ or rhonchi heard... HEART:  Regular Rhythm; without murmurs/ rubs/ or gallops detected... ABDOMEN:  Soft & nontender; normal bowel sounds; no organomegaly or masses palpated... EXT:  s/p bilat TKRs, mod arthritic changes; no varicose veins/ +venous insuffic/ tr edema. NEURO:  CN's intact; no focal neuro deficits... DERM:  No lesions noted; no rash etc...   Assessment & Plan:   Hx OSA> prev eval by DrClance, stable on CPAP...  HBP>  Controlled on Toprol, Plendil, Hyzaar; continue same...  CAD>  Followed by Walker Kehr, stable on meds, no angina...  Palpit>  She had PACs & PVCs on Holter, symptoms improved w/ BBlocker, takes extra 1/4-1/2 as needed...  CHOL>  Stable on Lip10 + diet Rx...  Multinod  Thyroid>  Clinically &  biochem euthyroid, not on meds...  GI>  eval by DrEdwards reviewed, she is improved>  HH, GERD, hx stricture, Divertics, Hems, Polyps...  DJD>  Followed by DrDean, on Mobic prn, exercises by walking w/ "Keebler"...  Anxiety>  follwed by DrPlovsky on Effexor & Lorazepam..[name]

## 2011-02-18 NOTE — Patient Instructions (Signed)
Today we updated your med list in EPIC>    Continue your current meds the same...    You may take an extra 1/2 Metoprolol tab as needed for palpitations...  Today we gave you the combination tetanus TDAP vaccination (it is good for 17yrs)...  Call for any questions...  Let's plan a follow up visit in 6 months w/ FASTING blood work & CXR at that time.Marland KitchenMarland Kitchen

## 2011-02-28 ENCOUNTER — Encounter: Payer: Self-pay | Admitting: Pulmonary Disease

## 2011-03-08 ENCOUNTER — Other Ambulatory Visit: Payer: Self-pay | Admitting: Pulmonary Disease

## 2011-05-02 LAB — CBC
HCT: 37.6
MCHC: 33
MCV: 91.5
Platelets: 203
RDW: 13.8

## 2011-05-02 LAB — HEMOGLOBIN AND HEMATOCRIT, BLOOD
HCT: 37.9
HCT: 38.5
Hemoglobin: 12.4

## 2011-05-02 LAB — TYPE AND SCREEN: Antibody Screen: NEGATIVE

## 2011-05-02 LAB — BASIC METABOLIC PANEL
BUN: 16
CO2: 30
Chloride: 105
Creatinine, Ser: 1.05
Glucose, Bld: 108 — ABNORMAL HIGH
Potassium: 3.3 — ABNORMAL LOW

## 2011-05-02 LAB — DIFFERENTIAL
Lymphs Abs: 1.4
Monocytes Relative: 11
Neutro Abs: 4.3
Neutrophils Relative %: 66

## 2011-05-02 LAB — PROTIME-INR: INR: 1.1

## 2011-05-02 LAB — APTT: aPTT: 26

## 2011-05-09 ENCOUNTER — Telehealth: Payer: Self-pay | Admitting: Pulmonary Disease

## 2011-05-09 NOTE — Telephone Encounter (Signed)
PA initiated through [employer] at [phone] for Nexium twice daily. Member ID # [id number]. Pt has history of hiatal hernia and GERD and symptoms are not controlled with once daily medication. Has tried and failed Omeprazole.  Case ID # [id number]. Nexium BID APPROVED from 04/18/2011 through 05/08/2012.

## 2011-06-05 ENCOUNTER — Other Ambulatory Visit: Payer: Self-pay | Admitting: Pulmonary Disease

## 2011-07-09 ENCOUNTER — Telehealth: Payer: Self-pay | Admitting: Pulmonary Disease

## 2011-07-09 NOTE — Telephone Encounter (Signed)
I spoke with pt and she is very concerned bc she has lost 20 lbs in 3 months. She states she has not been trying to lose weight either. Pt is requesting to be worked in with SN to "figure" this out. Pt states the next available is to far out. Please advise Dr. Kriste Basque, thanks

## 2011-07-09 NOTE — Telephone Encounter (Signed)
Per SN---ok to add pt on for Thursday at 10am.  Called and spoke with pt and she is aware of appt date and time.

## 2011-07-11 ENCOUNTER — Ambulatory Visit (INDEPENDENT_AMBULATORY_CARE_PROVIDER_SITE_OTHER): Payer: Medicare Other | Admitting: Pulmonary Disease

## 2011-07-11 ENCOUNTER — Other Ambulatory Visit (INDEPENDENT_AMBULATORY_CARE_PROVIDER_SITE_OTHER): Payer: Medicare Other

## 2011-07-11 ENCOUNTER — Encounter: Payer: Self-pay | Admitting: Pulmonary Disease

## 2011-07-11 VITALS — BP 124/82 | HR 63 | Temp 98.5°F | Ht 65.0 in | Wt 166.4 lb

## 2011-07-11 DIAGNOSIS — E559 Vitamin D deficiency, unspecified: Secondary | ICD-10-CM

## 2011-07-11 DIAGNOSIS — M545 Low back pain: Secondary | ICD-10-CM

## 2011-07-11 DIAGNOSIS — K5730 Diverticulosis of large intestine without perforation or abscess without bleeding: Secondary | ICD-10-CM

## 2011-07-11 DIAGNOSIS — R0600 Dyspnea, unspecified: Secondary | ICD-10-CM

## 2011-07-11 DIAGNOSIS — I2510 Atherosclerotic heart disease of native coronary artery without angina pectoris: Secondary | ICD-10-CM

## 2011-07-11 DIAGNOSIS — E7800 Pure hypercholesterolemia, unspecified: Secondary | ICD-10-CM

## 2011-07-11 DIAGNOSIS — M1990 Unspecified osteoarthritis, unspecified site: Secondary | ICD-10-CM

## 2011-07-11 DIAGNOSIS — R0609 Other forms of dyspnea: Secondary | ICD-10-CM

## 2011-07-11 DIAGNOSIS — K449 Diaphragmatic hernia without obstruction or gangrene: Secondary | ICD-10-CM

## 2011-07-11 DIAGNOSIS — G4733 Obstructive sleep apnea (adult) (pediatric): Secondary | ICD-10-CM

## 2011-07-11 DIAGNOSIS — R634 Abnormal weight loss: Secondary | ICD-10-CM

## 2011-07-11 DIAGNOSIS — E041 Nontoxic single thyroid nodule: Secondary | ICD-10-CM

## 2011-07-11 DIAGNOSIS — F411 Generalized anxiety disorder: Secondary | ICD-10-CM

## 2011-07-11 DIAGNOSIS — I10 Essential (primary) hypertension: Secondary | ICD-10-CM

## 2011-07-11 LAB — CBC WITH DIFFERENTIAL/PLATELET
Eosinophils Relative: 1.2 % (ref 0.0–5.0)
HCT: 43.3 % (ref 36.0–46.0)
Lymphs Abs: 1.7 10*3/uL (ref 0.7–4.0)
MCV: 90.7 fl (ref 78.0–100.0)
Monocytes Absolute: 0.5 10*3/uL (ref 0.1–1.0)
Platelets: 258 10*3/uL (ref 150.0–400.0)
RDW: 15.2 % — ABNORMAL HIGH (ref 11.5–14.6)
WBC: 6 10*3/uL (ref 4.5–10.5)

## 2011-07-11 LAB — BASIC METABOLIC PANEL
GFR: 66.77 mL/min (ref >60.00)
Potassium: 3 mEq/L — ABNORMAL LOW (ref 3.5–5.1)
Sodium: 141 mEq/L (ref 135–145)

## 2011-07-11 LAB — SEDIMENTATION RATE: Sed Rate: 13 mm/hr (ref 0–22)

## 2011-07-11 LAB — HEPATIC FUNCTION PANEL
AST: 24 U/L (ref 0–37)
Alkaline Phosphatase: 63 U/L (ref 39–117)
Total Bilirubin: 1.1 mg/dL (ref 0.3–1.2)

## 2011-07-11 LAB — TSH: TSH: 1.08 u[IU]/mL (ref 0.35–5.50)

## 2011-07-11 MED ORDER — MELOXICAM 7.5 MG PO TABS: 7.5000 mg | ORAL_TABLET | Freq: Every day | ORAL | Status: DC

## 2011-07-11 NOTE — Progress Notes (Signed)
Subjective:    Patient ID: [name], female    DOB: [birth year], 77 y.o.   MRN: [id number]  HPI 77 y/o BF here for a 6 month follow up visit...  she has multiple medical problems as noted below...  Followed for general medical purposes w/ hx of OSA, HBP, CAD, Hyperchol, Multinod thyroid, HH, Divertics & polyps, DJD, Anxiety, etc...  ~  NWG95:  her CC is "tired- no energy" & we discussed checking blood work... needs to incr exercise program/ mobility, take MVI/ VitD... she saw DrNishan 11/10 for f/u HBP, palpit, Lipids> event recorder showed PACs/ PVCs only & no med changes made... she has severe DJD & bilat TKRs> uses Mobic Prn... still under the care of DrPlovsky for psyche- & remains on Wellbutrin, Celexa, Lorazepam... ~  Jul11:  she fell recently & hurt her left hand w/ some persist tingling & she will check w/ her Ortho, DrDean... otherw stable- using CPAP & tol well;  BP controlled on meds;  no CP/ angina;  Lipids controlled on Lip10;  GI is stable;  DJD on Mobic; and nerve meds changed by DrPlovsky...   ~  August 16, 2010:  she has hx divertics/ IBS & developed some diarrhea & fecal incontinence several times per week & at night> DrStark tried to refer her to [facility] for the needed studies but she misunderstood somehow, anyway she says all symptoms better w/ regular dosing of Metamucil... she is overall improved> has a new dog= poodle mix "Keebler" & it helps her to walk... BP controlled on meds;  denies CP, palpit, SOB, etc;  f/u labs all look good;  she notes she is due for GYN check w/ DrNeal & will inquire about BMD as well...  ~  February 18, 2011:  87mo ROV & Jaina is stable overall> notes some memory problems and has had some intermittent palpitations- she's been evaluated by Walker Kehr for Cards (seen 2/12) & event monitor revealed some PACs & PVCs- asked to take an extra 1/4 of the ToprolXL as needed...  She also had GI eval by DrEdwards for mult GI symptoms- gas, fecal incont- w/  Colonoscopy 5/12 showing divertics, hems, otherw neg & bx was neg/benign no microscopic colitis;  Asked to decr milk products, given Hyoscyamine 0.125mg  SL Qid prn...  Given TDAP vaccine today.  ~  July 11, 2011:  87mo ROV & add-on for wt loss> pt called very concerned about losing "20lbs in 3 months" but on eval today it is only 11# in 87months from 177==>166# w/ her 89" height her current BMI is 27.5;  She notes good appetite, usually eats 3 meals/d, and denies any GI symptoms- w/o jaw prob, swallowing difficulty, abd pain, N/V, D/C/blood, etc; she had colonoscopy 5/12 by DrEdwards w/ divertics, hems, no polyps, no lesions; still she is quite concerned & we outlined an evaluation including labs & CT Chest/ Abd/ Pelvis to address her concerns and r/o underlying/ hidden reason for her wt loss...    BP well regulated on meds & she denies CP, palpit, dizzy, SOB, edema, etc;  Chol looks good on Lip10;  She has thyroid nodule but has been clinically & biochemically euthyroid;  She notes a little arthritis pain in her neck & wants refill Mobic;  NOTE:  LABS ret w/ K=3.0 on her Hyzaar 100-25 & K20Bid> [facility] & they note Hyzaar filled 10/31 #30 & 9/15 #30, but K20 last filled 09/01/09- we called in #60 1Bid (pt asked again to  bring all meds to every visit);  See prob list below>>          Problem List:  GLAUCOMA - on eye drops from DrShapiro...  HEARING LOSS - saw DrByers for hearing eval & they discussed hearing aides.  CHRONIC OSTEOMYELITIS OTHER SPECIFIED SITES (ICD-730.18) - she developed osteomyelitis of the right mandible related to a tooth abcess and path showed bact colonies consistant w/ Actinomyces- oral surg= DrRehm & seen by ID- DrFitzgerald and was treated w/ Clindamycin (due to Pen allergy)...  she stopped the Clindamycin on her own... last seen by DrFitzgerald 4/10 (20mo off Clinda) and doing satis- he released her... still follows up w/ DrRehm for OralSurg...  OBSTRUCTIVE  SLEEP APNEA (ICD-327.23) - mod OSA w/ sleep study 8/04 showing RDI=23, consult DrClance... Rx CPAP 10... states she is using CPAP regularly and tol well...  BRONCHITIS, RECURRENT (ICD-491.9) - no recent upper resp infection... ~  CXR 1/11 showed clear lungs, WNL...  HYPERTENSION (ICD-401.9) - controlled on TOPROL XL 50mg /d, PLENDIL 5mg /d, HYZAAR 100/25 daily, & KCl Bid...  ~  7/12: BP=136/74 and asymptomatic- denies HA, fatigue, visual changes, CP, palipit, dizziness, syncope, dyspnea, edema, etc... she has a long hx of chr noncompliance w/ med Rx... ~  12/12:  BP= 124/82 and she notes wt loss & some weakness; Labs revealed K=3.0 & pharm confirms she hasn't filled K20 since 1/11> asked to bring all meds bottles to every visit & we called in K20 1Bid #60...  CAD (ICD-414.00) - non-obstructive disease w/ cath 1/06 showing 30% focal stenosis in CIRC... NuclearStressTest 9/07 showed apical thinning, no ischemia, EF=69%... followed by Walker Kehr for her HBP, palpit, lipids> Event recoder showed benign PVCs & PACs, no change in med Rx... she can't take ASA, she says.  HYPERCHOLESTEROLEMIA (ICD-272.0) - on LIPITOR 10mg /d...  ~  FLP 8/08 w/ TChol 174, TG 76, HDL 65, LDL 94 ~  FLP 7/09 showed TChol 173, TG 76, HDL 57, LDL 101 ~  FLP 7/10 showed TChol 174, TG 94, HDL 57, LDL 99 ~  FLP 7/11 showed TChol 166, TG 90, HDL 58, LDL 91 ~  FLP 1/12 showed TChol 158, TG 91, HDL 53, LDL 86  Hx of THYROID NODULE (ICD-241.0) - she has multinodular thyroid, no palp nodules, clinically euthyroid & doing well...  ~  labs 8/08 showed TSH = 0.87 ~  labs 7/09 showed TSH = 0.70 ~  labs 7/10 showed TSH= 1.12 ~  labs 1/11 showed TSH= 1.06 ~  labs 1/12 showed TSH= 1.53 ~  Labs 12/12 showed TSH= 1.08, FreeT3= 2.5 (2.3-4.2). FreeT4= 0.66 (0.60-1.60)  HIATAL HERNIA (ICD-553.3) - on NEXIUM 40mg /d... HH on prev CTscans... EGD in 1998 w/ distal esoph stricture dilated... no recurrent dysphagia etc... ~  last EGD 7/09 by  DrStark showed 4cmHH, GERD, gastritis & gastic polyps- neg HPylori... Rx w/ NEXIUM 40mg Bid ==> then weaned to one per day.  DIVERTICULOSIS OF COLON (ICD-562.10), & COLONIC POLYPS, ADENOMATOUS, HX OF (ICD-V12.72) - colonoscopy 5/05 by DrStark w/ several sm 3-4 mm polyps, divertics, & f/u planned 62yrs... hosp in 2006 w/ divertic bleed that resolved spont... repeat hosp and f/u colonoscopy 7/09 showed divertics, and several 4-79mm adenomatous polyps... ~  11/11:  eval by DrStark for diarrhea & fecal incontinence> he rec refer to [facility], she declined & says she improved w/ METAMUCIL daily. ~  5/12:  She sought second opinion GI from DrEdwards> colonoscopy w/ divertics, hems, no colitis; Asked to decr milk products, given Hyoscyamine 0.125mg   SL Qid prn...  DEGENERATIVE JOINT DISEASE (ICD-715.90) - followed by DrDean... S/P Left TKR in 12/04 by DrDean & Right TKR 7/02 by DrKrege... known CSpine and L/S spine disease... s/p recent epid steroid shot in lower back w/ improvement... we refilled her MOBIC 7.5mg  for as needed use. ~  8/11:  she fell & saw DrDean & DrNewton for pain & paresthesias in left arm & improved w/ CSpine injection.  BACK PAIN, LUMBAR (ICD-724.2)  VITAMIN D DEFICIENCY (ICD-268.9) - Vit D level Jul09 = 19 & started on Vit D 50K weekly... ~  labs 7/10 showed Vit D level = 33... OK to switch to OTC Vit D 1000 u daily, also takes MVI & Calcium. ~  labs 1/12 showed Vit D level = 26... rec incr OTC supplement to 2000 u daily. ~  Labs 12/12 showed Vit D level = 35... rec to continue 2000u daily.  ABNORMALITY OF GAIT (ICD-781.2)  ANXIETY (ICD-300.00) - she is under the care of DrPlovsky for Psychiatry now on EFFEXOR XR 75mg - 2 tabs Qam, & LORAZEPAM 0.5mg  as needed... she was prev on Wellbutrin & Celexa, but DrPlovsky makes freq changes.  ANEMIA (ICD-285.9) - after her diverticular bleed in 2006... on Fe therapy w/ resolution... ~  labs 8/08 showed Hg= 12.8 ~  labs 7/10 showed Hg= 13.2 ~   labs 1/11 showed Hg= 13.9, Fe= 66 (16%sat) ~  labs 1/12 showed Hg= 13.5 ~  Labs 12/12 showed Hg= 14.0  HEALTH MAINTENANCE:  She gets the annual Flu vaccines each fall;  Given TDAP 7/12...   No past surgical history on file.       SHE DID NOT BRING MED BOTTLES TO THE VISIT FOR INSPECTION: Outpatient Encounter Prescriptions as of 07/11/2011  Medication Sig Dispense Refill  . atorvastatin (LIPITOR) 10 MG tablet TAKE 1 TABLET BY MOUTH ONCE A DAY  30 tablet  9  . calcium-vitamin D (OSCAL WITH D) 500-200 MG-UNIT per tablet Take 1 tablet by mouth daily.        . cholecalciferol (VITAMIN D) 1000 UNITS tablet Take 1,000 Units by mouth daily.        . dorzolamide (TRUSOPT) 2 % ophthalmic solution Place 1 drop into both eyes 3 (three) times daily.        [name] esomeprazole (NEXIUM) 40 MG capsule Take 40 mg by mouth daily before breakfast.       . felodipine (PLENDIL) 5 MG 24 hr tablet TAKE 1 TABLET EVERY DAY  30 tablet  4  . LORazepam (ATIVAN) 0.5 MG tablet TAKE 1/2 TO 1 TABLET BY MOUTH THREE TIMES DAILY AS NEEDED FOR NERVES       . losartan-hydrochlorothiazide (HYZAAR) 100-25 MG per tablet TAKE 1 TABLET EVERY DAY  30 tablet  5  . meloxicam (MOBIC) 7.5 MG tablet Take 7.5 mg by mouth daily. AS NEEDED FOR ARTHRITIS PAIN       . metoprolol (TOPROL-XL) 50 MG 24 hr tablet TAKE 1 TABLET EVERY DAY  30 tablet  5  . Multiple Vitamins-Minerals (WOMENS MULTIVITAMIN PLUS) TABS Take 1 tablet by mouth daily.        . potassium chloride SA (K-DUR,KLOR-CON) 20 MEQ tablet Take 20 mEq by mouth 2 (two) times daily.    Pharm confirms last refilled 09/01/09    . venlafaxine (EFFEXOR) 75 MG tablet Take 150 mg by mouth daily before breakfast.          Allergies  Allergen Reactions  . Amoxicillin     REACTION:  itching  . Cephalexin     REACTION: itching  . Penicillins     REACTION: itching    Current Medications, Allergies, Past Medical History, Past Surgical History, Family History, and Social History were reviewed in  [facility] record.     Review of Systems         See HPI - all other systems neg except as noted...   The patient complains of dyspnea on exertion and difficulty walking.  The patient denies anorexia, fever, weight loss, weight gain, vision loss, decreased hearing, hoarseness, chest pain, syncope, peripheral edema, prolonged cough, headaches, hemoptysis, abdominal pain, melena, hematochezia, severe indigestion/heartburn, hematuria, incontinence, muscle weakness, suspicious skin lesions, transient blindness, depression, unusual weight change, abnormal bleeding, enlarged lymph nodes, and angioedema.     Objective:   Physical Exam      WD, Overweight, 77 y/o BF in NAD... wt= 177#  GENERAL:  Alert, pleasant & cooperative... I did not do formal MMSE... HEENT:  [place]/AT, EOM-wnl, PERRLA, EACs-clear, TMs-wnl, NOSE-clear, THROAT-clear & wnl, JAW- no pain or tenderness. NECK:  Supple w/ fairROM; no JVD; normal carotid impulses w/o bruits; no thyromegaly or nodules palpated (cannot feel the multinod goiter), no lymphadenopathy. CHEST:  Clear to P & A; without wheezes/ rales/ or rhonchi heard... HEART:  Regular Rhythm; without murmurs/ rubs/ or gallops detected... ABDOMEN:  Soft & nontender; normal bowel sounds; no organomegaly or masses palpated... EXT:  s/p bilat TKRs, mod arthritic changes; no varicose veins/ +venous insuffic/ tr edema. NEURO:  CN's intact; no focal neuro deficits... DERM:  No lesions noted; no rash etc...  RADIOLOGY DATA:  Reviewed in the EPIC EMR & discussed w/ the patient...  LABORATORY DATA:  Reviewed in the EPIC EMR & discussed w/ the patient...   Assessment & Plan:   Weight Loss>  We will eval further w/ LABS & CT Chest/ Abd/ Pelvis==> pending.  Hx OSA> prev eval by DrClance, stable on CPAP...  HBP>  Controlled on Toprol, Plendil, Hyzaar; continue same... NOTE: K=3.0 & Pharm confirms not taking her K20 w/ last refill 09/01/09; we called in K20 Bid  #60 & pt reminded to bring all med bottles to every visit...  CAD>  Followed by Walker Kehr, stable on meds, no angina...  Palpit>  She had PACs & PVCs on Holter, symptoms improved w/ BBlocker, takes extra 1/4-1/2 as needed...  CHOL>  Stable on Lip10 + diet Rx...  Multinod Thyroid>  Clinically & biochem euthyroid, not on meds...  GI>  eval by DrEdwards reviewed, she is improved>  HH, GERD, hx stricture, Divertics, Hems, Polyps...  DJD>  Followed by DrDean, on Mobic prn, exercises by walking w/ "Keebler"...  Anxiety>  follwed by DrPlovsky on Effexor & Lorazepam..[name]

## 2011-07-11 NOTE — Patient Instructions (Signed)
Today we updated your med list in our EPIC system...    Continue your current medications the same...    We refilled your Meloxicam per request...  Today we did your follow up blood work...    Please call the [facility] in a few days for your results...    Dial [id number] & when prompted enter your patient number followed by the # symbol...    Your patient number is:  [id number]#  We will arrange for a CT Scan of your chest and abdomen> we will call you w/ the results when avail...  Add a can of ENSURE or BOOST supplements betw breakfast & lunch, and betw lunch & dinner...  Call for any questions...  Let's plan a follow up OV in 3-4 months.Marland KitchenMarland Kitchen

## 2011-07-16 ENCOUNTER — Ambulatory Visit (INDEPENDENT_AMBULATORY_CARE_PROVIDER_SITE_OTHER)
Admission: RE | Admit: 2011-07-16 | Discharge: 2011-07-16 | Disposition: A | Discharge status: Home / Self Care | Payer: Medicare Other | Source: Ambulatory Visit | Attending: Pulmonary Disease | Admitting: Pulmonary Disease

## 2011-07-16 DIAGNOSIS — R0989 Other specified symptoms and signs involving the circulatory and respiratory systems: Secondary | ICD-10-CM

## 2011-07-16 DIAGNOSIS — R0600 Dyspnea, unspecified: Secondary | ICD-10-CM

## 2011-07-16 DIAGNOSIS — R634 Abnormal weight loss: Secondary | ICD-10-CM

## 2011-07-16 MED ORDER — IOHEXOL 300 MG/ML  SOLN
100.0000 mL | Freq: Once | INTRAMUSCULAR | Status: AC | PRN
  Administered 2011-07-16: 100 mL via INTRAVENOUS

## 2011-08-19 ENCOUNTER — Ambulatory Visit: Payer: Medicare Other | Admitting: Pulmonary Disease

## 2011-09-16 ENCOUNTER — Other Ambulatory Visit: Payer: Self-pay | Admitting: Pulmonary Disease

## 2011-10-15 ENCOUNTER — Other Ambulatory Visit: Payer: Self-pay | Admitting: Pulmonary Disease

## 2011-10-30 ENCOUNTER — Other Ambulatory Visit: Payer: Self-pay | Admitting: *Deleted

## 2011-10-30 MED ORDER — POTASSIUM CHLORIDE CRYS ER 20 MEQ PO TBCR: 20.0000 meq | EXTENDED_RELEASE_TABLET | Freq: Two times a day (BID) | ORAL | Status: DC

## 2011-10-30 MED ORDER — MELOXICAM 7.5 MG PO TABS: 7.5000 mg | ORAL_TABLET | Freq: Every day | ORAL | Status: DC

## 2011-10-30 MED ORDER — FELODIPINE ER 5 MG PO TB24: 5.0000 mg | ORAL_TABLET | Freq: Every day | ORAL | Status: DC

## 2011-11-11 ENCOUNTER — Other Ambulatory Visit: Payer: Self-pay | Admitting: Pulmonary Disease

## 2011-11-11 ENCOUNTER — Other Ambulatory Visit (INDEPENDENT_AMBULATORY_CARE_PROVIDER_SITE_OTHER): Payer: Medicare Other

## 2011-11-11 ENCOUNTER — Ambulatory Visit (INDEPENDENT_AMBULATORY_CARE_PROVIDER_SITE_OTHER): Payer: Medicare Other | Admitting: Pulmonary Disease

## 2011-11-11 ENCOUNTER — Encounter: Payer: Self-pay | Admitting: Pulmonary Disease

## 2011-11-11 VITALS — BP 132/88 | HR 63 | Temp 98.0°F | Ht 65.0 in | Wt 163.0 lb

## 2011-11-11 DIAGNOSIS — F411 Generalized anxiety disorder: Secondary | ICD-10-CM

## 2011-11-11 DIAGNOSIS — E7800 Pure hypercholesterolemia, unspecified: Secondary | ICD-10-CM

## 2011-11-11 DIAGNOSIS — I2510 Atherosclerotic heart disease of native coronary artery without angina pectoris: Secondary | ICD-10-CM

## 2011-11-11 DIAGNOSIS — K5730 Diverticulosis of large intestine without perforation or abscess without bleeding: Secondary | ICD-10-CM

## 2011-11-11 DIAGNOSIS — E041 Nontoxic single thyroid nodule: Secondary | ICD-10-CM

## 2011-11-11 DIAGNOSIS — R269 Unspecified abnormalities of gait and mobility: Secondary | ICD-10-CM

## 2011-11-11 DIAGNOSIS — I10 Essential (primary) hypertension: Secondary | ICD-10-CM

## 2011-11-11 DIAGNOSIS — M1990 Unspecified osteoarthritis, unspecified site: Secondary | ICD-10-CM

## 2011-11-11 DIAGNOSIS — K219 Gastro-esophageal reflux disease without esophagitis: Secondary | ICD-10-CM

## 2011-11-11 DIAGNOSIS — G4733 Obstructive sleep apnea (adult) (pediatric): Secondary | ICD-10-CM

## 2011-11-11 DIAGNOSIS — K449 Diaphragmatic hernia without obstruction or gangrene: Secondary | ICD-10-CM

## 2011-11-11 DIAGNOSIS — R634 Abnormal weight loss: Secondary | ICD-10-CM

## 2011-11-11 LAB — LIPID PANEL: HDL: 62.4 mg/dL (ref >39.00)

## 2011-11-11 LAB — BASIC METABOLIC PANEL
BUN: 17 mg/dL (ref 6–23)
CO2: 30 mEq/L (ref 19–32)
Chloride: 104 mEq/L (ref 96–112)
GFR: 79.11 mL/min (ref >60.00)
Glucose, Bld: 102 mg/dL — ABNORMAL HIGH (ref 70–99)
Potassium: 3.8 mEq/L (ref 3.5–5.1)
Sodium: 144 mEq/L (ref 135–145)

## 2011-11-11 NOTE — Progress Notes (Signed)
Subjective:    Patient ID: [name], female    DOB: [birth year], 78 y.o.   MRN: [id number]  HPI 78 y/o BF here for a 6 month follow up visit...  she has multiple medical problems as noted below...  Followed for general medical purposes w/ hx of OSA, HBP, CAD, Hyperchol, Multinod thyroid, HH, Divertics & polyps, DJD, Anxiety, etc...  ~  [id number]:  her CC is "tired- no energy" & we discussed checking blood work... needs to incr exercise program/ mobility, take MVI/ VitD... she saw DrNishan 11/10 for f/u HBP, palpit, Lipids> event recorder showed PACs/ PVCs only & no med changes made... she has severe DJD & bilat TKRs> uses Mobic Prn... still under the care of DrPlovsky for psyche- & remains on Wellbutrin, Celexa, Lorazepam... ~  Jul11:  she fell recently & hurt her left hand w/ some persist tingling & she will check w/ her Ortho, DrDean... otherw stable- using CPAP & tol well;  BP controlled on meds;  no CP/ angina;  Lipids controlled on Lip10;  GI is stable;  DJD on Mobic; and nerve meds changed by DrPlovsky...   ~  August 16, 2010:  she has hx divertics/ IBS & developed some diarrhea & fecal incontinence several times per week & at night> DrStark tried to refer her to [facility] for the needed studies but she misunderstood somehow, anyway she says all symptoms better w/ regular dosing of Metamucil... she is overall improved> has a new dog= poodle mix "Keebler" & it helps her to walk... BP controlled on meds;  denies CP, palpit, SOB, etc;  f/u labs all look good;  she notes she is due for GYN check w/ DrNeal & will inquire about BMD as well...  ~  February 18, 2011:  48mo ROV & [name] is stable overall> notes some memory problems and has had some intermittent palpitations- she's been evaluated by Walker Kehr for Cards (seen 2/12) & event monitor revealed some PACs & PVCs- asked to take an extra 1/4 of the ToprolXL as needed...  She also had GI eval by DrEdwards for mult GI symptoms- gas, fecal incont- w/  Colonoscopy 5/12 showing divertics, hems, otherw neg & bx was neg/benign no microscopic colitis;  Asked to decr milk products, given Hyoscyamine 0.125mg  SL Qid prn...  Given TDAP vaccine today.  ~  July 11, 2011:  56mo ROV & add-on for wt loss> pt called very concerned about losing "20lbs in 3 months" but on eval today it is only 11# in 56months from 177==>166# w/ her 82" height her current BMI is 27.5;  She notes good appetite, usually eats 3 meals/d, and denies any GI symptoms- w/o jaw prob, swallowing difficulty, abd pain, N/V, D/C/blood, etc; she had colonoscopy 5/12 by DrEdwards w/ divertics, hems, no polyps, no lesions; still she is quite concerned & we outlined an evaluation including labs & CT Chest/ Abd/ Pelvis to address her concerns and r/o underlying/ hidden reason for her wt loss...    BP well regulated on meds & she denies CP, palpit, dizzy, SOB, edema, etc;  Chol looks good on Lip10;  She has thyroid nodule but has been clinically & biochemically euthyroid;  She notes a little arthritis pain in her neck & wants refill Mobic;  NOTE:  LABS ret w/ K=3.0 on her Hyzaar 100-25 & K20Bid> [facility] & they note Hyzaar filled 10/31 #30 & 9/15 #30, but K20 last filled 09/01/09- we called in #60 1Bid (pt asked again to  bring all meds to every visit);  See prob list below>>  ~  November 11, 2011:  639mo ROV & [name] continues to complain of weight loss (wt=163# down 3# further in the last 639mo) despite drinking Boost Bid she says; reminded to eat 3 meals regularly & use the supplement betw meals as a "medication";  She intermittently & irregularly c/o abd discomfort- sometimes when she eats, sometimes in LLQ area; Denies n/v, dysphagia, reflux symptoms, change in bowels or blood seen; she sees DrJEdwards for GI> as noted above- she had colonoscopy 5/12 by DrEdwards w/ divertics, hems, no polyps, no lesions; and we did CT Chest/ Abd/ Pelvis 12/12 w/ cardiomeg, divertics,but otherw neg & no lesions  seen... ?she wants further eval & we discussed referral back to GI for their review & recommendations>> (Appt sched for 4/11 at 10:30AM)...    During last OV her potassium was low at 3.0 & call to Pharm indicated she wasn't filling her KCl supplement (on Hyzaar for BP- see below); we called in K20Bid & she tells me she is taking it properly ever since> K=3.8 today, continue same...    LABS 4/13:  FLP on Lip10 showed TChol 223, TG 90, HDL 62, LDL 140 (I called Pharm- hasn't refilled in 5 months!);  Chems- wnl w/ K=3.8 on K20Bid...          Problem List:     PROBLEM LIST BELOW UPDATED 11/11/11 >>  GLAUCOMA - on eye drops from DrShapiro...  HEARING LOSS - saw DrByers for hearing eval & they discussed hearing aides.  CHRONIC OSTEOMYELITIS OTHER SPECIFIED SITES (ICD-730.18) - she developed osteomyelitis of the right mandible 2009 related to a tooth abcess and path showed bact colonies consistant w/ Actinomyces- oral surg= DrRehm & seen by ID- DrFitzgerald and was treated w/ Clindamycin (due to Pen allergy)...  she stopped the Clindamycin on her own... last seen by DrFitzgerald 4/10 (39mo off Clinda) and doing satis- he released her... still follows up w/ DrRehm for OralSurg...  OBSTRUCTIVE SLEEP APNEA (ICD-327.23) - mod OSA w/ sleep study 8/04 showing RDI=23, consult DrClance... Rx CPAP 10... states she is using CPAP regularly and tol well...  BRONCHITIS, RECURRENT (ICD-491.9) - no recent upper resp infection... ~  CXR 1/11 showed clear lungs, WNL.[name]. ~  CT Chest (along w/ Abd/Pelvis) 12/12 due to c/o wt loss showed cardiomegaly, clear lungs, NAD...  HYPERTENSION (ICD-401.9) - controlled on TOPROL XL 50mg /d, PLENDIL 5mg /d, HYZAAR 100/25 daily, & KCl Bid...  ~  7/12: BP=136/74 and asymptomatic- denies HA, fatigue, visual changes, CP, palipit, dizziness, syncope, dyspnea, edema, etc... she has a long hx of chr noncompliance w/ med Rx... ~  12/12:  BP= 124/82 and she notes wt loss & some weakness;  Labs revealed K=3.0 & pharm confirms she hasn't filled K20 since 1/11> asked to bring all meds bottles to every visit & we called in K20 1Bid #60... ~  4/13:  BP= 132/88 & she confirms taking meds properly + the K20Bid; LABS showed K=3.8, Renal= wnl, Creat=0.9; rec to continue same meds...  CAD (ICD-414.00) - non-obstructive disease w/ cath 1/06 showing 30% focal stenosis in CIRC... NuclearStressTest 9/07 showed apical thinning, no ischemia, EF=69%... followed by Walker Kehr for her HBP, palpit, lipids> Event recoder showed benign PVCs & PACs, no change in med Rx... she can't take ASA, she says.  HYPERCHOLESTEROLEMIA (ICD-272.0) - on LIPITOR 10mg /d...  ~  FLP 8/08 w/ TChol 174, TG 76, HDL 65, LDL 94 ~  FLP 7/09 showed  TChol 173, TG 76, HDL 57, LDL 101 ~  FLP 7/10 showed TChol 174, TG 94, HDL 57, LDL 99 ~  FLP 7/11 showed TChol 166, TG 90, HDL 58, LDL 91 ~  FLP 1/12 on Lip10 showed TChol 158, TG 91, HDL 53, LDL 86 ~  FLP 4/13 on Lip10 showed TChol 223, TG 90, HDL 62, LDL 140; what happened? Call to pharm revealed that she hasn't refilled Lip10 in 5months! Rec to take daily...  Hx of THYROID NODULE (ICD-241.0) - she has multinodular thyroid, no palp nodules, clinically euthyroid & doing well...  ~  labs 8/08 showed TSH = 0.87 ~  labs 7/09 showed TSH = 0.70 ~  labs 7/10 showed TSH= 1.12 ~  labs 1/11 showed TSH= 1.06 ~  labs 1/12 showed TSH= 1.53 ~  Labs 12/12 showed TSH= 1.08, FreeT3= 2.5 (2.3-4.2). FreeT4= 0.66 (0.60-1.60)  HIATAL HERNIA (ICD-553.3) - on NEXIUM 40mg /d... HH on prev CTscans... EGD in 1998 w/ distal esoph stricture dilated... no recurrent dysphagia etc... ~  last EGD 7/09 by DrStark showed 4cmHH, GERD, gastritis & gastic polyps- neg HPylori... Rx w/ NEXIUM 40mg Bid ==> then weaned to one per day.  DIVERTICULOSIS OF COLON (ICD-562.10), & COLONIC POLYPS, ADENOMATOUS, HX OF (ICD-V12.72) - colonoscopy 5/05 by DrStark w/ several sm 3-4 mm polyps, divertics, & f/u planned 64yrs... hosp in  2006 w/ divertic bleed that resolved spont... repeat hosp and f/u colonoscopy 7/09 showed divertics, and several 4-49mm adenomatous polyps... ~  11/11:  eval by DrStark for diarrhea & fecal incontinence> he rec refer to [facility], she declined & says she improved w/ METAMUCIL daily. ~  5/12:  She sought second opinion GI from DrEdwards> colonoscopy w/ divertics, hems, no colitis; Asked to decr milk products, given Hyoscyamine 0.125mg  SL Qid prn...  Pt concern for weight loss >> despite good appetite, eating well she says, taking Boost supplements Bid... ~  Prev GI evals from DrStark, then DrJEdwards as above... ~  7/12:  Weight = 177# ~  12/12:  Weight = 166# ~  CT Chest/Abd/Pelvis 12/12 showed cardiomeg, clear lungs & no lesions in the chest; Divertics & otherw no lesions in the Abd... ~  4/13:  Weight = 163#; Pt still c/o wt loss & we will refer back to GI for further assessment & on-going management...  DEGENERATIVE JOINT DISEASE (ICD-715.90) - followed by DrDean... S/P Left TKR in 12/04 by DrDean & Right TKR 7/02 by DrKrege... known CSpine and L/S spine disease... s/p recent epid steroid shot in lower back w/ improvement... we refilled her MOBIC 7.5mg  for as needed use. ~  8/11:  she fell & saw DrDean & DrNewton for pain & paresthesias in left arm & improved w/ CSpine injection.  BACK PAIN, LUMBAR (ICD-724.2)  VITAMIN D DEFICIENCY (ICD-268.9) - Vit D level Jul09 = 19 & started on Vit D 50K weekly... ~  labs 7/10 showed Vit D level = 33... OK to switch to OTC Vit D 1000 u daily, also takes MVI & Calcium. ~  labs 1/12 showed Vit D level = 26... rec incr OTC supplement to 2000 u daily. ~  Labs 12/12 showed Vit D level = 35... rec to continue 2000u daily.  ABNORMALITY OF GAIT (ICD-781.2)  ANXIETY (ICD-300.00) - she is under the care of DrPlovsky for Psychiatry now on EFFEXOR XR 75mg - 2 tabs Qam, & LORAZEPAM 0.5mg  as needed... she was prev on Wellbutrin & Celexa, but DrPlovsky makes freq  changes.  ANEMIA (ICD-285.9) - after her  diverticular bleed in 2006... on Fe therapy w/ resolution... ~  labs 8/08 showed Hg= 12.8 ~  labs 7/10 showed Hg= 13.2 ~  labs 1/11 showed Hg= 13.9, Fe= 66 (16%sat) ~  labs 1/12 showed Hg= 13.5 ~  Labs 12/12 showed Hg= 14.0  HEALTH MAINTENANCE:  She gets the annual Flu vaccines each fall;  Given TDAP 7/12...   Past Surgical History  Procedure Date  . Appendectomy   . Cholecystectomy   . Abdominal hysterectomy   . Total knee replacement left   . Total knee replacement right   . Cataract extraction     Outpatient Encounter Prescriptions as of 11/11/2011  Medication Sig Dispense Refill  . atorvastatin (LIPITOR) 10 MG tablet TAKE 1 TABLET BY MOUTH ONCE A DAY  30 tablet  9  . calcium-vitamin D (OSCAL WITH D) 500-200 MG-UNIT per tablet Take 1 tablet by mouth daily.        . cholecalciferol (VITAMIN D) 1000 UNITS tablet Take 1,000 Units by mouth daily.        . dorzolamide (TRUSOPT) 2 % ophthalmic solution Place 1 drop into both eyes 3 (three) times daily.        [name] esomeprazole (NEXIUM) 40 MG capsule Take 40 mg by mouth daily before breakfast.       . felodipine (PLENDIL) 5 MG 24 hr tablet Take 1 tablet (5 mg total) by mouth daily.  30 tablet  11  . felodipine (PLENDIL) 5 MG 24 hr tablet TAKE 1 TABLET EVERY DAY  30 tablet  4  . LORazepam (ATIVAN) 0.5 MG tablet TAKE 1/2 TO 1 TABLET BY MOUTH THREE TIMES DAILY AS NEEDED FOR NERVES       . losartan-hydrochlorothiazide (HYZAAR) 100-25 MG per tablet TAKE 1 TABLET EVERY DAY  30 tablet  5  . meloxicam (MOBIC) 7.5 MG tablet Take 1 tablet (7.5 mg total) by mouth daily. AS NEEDED FOR ARTHRITIS PAIN  30 tablet  11  . metoprolol succinate (TOPROL-XL) 50 MG 24 hr tablet TAKE 1 TABLET EVERY DAY  30 tablet  5  . Multiple Vitamins-Minerals (WOMENS MULTIVITAMIN PLUS) TABS Take 1 tablet by mouth daily.        . potassium chloride SA (K-DUR,KLOR-CON) 20 MEQ tablet Take 1 tablet (20 mEq total) by mouth 2 (two) times  daily.  60 tablet  11  . venlafaxine (EFFEXOR) 75 MG tablet Take 150 mg by mouth daily before breakfast.          Allergies  Allergen Reactions  . Amoxicillin     REACTION: itching  . Cephalexin     REACTION: itching  . Penicillins     REACTION: itching    Current Medications, Allergies, Past Medical History, Past Surgical History, Family History, and Social History were reviewed in [employer] record.     Review of Systems         See HPI - all other systems neg except as noted...   The patient complains of dyspnea on exertion and difficulty walking.  The patient denies anorexia, fever, weight loss, weight gain, vision loss, decreased hearing, hoarseness, chest pain, syncope, peripheral edema, prolonged cough, headaches, hemoptysis, abdominal pain, melena, hematochezia, severe indigestion/heartburn, hematuria, incontinence, muscle weakness, suspicious skin lesions, transient blindness, depression, unusual weight change, abnormal bleeding, enlarged lymph nodes, and angioedema.     Objective:   Physical Exam      WD, Overweight, 78 y/o BF in NAD... wt= 177#  GENERAL:  Alert, pleasant & cooperative.Marland KitchenMarland Kitchen  I did not do formal MMSE... HEENT:  [place]/AT, EOM-wnl, PERRLA, EACs-clear, TMs-wnl, NOSE-clear, THROAT-clear & wnl, JAW- no pain or tenderness. NECK:  Supple w/ fairROM; no JVD; normal carotid impulses w/o bruits; no thyromegaly or nodules palpated (cannot feel the multinod goiter), no lymphadenopathy. CHEST:  Clear to P & A; without wheezes/ rales/ or rhonchi heard... HEART:  Regular Rhythm; without murmurs/ rubs/ or gallops detected... ABDOMEN:  Soft & nontender; normal bowel sounds; no organomegaly or masses palpated... EXT:  s/p bilat TKRs, mod arthritic changes; no varicose veins/ +venous insuffic/ tr edema. NEURO:  CN's intact; no focal neuro deficits... DERM:  No lesions noted; no rash etc...  RADIOLOGY DATA:  Reviewed in the EPIC EMR & discussed w/ the  patient...  LABORATORY DATA:  Reviewed in the EPIC EMR & discussed w/ the patient...   Assessment & Plan:   Weight Loss>  No obvious cause on our eval as above; she is concerned & wants further eval==> refer back to GI, DrJEdwards...  Hx OSA> prev eval by DrClance, stable on CPAP...  HBP>  Controlled on Toprol, Plendil, Hyzaar; continue same... NOTE: K was 3.0 12/12 & Pharm confirmed not refilled for 10 months; now on K20Bid & repeat K=3.8, continue same...  CAD>  Followed by Walker Kehr, stable on meds, no angina...  Palpit>  She had PACs & PVCs on Holter, symptoms improved w/ BBlocker, takes extra 1/4-1/2 as needed...  CHOL>  She was stable on Lip10 + diet Rx; Pharm confirms Lipitor not refilled in 28mo & FLP not at goal; asked to restart Lip10 & stay on it...  Multinod Thyroid>  Clinically & biochem euthyroid, not on meds...  GI>  eval by DrEdwards reviewed> HH, GERD, hx stricture, Divertics, Hems, Polyps; she has some wt loss & vague intermittent symptoms w/ some epig discomfort so we will have her incr the Nexium to Bid for now...  DJD>  Followed by DrDean, on Mobic prn, exercises by walking w/ "Keebler"...  Anxiety>  followed by DrPlovsky on Effexor & Lorazepam...   Patient's Medications  New Prescriptions   No medications on file  Previous Medications   ATORVASTATIN (LIPITOR) 10 MG TABLET    TAKE 1 TABLET BY MOUTH ONCE A DAY   CALCIUM-VITAMIN D (OSCAL WITH D) 500-200 MG-UNIT PER TABLET    Take 1 tablet by mouth daily.     CHOLECALCIFEROL (VITAMIN D) 1000 UNITS TABLET    Take 1,000 Units by mouth daily.     DORZOLAMIDE (TRUSOPT) 2 % OPHTHALMIC SOLUTION    Place 1 drop into both eyes 3 (three) times daily.     ESOMEPRAZOLE (NEXIUM) 40 MG CAPSULE    Take 40 mg by mouth daily before breakfast.    FELODIPINE (PLENDIL) 5 MG 24 HR TABLET    Take 1 tablet (5 mg total) by mouth daily.   FELODIPINE (PLENDIL) 5 MG 24 HR TABLET    TAKE 1 TABLET EVERY DAY   LORAZEPAM (ATIVAN) 0.5 MG  TABLET    TAKE 1/2 TO 1 TABLET BY MOUTH THREE TIMES DAILY AS NEEDED FOR NERVES    LOSARTAN-HYDROCHLOROTHIAZIDE (HYZAAR) 100-25 MG PER TABLET    TAKE 1 TABLET EVERY DAY   MELOXICAM (MOBIC) 7.5 MG TABLET    Take 1 tablet (7.5 mg total) by mouth daily. AS NEEDED FOR ARTHRITIS PAIN   METOPROLOL SUCCINATE (TOPROL-XL) 50 MG 24 HR TABLET    TAKE 1 TABLET EVERY DAY   MULTIPLE VITAMINS-MINERALS (WOMENS MULTIVITAMIN PLUS) TABS    Take 1  tablet by mouth daily.     POTASSIUM CHLORIDE SA (K-DUR,KLOR-CON) 20 MEQ TABLET    Take 1 tablet (20 mEq total) by mouth 2 (two) times daily.   VENLAFAXINE (EFFEXOR) 75 MG TABLET    Take 150 mg by mouth daily before breakfast.    Modified Medications   Modified Medication Previous Medication   ESOMEPRAZOLE (NEXIUM) 40 MG CAPSULE NEXIUM 40 MG capsule      Take 1 capsule (40 mg total) by mouth daily.    TAKE ONE CAPSULE TWICE A DAY  Discontinued Medications   No medications on file

## 2011-11-11 NOTE — Patient Instructions (Signed)
Today we updated your med list in our EPIC system...    Continue your current medications the same...  Today we did your follow up "targeted" labs including a potassium level & Lipid profile...    We will call you w/ the results when avail...  We will arrange for a follow up eval by your gastroenterologist DrEdwards...  Call if I can be of service in any way...  Let's plan a follow up visit in 6 months.Marland KitchenMarland Kitchen

## 2011-11-13 ENCOUNTER — Other Ambulatory Visit: Payer: Self-pay | Admitting: Pulmonary Disease

## 2011-11-13 NOTE — Telephone Encounter (Signed)
Per last OV note with Dr. Kriste Basque, pt is now on Nexium once daily

## 2011-11-14 ENCOUNTER — Telehealth: Payer: Self-pay | Admitting: Pulmonary Disease

## 2011-11-14 NOTE — Telephone Encounter (Signed)
Called and spoke with pt about her lab results per SN.  See result note for the pt.

## 2011-12-04 ENCOUNTER — Other Ambulatory Visit: Payer: Self-pay | Admitting: Gastroenterology

## 2012-02-19 ENCOUNTER — Other Ambulatory Visit (HOSPITAL_COMMUNITY): Payer: Self-pay | Admitting: Obstetrics & Gynecology

## 2012-02-19 DIAGNOSIS — R109 Unspecified abdominal pain: Secondary | ICD-10-CM

## 2012-02-19 DIAGNOSIS — R1900 Intra-abdominal and pelvic swelling, mass and lump, unspecified site: Secondary | ICD-10-CM

## 2012-02-20 ENCOUNTER — Ambulatory Visit (HOSPITAL_COMMUNITY)
Admission: RE | Admit: 2012-02-20 | Discharge: 2012-02-20 | Disposition: A | Discharge status: Home / Self Care | Payer: Medicare Other | Source: Ambulatory Visit | Attending: Obstetrics & Gynecology | Admitting: Obstetrics & Gynecology

## 2012-02-20 DIAGNOSIS — R1013 Epigastric pain: Secondary | ICD-10-CM | POA: Insufficient documentation

## 2012-02-20 DIAGNOSIS — I517 Cardiomegaly: Secondary | ICD-10-CM | POA: Insufficient documentation

## 2012-02-20 DIAGNOSIS — N323 Diverticulum of bladder: Secondary | ICD-10-CM | POA: Insufficient documentation

## 2012-02-20 DIAGNOSIS — K5730 Diverticulosis of large intestine without perforation or abscess without bleeding: Secondary | ICD-10-CM | POA: Insufficient documentation

## 2012-02-20 DIAGNOSIS — K439 Ventral hernia without obstruction or gangrene: Secondary | ICD-10-CM | POA: Insufficient documentation

## 2012-02-20 DIAGNOSIS — R109 Unspecified abdominal pain: Secondary | ICD-10-CM

## 2012-02-20 DIAGNOSIS — R1900 Intra-abdominal and pelvic swelling, mass and lump, unspecified site: Secondary | ICD-10-CM

## 2012-02-20 DIAGNOSIS — R112 Nausea with vomiting, unspecified: Secondary | ICD-10-CM | POA: Insufficient documentation

## 2012-02-20 DIAGNOSIS — R1032 Left lower quadrant pain: Secondary | ICD-10-CM | POA: Insufficient documentation

## 2012-02-20 MED ORDER — IOHEXOL 300 MG/ML  SOLN
100.0000 mL | Freq: Once | INTRAMUSCULAR | Status: AC | PRN
  Administered 2012-02-20: 100 mL via INTRAVENOUS

## 2012-03-31 ENCOUNTER — Other Ambulatory Visit: Payer: Self-pay | Admitting: Pulmonary Disease

## 2012-04-02 ENCOUNTER — Telehealth: Payer: Self-pay | Admitting: Pulmonary Disease

## 2012-04-02 DIAGNOSIS — K439 Ventral hernia without obstruction or gangrene: Secondary | ICD-10-CM

## 2012-04-02 NOTE — Telephone Encounter (Signed)
Called and spoke with pt and she is aware of ct results per SN.  Pt is aware that we will set her up an appt with [facility] for eval of this abd wall hernia.  Pt voiced her understanding and nothing further is needed.

## 2012-04-02 NOTE — Telephone Encounter (Signed)
PT reports that Dr Jennette Kettle [facility]) ordered a Abd Ct in 02/2012 and had results sent to Dr Kriste Basque.  Results are in EMR.  Pt continues to c/o stomach pain and lump on left side.  Pt has seen Dr Randa Evens but she doesn't feel like he takes her complaints serioulsy.  Pt would like Dr Kriste Basque to look at her Ct results and let her know what else she needs to do. Please advise.

## 2012-04-02 NOTE — Telephone Encounter (Signed)
[id number].Jennifer Castillo, CMA  

## 2012-04-16 ENCOUNTER — Encounter (INDEPENDENT_AMBULATORY_CARE_PROVIDER_SITE_OTHER): Payer: Self-pay | Admitting: General Surgery

## 2012-04-16 ENCOUNTER — Ambulatory Visit (INDEPENDENT_AMBULATORY_CARE_PROVIDER_SITE_OTHER): Payer: Medicare Other | Admitting: General Surgery

## 2012-04-16 VITALS — BP 122/74 | HR 72 | Resp 16 | Ht 65.5 in | Wt 151.0 lb

## 2012-04-16 DIAGNOSIS — K439 Ventral hernia without obstruction or gangrene: Secondary | ICD-10-CM

## 2012-04-16 NOTE — Progress Notes (Signed)
Patient ID: [name], female   DOB: 06-30-1928, 78 y.o.   MRN: [id number]  Chief Complaint  Patient presents with  . Hernia    HPI [name] is a 78 y.o. female.   HPI  This is a very pleasant 78 year old African American female referred by Dr. Kriste Basque for evaluation of a left abdominal wall bulge. The patient states that she has had intermittent discomfort for about 3 months. She states that she will get a lump on the left side of her abdominal wall. This will cause her to have pain in the pit of her stomach. The frequency of the discomfort varies. The lump has always been able to be reduced. She denies any nausea or vomiting. She reports having a bowel movement every day. When she does have the abdominal pain she'll try to drink a boost which will help with the discomfort. She has had a prior hysterectomy. She denies any weight loss. She denies any fevers or chills. She lives alone.   Past Medical History  Diagnosis Date  . Glaucoma   . Hearing loss   . Chronic osteomyelitis, other specified site   . OSA (obstructive sleep apnea)   . Bronchitis, mucopurulent recurrent   . Hypertension   . CAD (coronary artery disease)   . Palpitations   . Hypercholesteremia   . Thyroid nodule   . Hiatal hernia   . GERD (gastroesophageal reflux disease)   . Diverticulosis of colon   . Personal history of other diseases of digestive system   . Hx of adenomatous colonic polyps   . DJD (degenerative joint disease)   . Lumbar back pain   . Vitamin d deficiency   . Abnormality of gait   . Anxiety   . Anemia     Past Surgical History  Procedure Date  . Appendectomy   . Cholecystectomy   . Abdominal hysterectomy   . Total knee arthroplasty     left  . Total knee arthroplasty     right  . Cataract extraction     No family history on file.  Social History History  Substance Use Topics  . Smoking status: Never Smoker   . Smokeless tobacco: Never Used  . Alcohol Use: No     Allergies  Allergen Reactions  . Amoxicillin     REACTION: itching  . Cephalexin     REACTION: itching  . Penicillins     REACTION: itching    Current Outpatient Prescriptions  Medication Sig Dispense Refill  . atorvastatin (LIPITOR) 10 MG tablet TAKE 1 TABLET BY MOUTH ONCE A DAY  30 tablet  9  . calcium-vitamin D (OSCAL WITH D) 500-200 MG-UNIT per tablet Take 1 tablet by mouth daily.        . cholecalciferol (VITAMIN D) 1000 UNITS tablet Take 1,000 Units by mouth daily.        . dorzolamide (TRUSOPT) 2 % ophthalmic solution Place 1 drop into both eyes 3 (three) times daily.        [name] esomeprazole (NEXIUM) 40 MG capsule Take 1 capsule (40 mg total) by mouth daily.  30 capsule  3  . felodipine (PLENDIL) 5 MG 24 hr tablet TAKE 1 TABLET EVERY DAY  30 tablet  4  . LORazepam (ATIVAN) 0.5 MG tablet TAKE 1/2 TO 1 TABLET BY MOUTH THREE TIMES DAILY AS NEEDED FOR NERVES       . losartan-hydrochlorothiazide (HYZAAR) 100-25 MG per tablet TAKE 1 TABLET EVERY DAY  30  tablet  5  . meloxicam (MOBIC) 7.5 MG tablet Take 1 tablet (7.5 mg total) by mouth daily. AS NEEDED FOR ARTHRITIS PAIN  30 tablet  11  . metoprolol succinate (TOPROL-XL) 50 MG 24 hr tablet TAKE 1 TABLET EVERY DAY  30 tablet  2  . Multiple Vitamins-Minerals (WOMENS MULTIVITAMIN PLUS) TABS Take 1 tablet by mouth daily.        . potassium chloride SA (K-DUR,KLOR-CON) 20 MEQ tablet Take 1 tablet (20 mEq total) by mouth 2 (two) times daily.  60 tablet  11  . venlafaxine (EFFEXOR) 75 MG tablet Take 150 mg by mouth daily before breakfast.        . DISCONTD: esomeprazole (NEXIUM) 40 MG capsule Take 40 mg by mouth daily before breakfast.       . DISCONTD: felodipine (PLENDIL) 5 MG 24 hr tablet Take 1 tablet (5 mg total) by mouth daily.  30 tablet  11    Review of Systems Review of Systems  Constitutional: Negative for fever, activity change, appetite change and fatigue.       Walks daily with her dog  HENT: Negative for hearing loss,  nosebleeds and neck pain.   Eyes: Negative for photophobia and visual disturbance.  Respiratory: Negative for chest tightness, shortness of breath and wheezing.   Cardiovascular: Negative for chest pain, palpitations and leg swelling.       Denies CP, SOB, orthopnea, PND.  Gastrointestinal:       See hpi  Genitourinary: Negative for dysuria, hematuria and difficulty urinating.  Musculoskeletal: Negative for back pain and arthralgias.       2 knee replacements; walks with cane  Neurological: Negative for tremors, seizures, speech difficulty, light-headedness and headaches.       Denies TIA, amaurosis fugax  Hematological: Negative for adenopathy.  Psychiatric/Behavioral: Negative for hallucinations, confusion and self-injury.    Blood pressure 122/74, pulse 72, resp. rate 16, height 5' 5.5" (1.664 m), weight 151 lb (68.493 kg).  Physical Exam Physical Exam  Vitals reviewed. Constitutional: She is oriented to person, place, and time. She appears well-developed and well-nourished. No distress.  HENT:  Head: Normocephalic and atraumatic.  Right Ear: External ear normal.  Left Ear: External ear normal.  Eyes: Conjunctivae normal are normal. No scleral icterus.  Neck: Normal range of motion. Neck supple. No tracheal deviation present. No thyromegaly present.  Cardiovascular: Normal rate, regular rhythm, normal heart sounds and intact distal pulses.   Pulmonary/Chest: Effort normal and breath sounds normal. No respiratory distress. She has no wheezes.  Abdominal: Soft. Bowel sounds are normal. There is no rigidity, no rebound and no guarding.         Reducible LLQ hernia- difficult to determine exact fascial dimensions. Some TTP around area  Musculoskeletal: She exhibits no edema and no tenderness.  Neurological: She is alert and oriented to person, place, and time. She exhibits normal muscle tone.  Skin: Skin is warm and dry. No rash noted. She is not diaphoretic. No erythema.    Psychiatric: She has a normal mood and affect. Her behavior is normal. Judgment and thought content normal.    Data Reviewed Dr Jodelle Green note from 11/2011  CT scan abd/pelvis CT ABDOMEN AND PELVIS WITH CONTRAST  Technique: Multidetector CT imaging of the abdomen and pelvis was  performed following the standard protocol during bolus  administration of intravenous contrast.  Contrast: OMNIPAQUE IOHEXOL 300 MG/ML SOLN  Comparison: CT abdomen pelvis 07/16/2011 report of CT abdomen  pelvis August 24, 2001  Findings: Lung bases demonstrate moderate cardiomegaly. Negative  for pleural effusion.  In the region of patient concern in the left lower quadrant, there  is an abdominal wall defect just lateral to the left rectus muscle,  consistent with a Spigelian hernia. The mouth of the hernia is  approximately 1.9 cm transverse dimension. Through the abdominal  wall defect courses several linear structures most compatible with  mesenteric vessels, as well as mesenteric fat. In total, the  hernia sac measures approximately 5.7 x 3.1 x 2.7 cm. This likely  accounts for the palpable mass. This hernia was described on a CT  of the abdomen pelvis of 2003 (images not currently available),  suggesting it is chronic. No definite bowel loops are seen within  this hernia. A curvilinear somewhat high density structure within  the hernia sac is favored to be due to mesenteric vasculature.  The stomach is not very distended at the time imaging and appears  within normal limits. The patient's bowel loops are well opacified  with oral contrast. There is no evidence of bowel obstruction.  The colon contains innumerable diverticula, most prominent in the  proximal colon, involving the ascending colon and proximal  transverse colon. No evidence of acute diverticulitis, abscess,  lymphadenopathy, or ascites.  There is a stable urinary bladder diverticulum extending from the  left posterolateral bladder  wall. Bladder wall thickness is  normal. Ureters are normal in caliber.  The liver, spleen, right adrenal gland, and kidneys are normal.  There is stable adreniform thickening of the left adrenal gland.  There is atherosclerotic calcification of the abdominal aorta and  iliac vasculature, without aneurysm.  Patient is status post hysterectomy and cholecystectomy. Negative  for adnexal mass.  There is multilevel degenerative disc disease, most prominent at L1-  L2. There is suggestion of posterior disc osteophyte complex at L1-  L2 and there is minimal, grade 1 retrolisthesis of L1 on L2 and of  L4 on L5. There are facet joint degenerative changes of the lumbar  spine. No suspicious bony lesion.   IMPRESSION:  1. Left abdominal wall spigelian hernia. This likely accounts for  the palpable area of concern in this region. The hernia sac  contains mesenteric fat and mesenteric vessels. No definite bowel  loops are seen within the hernia sac.  2. No evidence of intra-abdominal mass or lymphadenopathy.  3. Extensive colonic diverticulosis, without complicating  features.  4. Stable mild thickening of the left adrenal gland.  5. Left posterior urinary bladder diverticulum.  6. Aorto-iliac atherosclerosis.  7. Cardiomegaly.    Assessment    Left spigelian hernia    Plan    I have reviewed her CT scan with the patient. She has a left abdominal wall spigelian hernia. She appears to be symptomatic from it. I believe that she is at higher risk for incarceration and strangulation of this due to its location in her abdominal wall as opposed to a midline ventral hernia. We discussed observation versus surgical management. We also discussed open and laparoscopic repair. We discussed the risk and benefits of surgery including but not limited to bleeding, infection, injury to surrounding structures, need to convert to an open procedure, blood clot formation, seroma formation, anesthesia  complications, cardiac and pulmonary issues, postoperative ileus, hernia recurrence, mesh complications, as well as the typical postoperative course. I explained that this can be a quite uncomfortable procedure to recuperate from. I am concerned by the fact that she has no immediate living relative  within the state. I explained to her that she would most likely need to go to a short-term rehabilitation facility to recuperate since she has no immediate help to assist her with. She has told me that Maudie Mercury serves as her health care power of attorney. She appears to be in relatively decent health given her age; therefore, I have recommended surgery because I believe that she is at higher risk for incarceration and strangulation. However she will need medical clearance prior to scheduling surgery. She is going to think about it for a few days and let us know her decision  [name]. Andrey Campanile, MD, FACS General, Bariatric, & Minimally Invasive Surgery [facility], [place]        [facility] M 04/16/2012, 3:28 PM

## 2012-04-16 NOTE — Patient Instructions (Signed)
Once we get medical clearance we will contact you to schedule surgery  Hernia Repair with Laparoscope A hernia occurs when an internal organ pushes out through a weak spot in the belly (abdominal) wall muscles. Hernias most commonly occur in the groin and around the navel. Hernias can also occur through a cut by the surgeon (incision) after an abdominal operation. A hernia may be caused by:  Lifting heavy objects.   Prolonged coughing.   Straining to move your bowels.  Hernias can often be pushed back into place (reduced). Most hernias tend to get worse over time. Problems occur when abdominal contents get stuck in the opening and the blood supply is blocked or impaired (incarcerated hernia). Because of these risks, you require surgery to repair the hernia. Your hernia will be repaired using a laparoscope. Laparoscopic surgery is a type of minimally invasive surgery. It does not involve making a typical surgical cut (incision) in the skin. A laparoscope is a telescope-like rod and lens system. It is usually connected to a video camera and a light source so your caregiver can clearly see the operative area. The instruments are inserted through  to  inch (5 mm or 10 mm) openings in the skin at specific locations. A working and viewing space is created by blowing a small amount of carbon dioxide gas into the abdominal cavity. The abdomen is essentially blown up like a balloon (insufflated). This elevates the abdominal wall above the internal organs like a dome. The carbon dioxide gas is common to the human body and can be absorbed by tissue and removed by the respiratory system. Once the repair is completed, the small incisions will be closed with either stitches (sutures) or staples (just like a paper stapler only this staple holds the skin together). LET YOUR CAREGIVERS KNOW ABOUT:  Allergies.   Medications taken including herbs, eye drops, over the counter medications, and creams.   Use of  steroids (by mouth or creams).   Previous problems with anesthetics or Novocaine.   Possibility of pregnancy, if this applies.   History of blood clots (thrombophlebitis).   History of bleeding or blood problems.   Previous surgery.   Other health problems.  BEFORE THE PROCEDURE  Laparoscopy can be done either in a hospital or out-patient clinic. You may be given a mild sedative to help you relax before the procedure. Once in the operating room, you will be given a general anesthesia to make you sleep (unless you and your caregiver choose a different anesthetic).  AFTER THE PROCEDURE  After the procedure you will be watched in a recovery area. Depending on what type of hernia was repaired, you might be admitted to the hospital or you might go home the same day. With this procedure you may have less pain and scarring. This usually results in a quicker recovery and less risk of infection. HOME CARE INSTRUCTIONS   Bed rest is not required. You may continue your normal activities but avoid heavy lifting (more than 10 pounds) or straining.   Cough gently. If you are a smoker it is best to stop, as even the best hernia repair can break down with the continual strain of coughing.   Avoid driving until given the OK by your surgeon.   There are no dietary restrictions unless given otherwise.   TAKE ALL MEDICATIONS AS DIRECTED.   Only take over-the-counter or prescription medicines for pain, discomfort, or fever as directed by your caregiver.  SEEK MEDICAL CARE IF:  There is increasing abdominal pain or pain in your incisions.   There is more bleeding from incisions, other than minimal spotting.   You feel light headed or faint.   You develop an unexplained fever, chills, and/or an oral temperature above 102 F (38.9 C).   You have redness, swelling, or increasing pain in the wound.   Pus coming from wound.   A foul smell coming from the wound or dressings.  SEEK IMMEDIATE  MEDICAL CARE IF:   You develop a rash.   You have difficulty breathing.   You have any allergic problems.  MAKE SURE YOU:   Understand these instructions.   Will watch your condition.   Will get help right away if you are not doing well or get worse.  Document Released: 07/22/2005 Document Revised: 07/11/2011 Document Reviewed: 06/21/2009 [facility] Patient Information 2012 [place], [place].

## 2012-04-22 ENCOUNTER — Encounter (INDEPENDENT_AMBULATORY_CARE_PROVIDER_SITE_OTHER): Payer: Self-pay

## 2012-04-28 ENCOUNTER — Other Ambulatory Visit: Payer: Self-pay | Admitting: Pulmonary Disease

## 2012-05-11 ENCOUNTER — Encounter: Payer: Self-pay | Admitting: *Deleted

## 2012-05-12 ENCOUNTER — Ambulatory Visit (INDEPENDENT_AMBULATORY_CARE_PROVIDER_SITE_OTHER): Payer: Medicare Other | Admitting: Pulmonary Disease

## 2012-05-12 ENCOUNTER — Encounter: Payer: Self-pay | Admitting: Pulmonary Disease

## 2012-05-12 ENCOUNTER — Encounter (HOSPITAL_COMMUNITY): Payer: Self-pay | Admitting: Pharmacy Technician

## 2012-05-12 VITALS — BP 122/70 | HR 60 | Temp 97.7°F | Ht 65.5 in | Wt 148.2 lb

## 2012-05-12 DIAGNOSIS — M545 Low back pain, unspecified: Secondary | ICD-10-CM

## 2012-05-12 DIAGNOSIS — E041 Nontoxic single thyroid nodule: Secondary | ICD-10-CM

## 2012-05-12 DIAGNOSIS — E7800 Pure hypercholesterolemia, unspecified: Secondary | ICD-10-CM

## 2012-05-12 DIAGNOSIS — I2510 Atherosclerotic heart disease of native coronary artery without angina pectoris: Secondary | ICD-10-CM

## 2012-05-12 DIAGNOSIS — K5730 Diverticulosis of large intestine without perforation or abscess without bleeding: Secondary | ICD-10-CM

## 2012-05-12 DIAGNOSIS — G4733 Obstructive sleep apnea (adult) (pediatric): Secondary | ICD-10-CM

## 2012-05-12 DIAGNOSIS — R634 Abnormal weight loss: Secondary | ICD-10-CM

## 2012-05-12 DIAGNOSIS — M1990 Unspecified osteoarthritis, unspecified site: Secondary | ICD-10-CM

## 2012-05-12 DIAGNOSIS — F411 Generalized anxiety disorder: Secondary | ICD-10-CM

## 2012-05-12 DIAGNOSIS — K219 Gastro-esophageal reflux disease without esophagitis: Secondary | ICD-10-CM

## 2012-05-12 DIAGNOSIS — I10 Essential (primary) hypertension: Secondary | ICD-10-CM

## 2012-05-12 DIAGNOSIS — R269 Unspecified abnormalities of gait and mobility: Secondary | ICD-10-CM

## 2012-05-12 DIAGNOSIS — K439 Ventral hernia without obstruction or gangrene: Secondary | ICD-10-CM

## 2012-05-12 NOTE — Patient Instructions (Addendum)
Today we updated your med list in our EPIC system...    Continue your current medications the same...  We will follow up on the studies done in the hosp for your up coming hernia surg...  Today we did repeat thyroid blood tests for completeness...    We will call you w/ the results...  Be sure to maintain your 3 meals & the Boost nutritonal supplements...  Let's plan a recheck in 3-4 months.Marland KitchenMarland Kitchen

## 2012-05-12 NOTE — Progress Notes (Addendum)
Subjective:    Patient ID: [name], female    DOB: [birth year], 78 y.o.   MRN: [id number]  HPI 78 y/o BF here for a 6 month follow up visit...  she has multiple medical problems as noted below...  Followed for general medical purposes w/ hx of OSA, HBP, CAD, Hyperchol, Multinod thyroid, HH, Divertics & polyps, DJD, Anxiety, etc...  ~  [id number]:  her CC is "tired- no energy" & we discussed checking blood work... needs to incr exercise program/ mobility, take MVI/ VitD... she saw [name] 11/10 for f/u HBP, palpit, Lipids> event recorder showed PACs/ PVCs only & no med changes made... she has severe DJD & bilat TKRs> uses Mobic Prn... still under the care of [name] for psyche- & remains on Wellbutrin, Celexa, Lorazepam... ~  Jul11:  she fell recently & hurt her left hand w/ some persist tingling & she will check w/ her Ortho, [name]... otherw stable- using CPAP & tol well;  BP controlled on meds;  no CP/ angina;  Lipids controlled on Lip10;  GI is stable;  DJD on Mobic; and nerve meds changed by [name]...   ~  August 16, 2010:  she has hx divertics/ IBS & developed some diarrhea & fecal incontinence several times per week & at night> [name] tried to refer her to [facility] for the needed studies but she misunderstood somehow, anyway she says all symptoms better w/ regular dosing of Metamucil... she is overall improved> has a new dog= poodle mix "Keebler" & it helps her to walk... BP controlled on meds;  denies CP, palpit, SOB, etc;  f/u labs all look good;  she notes she is due for GYN check w/ [name] & will inquire about BMD as well...  ~  February 18, 2011:  48mo ROV & [name] is stable overall> notes some memory problems and has had some intermittent palpitations- she's been evaluated by [name] for Cards (seen 2/12) & event monitor revealed some PACs & PVCs- asked to take an extra 1/4 of the ToprolXL as needed...  She also had GI eval by [name] for mult GI symptoms- gas, fecal incont- w/  Colonoscopy 5/12 showing divertics, hems, otherw neg & bx was neg/benign no microscopic colitis;  Asked to decr milk products, given Hyoscyamine 0.125mg  SL Qid prn...  Given TDAP vaccine today.  ~  July 11, 2011:  56mo ROV & add-on for wt loss> pt called very concerned about losing "20lbs in 3 months" but on eval today it is only 11# in 56months from 177==>166# w/ her 82" height her current BMI is 27.5;  She notes good appetite, usually eats 3 meals/d, and denies any GI symptoms- w/o jaw prob, swallowing difficulty, abd pain, N/V, D/C/blood, etc; she had colonoscopy 5/12 by [name] w/ divertics, hems, no polyps, no lesions; still she is quite concerned & we outlined an evaluation including labs & CT Chest/ Abd/ Pelvis to address her concerns and r/o underlying/ hidden reason for her wt loss...    BP well regulated on meds & she denies CP, palpit, dizzy, SOB, edema, etc;  Chol looks good on Lip10;  She has thyroid nodule but has been clinically & biochemically euthyroid;  She notes a little arthritis pain in her neck & wants refill Mobic;  NOTE:  LABS ret w/ K=3.0 on her Hyzaar 100-25 & K20Bid> [facility] called [facility] & they note Hyzaar filled 10/31 #30 & 9/15 #30, but K20 last filled 09/01/09- we called in #60 1Bid (pt asked again to  bring all meds to every visit);  See prob list below>>  ~  November 11, 2011:  60mo ROV & [name] continues to complain of weight loss (wt=163# down 3# further in the last 60mo) despite drinking Boost Bid she says; reminded to eat 3 meals regularly & use the supplement betw meals as a "medication";  She intermittently & irregularly c/o abd discomfort- sometimes when she eats, sometimes in LLQ area; Denies n/v, dysphagia, reflux symptoms, change in bowels or blood seen; she sees DrJEdwards for GI> as noted above- she had colonoscopy 5/12 by [name] w/ divertics, hems, no polyps, no lesions; and we did CT Chest/ Abd/ Pelvis 12/12 w/ cardiomeg, divertics,but otherw neg & no lesions  seen... ?she wants further eval & we discussed referral back to GI for their review & recommendations>> (Appt sched for 4/11 at 10:30AM)...    During last OV her potassium was low at 3.0 & call to Pharm indicated she wasn't filling her KCl supplement (on Hyzaar for BP- see below); we called in K20Bid & she tells me she is taking it properly ever since> K=3.8 today, continue same...    LABS 4/13:  FLP on Lip10 showed TChol 223, TG 90, HDL 62, LDL 140 (I called Pharm- hasn't refilled in 5 months!);  Chems- wnl w/ K=3.8 on K20Bid...  ~  May 12, 2012:  86mo ROV & [name], [facility] plans spigelian hernia repair soon;  Weight is down to 148#, still w/ pretty good appetite, eating 3 meals + Boost etc... She has no specific complaints & her pre-op labs all look good...    We reviewed prob list, meds, xrays and labs> see below for updates >>  CXR 10/13 showed heart at upper lim of norm, lungs clear, NAD.Marland KitchenMarland Kitchen EKG 10/13 showed Sinus arrhythmia, rate63, incr voltage, NSSTTWA, no change from prev... LABS 10/13:  Chems- wnl w/ creat=0.91;  CBC- wnl w/ Hg=13.8 & Alb=3.5;  TFTs= wnl, euthyroid...          Problem List:       GLAUCOMA - on eye drops from [name]...  HEARING LOSS - saw [name] for hearing eval & they discussed hearing aides.  CHRONIC OSTEOMYELITIS OTHER SPECIFIED SITES (ICD-730.18) - she developed osteomyelitis of the right mandible 2009 related to a tooth abcess and path showed bact colonies consistant w/ Actinomyces- oral surg= [name] & seen by ID- [name] and was treated w/ Clindamycin (due to Pen allergy)...  she stopped the Clindamycin on her own... last seen by [name] 4/10 (6mo off Clinda) and doing satis- he released her... still follows up w/ [name] for OralSurg...  OBSTRUCTIVE SLEEP APNEA (ICD-327.23) - mod OSA w/ sleep study 8/04 showing RDI=23, consult [name]... Rx CPAP 10... states she is using CPAP regularly and tol well...  BRONCHITIS, RECURRENT (ICD-491.9) - no recent  upper resp infection... ~  CXR 1/11 showed clear lungs, WNL.[name]. ~  CT Chest (along w/ Abd/Pelvis) 12/12 due to c/o wt loss showed cardiomegaly, clear lungs, NAD...  HYPERTENSION (ICD-401.9) - controlled on TOPROL XL 50mg /d, PLENDIL 5mg /d, HYZAAR 100/25 daily, & KCl Bid...  ~  7/12: BP=136/74 and asymptomatic- denies HA, fatigue, visual changes, CP, palipit, dizziness, syncope, dyspnea, edema, etc... she has a long hx of chr noncompliance w/ med Rx... ~  12/12:  BP= 124/82 and she notes wt loss & some weakness; Labs revealed K=3.0 & pharm confirms she hasn't filled K20 since 1/11> asked to bring all meds bottles to every visit & we called in K20 1Bid #60... ~  4/13:  BP= 132/88 & she confirms taking meds properly + the K20Bid; LABS showed K=3.8, Renal= wnl, Creat=0.9; rec to continue same meds... ~  10/13:  BP= 122/70 & she remains asymptomatic...  CAD (ICD-414.00) - non-obstructive disease w/ cath 1/06 showing 30% focal stenosis in CIRC... NuclearStressTest 9/07 showed apical thinning, no ischemia, EF=69%... followed by [name] for her HBP, palpit, lipids> Event recoder showed benign PVCs & PACs, no change in med Rx... she can't take ASA, she says.  HYPERCHOLESTEROLEMIA (ICD-272.0) - on LIPITOR 10mg /d...  ~  FLP 8/08 w/ TChol 174, TG 76, HDL 65, LDL 94 ~  FLP 7/09 showed TChol 173, TG 76, HDL 57, LDL 101 ~  FLP 7/10 showed TChol 174, TG 94, HDL 57, LDL 99 ~  FLP 7/11 showed TChol 166, TG 90, HDL 58, LDL 91 ~  FLP 1/12 on Lip10 showed TChol 158, TG 91, HDL 53, LDL 86 ~  FLP 4/13 on Lip10 showed TChol 223, TG 90, HDL 62, LDL 140; what happened? Call to pharm revealed that she hasn't refilled Lip10 in 5months! Rec to take daily...  Hx of THYROID NODULE (ICD-241.0) - she has multinodular thyroid, no palp nodules, clinically euthyroid & doing well...  ~  labs 8/08 showed TSH = 0.87 ~  labs 7/09 showed TSH = 0.70 ~  labs 7/10 showed TSH= 1.12 ~  labs 1/11 showed TSH= 1.06 ~  labs 1/12 showed  TSH= 1.53 ~  Labs 12/12 showed TSH= 1.08, FreeT3= 2.5 (2.3-4.2). FreeT4= 0.66 (0.60-1.60)  HIATAL HERNIA (ICD-553.3) - on NEXIUM 40mg /d... HH on prev CTscans... EGD in 1998 w/ distal esoph stricture dilated... no recurrent dysphagia etc... ~  last EGD 7/09 by [name] showed 4cmHH, GERD, gastritis & gastic polyps- neg HPylori... Rx w/ NEXIUM 40mg Bid ==> then weaned to one per day.  DIVERTICULOSIS OF COLON (ICD-562.10), & COLONIC POLYPS, ADENOMATOUS, HX OF (ICD-V12.72) - colonoscopy 5/05 by [name] w/ several sm 3-4 mm polyps, divertics, & f/u planned 18yrs... hosp in 2006 w/ divertic bleed that resolved spont... repeat hosp and f/u colonoscopy 7/09 showed divertics, and several 4-61mm adenomatous polyps... ~  11/11:  eval by [name] for diarrhea & fecal incontinence> he rec refer to [facility], she declined & says she improved w/ METAMUCIL daily. ~  5/12:  She sought second opinion GI from [name]> colonoscopy w/ divertics, hems, no colitis; Asked to decr milk products, given Hyoscyamine 0.125mg  SL Qid prn... ~  CT Abd 7/13 showed LLQ Spigelian hernia on CT Abd per [name]; pt referred to [facility] & [name] plans hernia repair soon...  Pt concern for weight loss >> despite good appetite, eating well she says, taking Boost supplements Bid... ~  Prev GI evals from [name], then DrJEdwards as above... ~  7/12:  Weight = 177# ~  12/12:  Weight = 166# ~  CT Chest/Abd/Pelvis 12/12 showed cardiomeg, clear lungs & no lesions in the chest; Divertics & otherw no signif lesions in the Abd... ~  4/13:  Weight = 163#; Pt still c/o wt loss & we will refer back to GI for further assessment & on-going management... ~  CT Abd&Pelvis 7/13 by DrRNeal showed cardiomegaly, prob abd wall hernia in LLQ c/w Spigelian hernia w/ 5-6cm hernia sac, numerous divertics (no complic), calcif in Ao & iliacs, s/p hyst/ GB, multilevel DDD, left posterior urinary bladder divertic, NO MASSES etc... ~  10/13:  Weight is down to 148# still w/  pretty good appetite, eating 3 meals + Boost etc...  DEGENERATIVE JOINT  DISEASE (ICD-715.90) - followed by [name] w/ severe DJD, s/p bilat TKRs... S/P Left TKR in 12/04 by [name] & Right TKR 7/02 by DrKrege... known CSpine and L/S spine disease... s/p recent epid steroid shot in lower back w/ improvement... we refilled her MOBIC 7.5mg  for as needed use. ~  8/11:  she fell & saw [name] & DrNewton for pain & paresthesias in left arm & improved w/ CSpine injection.  BACK PAIN, LUMBAR (ICD-724.2) >> she has known multilevel DDD w/ disc/osteophyte complexes, retrolisthesis, etc (see CT Abd 7/13)...  VITAMIN D DEFICIENCY (ICD-268.9) - Vit D level Jul09 = 19 & started on Vit D 50K weekly... ~  labs 7/10 showed Vit D level = 33... OK to switch to OTC Vit D 1000 u daily, also takes MVI & Calcium. ~  labs 1/12 showed Vit D level = 26... rec incr OTC supplement to 2000 u daily. ~  Labs 12/12 showed Vit D level = 35... rec to continue 2000u daily.  ABNORMALITY OF GAIT (ICD-781.2)  ANXIETY (ICD-300.00) - she is under the care of [name] for Psychiatry now on EFFEXOR XR 75mg - 2 tabs Qam, & LORAZEPAM 0.5mg  as needed... she was prev on Wellbutrin & Celexa, but [name] makes freq changes.  ANEMIA (ICD-285.9) - after her diverticular bleed in 2006... on Fe therapy w/ resolution... ~  labs 8/08 showed Hg= 12.8 ~  labs 7/10 showed Hg= 13.2 ~  labs 1/11 showed Hg= 13.9, Fe= 66 (16%sat) ~  labs 1/12 showed Hg= 13.5 ~  Labs 12/12 showed Hg= 14.0  HEALTH MAINTENANCE:  She gets the annual Flu vaccines each fall;  Given TDAP 7/12...   Past Surgical History  Procedure Date  . Appendectomy   . Cholecystectomy   . Abdominal hysterectomy   . Total knee arthroplasty     left  . Total knee arthroplasty     right  . Cataract extraction     Outpatient Encounter Prescriptions as of 05/12/2012  Medication Sig Dispense Refill  . atorvastatin (LIPITOR) 10 MG tablet Take 10 mg by mouth daily.      .  dorzolamide-timolol (COSOPT) 22.3-6.8 MG/ML ophthalmic solution Place 1 drop into both eyes Three times a day.      . esomeprazole (NEXIUM) 40 MG capsule Take 1 capsule (40 mg total) by mouth daily.  30 capsule  3  . felodipine (PLENDIL) 5 MG 24 hr tablet Take 5 mg by mouth daily.      [name] LORazepam (ATIVAN) 0.5 MG tablet Take 0.5 mg by mouth at bedtime.      [name] losartan-hydrochlorothiazide (HYZAAR) 100-25 MG per tablet Take 1 tablet by mouth daily.      . meloxicam (MOBIC) 7.5 MG tablet Take 7.5 mg by mouth daily as needed. For arthritis pain      . metoprolol succinate (TOPROL-XL) 50 MG 24 hr tablet Take 50 mg by mouth daily.      . Multiple Vitamins-Minerals (CENTRUM SILVER ULTRA WOMENS PO) Take 1 tablet by mouth daily.      [name] venlafaxine (EFFEXOR) 37.5 MG tablet Take 37.5 mg by mouth 2 (two) times daily.        Allergies  Allergen Reactions  . Amoxicillin     REACTION: itching  . Cephalexin     REACTION: itching  . Penicillins     REACTION: itching    Current Medications, Allergies, Past Medical History, Past Surgical History, Family History, and Social History were reviewed in [employer] record.  Review of Systems         See HPI - all other systems neg except as noted...   The patient complains of dyspnea on exertion and difficulty walking.  The patient denies anorexia, fever, weight loss, weight gain, vision loss, decreased hearing, hoarseness, chest pain, syncope, peripheral edema, prolonged cough, headaches, hemoptysis, abdominal pain, melena, hematochezia, severe indigestion/heartburn, hematuria, incontinence, muscle weakness, suspicious skin lesions, transient blindness, depression, unusual weight change, abnormal bleeding, enlarged lymph nodes, and angioedema.     Objective:   Physical Exam      WD, Overweight, 78 y/o BF in NAD... wt= 177#  GENERAL:  Alert, pleasant & cooperative... I did not do formal MMSE... HEENT:  [place]/AT, EOM-wnl, PERRLA,  EACs-clear, TMs-wnl, NOSE-clear, THROAT-clear & wnl, JAW- no pain or tenderness. NECK:  Supple w/ fairROM; no JVD; normal carotid impulses w/o bruits; no thyromegaly or nodules palpated (cannot feel the multinod goiter), no lymphadenopathy. CHEST:  Clear to P & A; without wheezes/ rales/ or rhonchi heard... HEART:  Regular Rhythm; without murmurs/ rubs/ or gallops detected... ABDOMEN:  Soft & nontender; normal bowel sounds; no organomegaly or masses palpated... EXT:  s/p bilat TKRs, mod arthritic changes; no varicose veins/ +venous insuffic/ tr edema. NEURO:  CN's intact; no focal neuro deficits... DERM:  No lesions noted; no rash etc...  RADIOLOGY DATA:  Reviewed in the EPIC EMR & discussed w/ the patient...  LABORATORY DATA:  Reviewed in the EPIC EMR & discussed w/ the patient...   Assessment & Plan:    Weight Loss>  No obvious cause on our eval as above; she is concerned & has had thorough evaluations by GI, GYN, etc...  Hx OSA> prev eval by [name], stable on CPAP...  HBP>  Controlled on Toprol, Plendil, Hyzaar; continue same... NOTE: K was 3.0 12/12 & Pharm confirmed not refilled for 10 months; now on K20Bid & repeat K=3.8, continue same...  CAD>  Followed by [name], stable on meds, no angina...  Palpit>  She had PACs & PVCs on Holter, symptoms improved w/ BBlocker, takes extra 1/4-1/2 as needed...  CHOL>  She was stable on Lip10 + diet Rx; Pharm confirms Lipitor not refilled in 52mo & FLP not at goal; asked to restart Lip10 & stay on it...  Multinod Thyroid>  Clinically & biochem euthyroid, not on meds...  GI>  eval by [name] reviewed> HH, GERD, hx stricture, Divertics, Hems, Polyps; she has some wt loss & vague intermittent symptoms w/ some epig discomfort so we will have her incr the Nexium to Bid for now...  DJD>  Followed by [name], on Mobic prn, exercises by walking w/ "Keebler"...  Anxiety>  followed by [name] on Effexor & Lorazepam...   Patient's Medications   New Prescriptions   No medications on file  Previous Medications   ATORVASTATIN (LIPITOR) 10 MG TABLET    Take 10 mg by mouth daily.   DORZOLAMIDE-TIMOLOL (COSOPT) 22.3-6.8 MG/ML OPHTHALMIC SOLUTION    Place 1 drop into both eyes Three times a day.   ESOMEPRAZOLE (NEXIUM) 40 MG CAPSULE    Take 1 capsule (40 mg total) by mouth daily.   FELODIPINE (PLENDIL) 5 MG 24 HR TABLET    Take 5 mg by mouth daily.   LORAZEPAM (ATIVAN) 0.5 MG TABLET    Take 0.5 mg by mouth at bedtime.   LOSARTAN-HYDROCHLOROTHIAZIDE (HYZAAR) 100-25 MG PER TABLET    Take 1 tablet by mouth daily.   MELOXICAM (MOBIC) 7.5 MG TABLET    Take 7.5 mg  by mouth daily as needed. For arthritis pain   METOPROLOL SUCCINATE (TOPROL-XL) 50 MG 24 HR TABLET    Take 50 mg by mouth daily.   MULTIPLE VITAMINS-MINERALS (CENTRUM SILVER ULTRA WOMENS PO)    Take 1 tablet by mouth daily.   VENLAFAXINE (EFFEXOR) 37.5 MG TABLET    Take 37.5 mg by mouth 2 (two) times daily.  Modified Medications   No medications on file  Discontinued Medications   No medications on file

## 2012-05-13 ENCOUNTER — Other Ambulatory Visit: Payer: Self-pay | Admitting: Pulmonary Disease

## 2012-05-13 ENCOUNTER — Encounter (HOSPITAL_COMMUNITY)
Admission: RE | Admit: 2012-05-13 | Discharge: 2012-05-13 | Disposition: A | Discharge status: Home / Self Care | Payer: Medicare Other | Source: Ambulatory Visit | Attending: General Surgery | Admitting: General Surgery

## 2012-05-13 ENCOUNTER — Ambulatory Visit (HOSPITAL_COMMUNITY)
Admission: RE | Admit: 2012-05-13 | Discharge: 2012-05-13 | Disposition: A | Discharge status: Home / Self Care | Payer: Medicare Other | Source: Ambulatory Visit | Attending: General Surgery | Admitting: General Surgery

## 2012-05-13 ENCOUNTER — Encounter (HOSPITAL_COMMUNITY): Payer: Self-pay

## 2012-05-13 DIAGNOSIS — Z01818 Encounter for other preprocedural examination: Secondary | ICD-10-CM | POA: Insufficient documentation

## 2012-05-13 DIAGNOSIS — Z01810 Encounter for preprocedural cardiovascular examination: Secondary | ICD-10-CM | POA: Insufficient documentation

## 2012-05-13 DIAGNOSIS — E041 Nontoxic single thyroid nodule: Secondary | ICD-10-CM

## 2012-05-13 DIAGNOSIS — Z01812 Encounter for preprocedural laboratory examination: Secondary | ICD-10-CM | POA: Insufficient documentation

## 2012-05-13 HISTORY — DX: Depression, unspecified: F32.A

## 2012-05-13 HISTORY — DX: Major depressive disorder, single episode, unspecified: F32.9

## 2012-05-13 HISTORY — DX: Cardiac arrhythmia, unspecified: I49.9

## 2012-05-13 LAB — COMPREHENSIVE METABOLIC PANEL
AST: 23 U/L (ref 0–37)
Albumin: 3.5 g/dL (ref 3.5–5.2)
Alkaline Phosphatase: 60 U/L (ref 39–117)
CO2: 30 mEq/L (ref 19–32)
Chloride: 104 mEq/L (ref 96–112)
Creatinine, Ser: 0.91 mg/dL (ref 0.50–1.10)
GFR calc non Af Amer: 57 mL/min — ABNORMAL LOW (ref >90)
Potassium: 3.3 mEq/L — ABNORMAL LOW (ref 3.5–5.1)
Total Bilirubin: 0.7 mg/dL (ref 0.3–1.2)

## 2012-05-13 LAB — CBC WITH DIFFERENTIAL/PLATELET
Basophils Absolute: 0 10*3/uL (ref 0.0–0.1)
Basophils Relative: 1 % (ref 0–1)
HCT: 42.7 % (ref 36.0–46.0)
Hemoglobin: 13.8 g/dL (ref 12.0–15.0)
Lymphocytes Relative: 20 % (ref 12–46)
MCHC: 32.3 g/dL (ref 30.0–36.0)
Monocytes Absolute: 1.3 10*3/uL — ABNORMAL HIGH (ref 0.1–1.0)
Monocytes Relative: 16 % — ABNORMAL HIGH (ref 3–12)
Neutro Abs: 5.1 10*3/uL (ref 1.7–7.7)
Neutrophils Relative %: 62 % (ref 43–77)
RDW: 14.5 % (ref 11.5–15.5)
WBC: 8.3 10*3/uL (ref 4.0–10.5)

## 2012-05-13 NOTE — Pre-Procedure Instructions (Signed)
[address]  05/13/2012   Your procedure is scheduled on:  Thursday 05/21/12  Report to [facility] at 600 AM.  Call this number if you have problems the morning of surgery: [phone]   Remember:   Do not eat food OR DRINK :After Midnight   Take these medicines the morning of surgery with A SIP OF WATER: EYE DROPS, NEXIUM, FELODIPINE(PLENDIL), METOPROLOL(TOPROL), EFFEXOR (STOP AASPIRIN, COUMADIN, PLAVIX, EFFIENT, HERBAL MEDICINES)   Do not wear jewelry, make-up or nail polish.  Do not wear lotions, powders, or perfumes. You may wear deodorant.  Do not shave 48 hours prior to surgery. Men may shave face and neck.  Do not bring valuables to the hospital.  Contacts, dentures or bridgework may not be worn into surgery.  Leave suitcase in the car. After surgery it may be brought to your room.  For patients admitted to the hospital, checkout time is 11:00 AM the day of discharge.   Patients discharged the day of surgery will not be allowed to drive home.  Name and phone number of your driver:   Special Instructions: Shower using CHG 2 nights before surgery and the night before surgery.  If you shower the day of surgery use CHG.  Use special wash - you have one bottle of CHG for all showers.  You should use approximately 1/3 of the bottle for each shower.   Please read over the following fact sheets that you were given: Pain Booklet, Coughing and Deep Breathing, MRSA Information and Surgical Site Infection Prevention

## 2012-05-14 ENCOUNTER — Other Ambulatory Visit (INDEPENDENT_AMBULATORY_CARE_PROVIDER_SITE_OTHER): Payer: Medicare Other

## 2012-05-14 DIAGNOSIS — E041 Nontoxic single thyroid nodule: Secondary | ICD-10-CM

## 2012-05-14 LAB — T4, FREE: Free T4: 0.78 ng/dL (ref 0.60–1.60)

## 2012-05-14 NOTE — Consult Note (Signed)
Anesthesia chart: Patient is an 78 year old female scheduled for laparoscopic repair of spigelian hernia on 05/21/12 by Dr. Andrey Campanile.  History includes nonsmoker, glaucoma, hearing loss, hypertension, GERD, hypercholesterolemia, gait abnormality, anemia, depression, hiatal hernia, DJD, OSA, anxiety, diverticulosis, mild non-obstructive CAD per cath in '06, palpitations (Dr. [name], last visit 09/2010).  Her PCP is Dr. Kriste Basque.  Her feels she is "okay for hernia surgery" (See Media tab).  EKG on 05/13/12 showed sinus rhythm with marked arrhythmia, left axis deviation, moderate voltage criteria for LVH, nonspecific T wave abnormality. Left axis deviation appears new, but otherwise stable.  The interpreting cardiologist did no feel it was significantly changed from her previous EKG on 09/28/04.  By notes, she had a nonischemic Myoview in 2007.  Cardiac cath on 08/30/04 showed: 1. LV: 140/07/15. EF 60% with focal inferior hypokinesis.  2. Left main: Angiographically normal.  3. LAD: Very large vessel which wraps around the apex to supply much of the inferior wall. It gives rise to a single large diagonal branch. It is angiographically normal.  4. Circumflex: Large, technically dominant vessel. It gives rise to a single large branching obtuse marginal and the PDA. There is a 30% focal stenosis of the distal circumflex prior to the takeoff of the PDA.  5. RCA: Small, nondominant vessel. It is angiographically normal.  Chest x-ray on 05/13/2012 showed hyperexpansion without acute cardiopulmonary findings.  Labs noted.  If no significant change in her status then anticipate she can proceed as planned.  Shonna Chock, PA-C

## 2012-05-20 MED ORDER — VANCOMYCIN HCL IN DEXTROSE 1-5 GM/200ML-% IV SOLN
1000.0000 mg | INTRAVENOUS | Status: AC
  Administered 2012-05-21: 1000 mg via INTRAVENOUS
  Filled 2012-05-20: qty 200

## 2012-05-20 MED ORDER — CHLORHEXIDINE GLUCONATE 4 % EX LIQD: 1.0000 "application " | Freq: Once | CUTANEOUS | Status: DC

## 2012-05-21 ENCOUNTER — Encounter (HOSPITAL_COMMUNITY): Payer: Self-pay | Admitting: Vascular Surgery

## 2012-05-21 ENCOUNTER — Encounter (HOSPITAL_COMMUNITY)
Admission: RE | Disposition: A | Discharge status: Home / Self Care | Payer: Self-pay | Source: Ambulatory Visit | Attending: General Surgery

## 2012-05-21 ENCOUNTER — Ambulatory Visit (HOSPITAL_COMMUNITY): Payer: Medicare Other | Admitting: Vascular Surgery

## 2012-05-21 ENCOUNTER — Inpatient Hospital Stay (HOSPITAL_COMMUNITY)
Admission: RE | Admit: 2012-05-21 | Discharge: 2012-05-23 | DRG: 355 | Disposition: A | Discharge status: Home / Self Care | Payer: Medicare Other | Source: Ambulatory Visit | Attending: General Surgery | Admitting: General Surgery

## 2012-05-21 ENCOUNTER — Encounter (HOSPITAL_COMMUNITY): Payer: Self-pay | Admitting: *Deleted

## 2012-05-21 DIAGNOSIS — K449 Diaphragmatic hernia without obstruction or gangrene: Secondary | ICD-10-CM | POA: Diagnosis present

## 2012-05-21 DIAGNOSIS — Z860100 Personal history of colon polyps, unspecified: Secondary | ICD-10-CM

## 2012-05-21 DIAGNOSIS — H9190 Unspecified hearing loss, unspecified ear: Secondary | ICD-10-CM | POA: Diagnosis present

## 2012-05-21 DIAGNOSIS — Z8719 Personal history of other diseases of the digestive system: Secondary | ICD-10-CM

## 2012-05-21 DIAGNOSIS — G4733 Obstructive sleep apnea (adult) (pediatric): Secondary | ICD-10-CM | POA: Diagnosis present

## 2012-05-21 DIAGNOSIS — F329 Major depressive disorder, single episode, unspecified: Secondary | ICD-10-CM | POA: Diagnosis present

## 2012-05-21 DIAGNOSIS — M1990 Unspecified osteoarthritis, unspecified site: Secondary | ICD-10-CM | POA: Diagnosis present

## 2012-05-21 DIAGNOSIS — Z880 Allergy status to penicillin: Secondary | ICD-10-CM

## 2012-05-21 DIAGNOSIS — Z23 Encounter for immunization: Secondary | ICD-10-CM

## 2012-05-21 DIAGNOSIS — Z881 Allergy status to other antibiotic agents status: Secondary | ICD-10-CM

## 2012-05-21 DIAGNOSIS — E559 Vitamin D deficiency, unspecified: Secondary | ICD-10-CM | POA: Diagnosis present

## 2012-05-21 DIAGNOSIS — H409 Unspecified glaucoma: Secondary | ICD-10-CM | POA: Diagnosis present

## 2012-05-21 DIAGNOSIS — F3289 Other specified depressive episodes: Secondary | ICD-10-CM | POA: Diagnosis present

## 2012-05-21 DIAGNOSIS — Z9089 Acquired absence of other organs: Secondary | ICD-10-CM

## 2012-05-21 DIAGNOSIS — I2510 Atherosclerotic heart disease of native coronary artery without angina pectoris: Secondary | ICD-10-CM | POA: Diagnosis present

## 2012-05-21 DIAGNOSIS — Z90710 Acquired absence of both cervix and uterus: Secondary | ICD-10-CM

## 2012-05-21 DIAGNOSIS — K219 Gastro-esophageal reflux disease without esophagitis: Secondary | ICD-10-CM | POA: Diagnosis present

## 2012-05-21 DIAGNOSIS — K432 Incisional hernia without obstruction or gangrene: Secondary | ICD-10-CM

## 2012-05-21 DIAGNOSIS — Z79899 Other long term (current) drug therapy: Secondary | ICD-10-CM

## 2012-05-21 DIAGNOSIS — F411 Generalized anxiety disorder: Secondary | ICD-10-CM | POA: Diagnosis present

## 2012-05-21 DIAGNOSIS — E785 Hyperlipidemia, unspecified: Secondary | ICD-10-CM | POA: Diagnosis present

## 2012-05-21 DIAGNOSIS — E7800 Pure hypercholesterolemia, unspecified: Secondary | ICD-10-CM | POA: Diagnosis present

## 2012-05-21 DIAGNOSIS — K439 Ventral hernia without obstruction or gangrene: Principal | ICD-10-CM | POA: Diagnosis present

## 2012-05-21 DIAGNOSIS — I498 Other specified cardiac arrhythmias: Secondary | ICD-10-CM | POA: Diagnosis present

## 2012-05-21 DIAGNOSIS — I10 Essential (primary) hypertension: Secondary | ICD-10-CM | POA: Diagnosis present

## 2012-05-21 HISTORY — PX: INGUINAL HERNIA REPAIR: SUR1180

## 2012-05-21 HISTORY — PX: VENTRAL HERNIA REPAIR: SHX424

## 2012-05-21 SURGERY — REPAIR, HERNIA, VENTRAL, LAPAROSCOPIC
Anesthesia: General | Site: Abdomen | Wound class: Clean

## 2012-05-21 MED ORDER — MORPHINE SULFATE (PF) 1 MG/ML IV SOLN
INTRAVENOUS | Status: AC
  Filled 2012-05-21: qty 25

## 2012-05-21 MED ORDER — NEOSTIGMINE METHYLSULFATE 1 MG/ML IJ SOLN
INTRAMUSCULAR | Status: DC | PRN
  Administered 2012-05-21: 3 mg via INTRAVENOUS

## 2012-05-21 MED ORDER — ATORVASTATIN CALCIUM 10 MG PO TABS
10.0000 mg | ORAL_TABLET | Freq: Every day | ORAL | Status: DC
  Administered 2012-05-21 – 2012-05-22 (×2): 10 mg via ORAL
  Filled 2012-05-21 (×3): qty 1

## 2012-05-21 MED ORDER — HYDROMORPHONE HCL PF 1 MG/ML IJ SOLN: 0.2500 mg | INTRAMUSCULAR | Status: DC | PRN

## 2012-05-21 MED ORDER — ONDANSETRON HCL 4 MG/2ML IJ SOLN
INTRAMUSCULAR | Status: DC | PRN
  Administered 2012-05-21: 4 mg via INTRAVENOUS

## 2012-05-21 MED ORDER — ROCURONIUM BROMIDE 100 MG/10ML IV SOLN
INTRAVENOUS | Status: DC | PRN
  Administered 2012-05-21: 40 mg via INTRAVENOUS

## 2012-05-21 MED ORDER — PANTOPRAZOLE SODIUM 40 MG IV SOLR
40.0000 mg | INTRAVENOUS | Status: DC
  Administered 2012-05-21 – 2012-05-22 (×2): 40 mg via INTRAVENOUS
  Filled 2012-05-21 (×3): qty 40

## 2012-05-21 MED ORDER — LOSARTAN POTASSIUM 50 MG PO TABS
100.0000 mg | ORAL_TABLET | Freq: Every day | ORAL | Status: DC
  Administered 2012-05-21 – 2012-05-23 (×3): 100 mg via ORAL
  Filled 2012-05-21 (×3): qty 2

## 2012-05-21 MED ORDER — LIDOCAINE HCL (CARDIAC) 20 MG/ML IV SOLN
INTRAVENOUS | Status: DC | PRN
  Administered 2012-05-21: 80 mg via INTRAVENOUS

## 2012-05-21 MED ORDER — FENTANYL CITRATE 0.05 MG/ML IJ SOLN
INTRAMUSCULAR | Status: DC | PRN
  Administered 2012-05-21: 50 ug via INTRAVENOUS

## 2012-05-21 MED ORDER — ENOXAPARIN SODIUM 40 MG/0.4ML ~~LOC~~ SOLN
40.0000 mg | SUBCUTANEOUS | Status: DC
  Administered 2012-05-22: 40 mg via SUBCUTANEOUS
  Filled 2012-05-21 (×3): qty 0.4

## 2012-05-21 MED ORDER — ACETAMINOPHEN 10 MG/ML IV SOLN
1000.0000 mg | Freq: Four times a day (QID) | INTRAVENOUS | Status: AC
  Administered 2012-05-21 – 2012-05-22 (×4): 1000 mg via INTRAVENOUS
  Filled 2012-05-21 (×4): qty 100

## 2012-05-21 MED ORDER — DORZOLAMIDE HCL-TIMOLOL MAL 2-0.5 % OP SOLN
1.0000 [drp] | Freq: Every day | OPHTHALMIC | Status: DC
  Administered 2012-05-21 – 2012-05-22 (×2): 1 [drp] via OPHTHALMIC
  Filled 2012-05-21: qty 10

## 2012-05-21 MED ORDER — VENLAFAXINE HCL 37.5 MG PO TABS
37.5000 mg | ORAL_TABLET | Freq: Two times a day (BID) | ORAL | Status: DC
Start: 2012-05-21 — End: 2012-05-23
  Administered 2012-05-21 – 2012-05-23 (×5): 37.5 mg via ORAL
  Filled 2012-05-21 (×7): qty 1

## 2012-05-21 MED ORDER — 0.9 % SODIUM CHLORIDE (POUR BTL) OPTIME
TOPICAL | Status: DC | PRN
  Administered 2012-05-21: 1000 mL

## 2012-05-21 MED ORDER — ESMOLOL HCL 10 MG/ML IV SOLN
INTRAVENOUS | Status: DC | PRN
  Administered 2012-05-21: 30 mg via INTRAVENOUS

## 2012-05-21 MED ORDER — BUPIVACAINE 0.25 % ON-Q PUMP DUAL CATH 300 ML
300.0000 mL | INJECTION | Status: DC
  Filled 2012-05-21 (×2): qty 300

## 2012-05-21 MED ORDER — PROMETHAZINE HCL 25 MG/ML IJ SOLN: 6.2500 mg | INTRAMUSCULAR | Status: DC | PRN

## 2012-05-21 MED ORDER — PROPOFOL 10 MG/ML IV BOLUS
INTRAVENOUS | Status: DC | PRN
  Administered 2012-05-21: 100 mg via INTRAVENOUS

## 2012-05-21 MED ORDER — OXYCODONE HCL 5 MG PO TABS: 5.0000 mg | ORAL_TABLET | Freq: Once | ORAL | Status: DC | PRN

## 2012-05-21 MED ORDER — LOSARTAN POTASSIUM-HCTZ 100-25 MG PO TABS: 1.0000 | ORAL_TABLET | Freq: Every day | ORAL | Status: DC

## 2012-05-21 MED ORDER — FENTANYL CITRATE 0.05 MG/ML IJ SOLN: 50.0000 ug | Freq: Once | INTRAMUSCULAR | Status: DC

## 2012-05-21 MED ORDER — SODIUM CHLORIDE 0.9 % IR SOLN
Status: DC | PRN
  Administered 2012-05-21: 1000 mL

## 2012-05-21 MED ORDER — HYDROCHLOROTHIAZIDE 25 MG PO TABS
25.0000 mg | ORAL_TABLET | Freq: Every day | ORAL | Status: DC
  Administered 2012-05-21 – 2012-05-23 (×3): 25 mg via ORAL
  Filled 2012-05-21 (×5): qty 1

## 2012-05-21 MED ORDER — DIPHENHYDRAMINE HCL 50 MG/ML IJ SOLN: 12.5000 mg | Freq: Four times a day (QID) | INTRAMUSCULAR | Status: DC | PRN

## 2012-05-21 MED ORDER — ACETAMINOPHEN 10 MG/ML IV SOLN
1000.0000 mg | Freq: Once | INTRAVENOUS | Status: AC
  Administered 2012-05-21: 1000 mg via INTRAVENOUS

## 2012-05-21 MED ORDER — INFLUENZA VIRUS VACC SPLIT PF IM SUSP
0.5000 mL | INTRAMUSCULAR | Status: AC
  Administered 2012-05-23: 0.5 mL via INTRAMUSCULAR
  Filled 2012-05-21: qty 0.5

## 2012-05-21 MED ORDER — BUPIVACAINE ON-Q PAIN PUMP (FOR ORDER SET NO CHG)
INJECTION | Status: DC
  Filled 2012-05-21: qty 1

## 2012-05-21 MED ORDER — BUPIVACAINE 0.25 % ON-Q PUMP DUAL CATH 300 ML
INJECTION | Status: DC | PRN
  Administered 2012-05-21: 300 mL

## 2012-05-21 MED ORDER — ARTIFICIAL TEARS OP OINT
TOPICAL_OINTMENT | OPHTHALMIC | Status: DC | PRN
  Administered 2012-05-21: 1 via OPHTHALMIC

## 2012-05-21 MED ORDER — LACTATED RINGERS IV SOLN
INTRAVENOUS | Status: DC | PRN
  Administered 2012-05-21 (×2): via INTRAVENOUS

## 2012-05-21 MED ORDER — DIPHENHYDRAMINE HCL 12.5 MG/5ML PO ELIX
12.5000 mg | ORAL_SOLUTION | Freq: Four times a day (QID) | ORAL | Status: DC | PRN
  Filled 2012-05-21: qty 5

## 2012-05-21 MED ORDER — GLYCOPYRROLATE 0.2 MG/ML IJ SOLN
INTRAMUSCULAR | Status: DC | PRN
  Administered 2012-05-21: 0.6 mg via INTRAVENOUS

## 2012-05-21 MED ORDER — KCL IN DEXTROSE-NACL 20-5-0.45 MEQ/L-%-% IV SOLN
INTRAVENOUS | Status: DC
  Administered 2012-05-21 – 2012-05-22 (×3): via INTRAVENOUS
  Filled 2012-05-21 (×4): qty 1000

## 2012-05-21 MED ORDER — NALOXONE HCL 0.4 MG/ML IJ SOLN: 0.4000 mg | INTRAMUSCULAR | Status: DC | PRN

## 2012-05-21 MED ORDER — METOPROLOL SUCCINATE ER 50 MG PO TB24
50.0000 mg | ORAL_TABLET | Freq: Every day | ORAL | Status: DC
  Administered 2012-05-21: 50 mg via ORAL
  Filled 2012-05-21 (×2): qty 1

## 2012-05-21 MED ORDER — ACETAMINOPHEN 10 MG/ML IV SOLN
INTRAVENOUS | Status: AC
  Filled 2012-05-21: qty 100

## 2012-05-21 MED ORDER — MORPHINE SULFATE (PF) 1 MG/ML IV SOLN
INTRAVENOUS | Status: DC
  Administered 2012-05-21: 11:00:00 via INTRAVENOUS

## 2012-05-21 MED ORDER — FELODIPINE ER 5 MG PO TB24
5.0000 mg | ORAL_TABLET | Freq: Every day | ORAL | Status: DC
  Administered 2012-05-21 – 2012-05-23 (×3): 5 mg via ORAL
  Filled 2012-05-21 (×5): qty 1

## 2012-05-21 MED ORDER — ONDANSETRON HCL 4 MG/2ML IJ SOLN: 4.0000 mg | Freq: Four times a day (QID) | INTRAMUSCULAR | Status: DC | PRN

## 2012-05-21 MED ORDER — METOPROLOL SUCCINATE ER 50 MG PO TB24
50.0000 mg | ORAL_TABLET | Freq: Every day | ORAL | Status: DC
  Administered 2012-05-21 – 2012-05-23 (×3): 50 mg via ORAL
  Filled 2012-05-21 (×4): qty 1

## 2012-05-21 MED ORDER — MIDAZOLAM HCL 2 MG/2ML IJ SOLN: 1.0000 mg | INTRAMUSCULAR | Status: DC | PRN

## 2012-05-21 MED ORDER — OXYCODONE HCL 5 MG/5ML PO SOLN: 5.0000 mg | Freq: Once | ORAL | Status: DC | PRN

## 2012-05-21 MED ORDER — SODIUM CHLORIDE 0.9 % IJ SOLN: 9.0000 mL | INTRAMUSCULAR | Status: DC | PRN

## 2012-05-21 MED ORDER — MELOXICAM 7.5 MG PO TABS
7.5000 mg | ORAL_TABLET | Freq: Every day | ORAL | Status: DC | PRN
  Administered 2012-05-21: 7.5 mg via ORAL
  Filled 2012-05-21: qty 1

## 2012-05-21 SURGICAL SUPPLY — 55 items
ADH SKN CLS APL DERMABOND .7 (GAUZE/BANDAGES/DRESSINGS) ×1
BINDER ABD UNIV 12 30-45 (MISCELLANEOUS) IMPLANT
BINDER ABDOMINAL 12 (MISCELLANEOUS)
BLADE SURG ROTATE 9660 (MISCELLANEOUS) ×1 IMPLANT
CANISTER SUCTION 2500CC (MISCELLANEOUS) ×1 IMPLANT
CATH KIT ON Q 5IN SLV (PAIN MANAGEMENT) ×2 IMPLANT
CHLORAPREP W/TINT 26ML (MISCELLANEOUS) ×2 IMPLANT
CLOTH BEACON ORANGE TIMEOUT ST (SAFETY) ×2 IMPLANT
CLSR STERI-STRIP ANTIMIC 1/2X4 (GAUZE/BANDAGES/DRESSINGS) ×1 IMPLANT
COVER SURGICAL LIGHT HANDLE (MISCELLANEOUS) ×2 IMPLANT
DECANTER SPIKE VIAL GLASS SM (MISCELLANEOUS) ×2 IMPLANT
DERMABOND ADVANCED (GAUZE/BANDAGES/DRESSINGS) ×1
DERMABOND ADVANCED .7 DNX12 (GAUZE/BANDAGES/DRESSINGS) ×1 IMPLANT
DEVICE SECURE STRAP 25 ABSORB (INSTRUMENTS) ×1 IMPLANT
DEVICE TROCAR PUNCTURE CLOSURE (ENDOMECHANICALS) ×1 IMPLANT
DRAPE UTILITY 15X26 W/TAPE STR (DRAPE) ×4 IMPLANT
DRAPE WARM FLUID 44X44 (DRAPE) ×1 IMPLANT
DRSG TEGADERM 4X4.75 (GAUZE/BANDAGES/DRESSINGS) ×1 IMPLANT
ELECT REM PT RETURN 9FT ADLT (ELECTROSURGICAL) ×2
ELECTRODE REM PT RTRN 9FT ADLT (ELECTROSURGICAL) ×1 IMPLANT
GAUZE SPONGE 2X2 8PLY STRL LF (GAUZE/BANDAGES/DRESSINGS) IMPLANT
GLOVE BIO SURGEON STRL SZ 6.5 (GLOVE) ×1 IMPLANT
GLOVE BIOGEL M STRL SZ7.5 (GLOVE) ×2 IMPLANT
GLOVE BIOGEL PI IND STRL 8 (GLOVE) ×1 IMPLANT
GLOVE BIOGEL PI INDICATOR 8 (GLOVE) ×2
GLOVE ECLIPSE 8.0 STRL XLNG CF (GLOVE) ×1 IMPLANT
GOWN PREVENTION PLUS XXLARGE (GOWN DISPOSABLE) ×2 IMPLANT
GOWN STRL NON-REIN LRG LVL3 (GOWN DISPOSABLE) ×6 IMPLANT
KIT BASIN OR (CUSTOM PROCEDURE TRAY) ×2 IMPLANT
KIT ROOM TURNOVER OR (KITS) ×2 IMPLANT
MESH VENTRALIGHT ST 6X8 (Mesh Specialty) ×2 IMPLANT
MESH VENTRLGHT ELLIPSE 8X6XMFL (Mesh Specialty) IMPLANT
NDL SPNL 22GX3.5 QUINCKE BK (NEEDLE) ×1 IMPLANT
NEEDLE SPNL 22GX3.5 QUINCKE BK (NEEDLE) ×2 IMPLANT
NS IRRIG 1000ML POUR BTL (IV SOLUTION) ×3 IMPLANT
PAD ARMBOARD 7.5X6 YLW CONV (MISCELLANEOUS) ×4 IMPLANT
PEN SKIN MARKING BROAD (MISCELLANEOUS) ×2 IMPLANT
SCISSORS LAP 5X35 DISP (ENDOMECHANICALS) ×1 IMPLANT
SET IRRIG TUBING LAPAROSCOPIC (IRRIGATION / IRRIGATOR) ×1 IMPLANT
SLEEVE ENDOPATH XCEL 5M (ENDOMECHANICALS) ×2 IMPLANT
SPONGE GAUZE 2X2 STER 10/PKG (GAUZE/BANDAGES/DRESSINGS) ×1
SUT GORETEX 48 CV 2 THX 26 (SUTURE) IMPLANT
SUT MNCRL AB 4-0 PS2 18 (SUTURE) ×2 IMPLANT
SUT NOVA NAB DX-16 0-1 5-0 T12 (SUTURE) ×3 IMPLANT
SUT PROLENE 1 CT (SUTURE) IMPLANT
SUT VIC AB 3-0 SH 27 (SUTURE)
SUT VIC AB 3-0 SH 27XBRD (SUTURE) IMPLANT
TOWEL OR 17X24 6PK STRL BLUE (TOWEL DISPOSABLE) ×4 IMPLANT
TOWEL OR 17X26 10 PK STRL BLUE (TOWEL DISPOSABLE) ×2 IMPLANT
TRAY FOLEY CATH 14FR (SET/KITS/TRAYS/PACK) ×1 IMPLANT
TRAY LAPAROSCOPIC (CUSTOM PROCEDURE TRAY) ×2 IMPLANT
TROCAR XCEL BLUNT TIP 100MML (ENDOMECHANICALS) IMPLANT
TROCAR XCEL NON-BLD 11X100MML (ENDOMECHANICALS) ×2 IMPLANT
TROCAR XCEL NON-BLD 5MMX100MML (ENDOMECHANICALS) ×2 IMPLANT
TUNNELER SHEATH ON-Q 16GX12 DP (PAIN MANAGEMENT) ×1 IMPLANT

## 2012-05-21 NOTE — Op Note (Signed)
NAMEMarland Kitchen  [name], [name] NO.:  [id number]  MEDICAL RECORD NO.:  [id number]  LOCATION:  6N19C                        FACILITY:  [facility]  PHYSICIAN:  [name]. Andrey Campanile, MD, FACSDATE OF BIRTH:  Jul 30, 1928  DATE OF PROCEDURE:  05/21/2012 DATE OF DISCHARGE:                              OPERATIVE REPORT   PREOPERATIVE DIAGNOSIS:  Left lower quadrant hernia.  POSTOPERATIVE DIAGNOSIS:  Left lower quadrant incisional hernia.  PROCEDURE:  Laparoscopic repair of ventral incisional hernia with mesh.  SURGEON:  [name]. Andrey Campanile, MD, FACS  ASSISTANT SURGEON:  Ardeth Sportsman, MD, FACS.  ANESTHESIA:  General.  ESTIMATED BLOOD LOSS:  Minimal.  SPECIMEN:  None.  FINDINGS:  The patient had a low left lower quadrant hernia.  It was lateral to the epigastrics almost as if it was an indirect hernia, but it was not a true inguinal hernia.  She had a prior Pfannenstiel incision and the defect was at the most lateral aspect of that incision on the left.  The defect was approximately 2.5 to 3 cm in width.  A large piece of Bard Ventralight ST mesh was placed 15 cm x 25 cm beneath the fascia, but external to the peritoneal cavity.  INDICATIONS FOR PROCEDURE:  The patient is a very pleasant 78 year old female who I initially met in September who had been referred by her primary care doctor for a bulge that she did notice for the past 3 months.  It was causing pain in her abdomen.  She had a CT scan which demonstrated findings consistent or suggestive a left hernia.  We discussed the risks and benefits of surgery including, but not limited to, bleeding, infection, injury to surrounding structures, hematoma formation, seroma formation, mesh complications, hernia recurrence, blood clot formation, need to convert to an open procedure, chronic abdominal wall pain, anesthesia risks, prolonged hospitalization, ileus, pneumonia, cardiac complications as well as the typical postoperative course.   She elected to proceed to surgery.  DESCRIPTION OF PROCEDURE:  After obtaining informed consent, the patient was taken to the operating room #8 at [facility].  She was placed supine on the operating room table.  General endotracheal anesthesia was established.  Sequential compression devices were placed. The Foley catheter was placed.  Her abdomen was prepped and draped in usual standard surgical fashion with ChloraPrep.  She received IV Tylenol and vancomycin prior to skin incision.  Surgical time-out was performed.  Elected to gain entry to her abdominal cavity in the left upper quadrant using an Optiview technique.  Two fingerbreadths below the left subcostal margin, a small 0.5 cm incision was made with a #11 blade. The 5 mm 0 degree scope was placed through the 5 mm trocar and advanced through all layers of the abdominal wall under direct visualization. The abdominal cavity was easily entered.  Pneumoperitoneum was smoothly established up to patient pressure of 15 mmHg.  The laparoscope was advanced and the abdominal cavity was surveilled.  There was no evidence of injury to surrounding structures.  An 11-mm trocar was placed in the right upper quadrant just to the right the midline and then a 5-mm trocar was placed in the right upper quadrant  laterally.  All trocars were placed under direct visualization.  The laparoscope was placed in the right upper quadrant and the left lower quadrant was surveilled.  The hernia was identified.  It was in the left lower quadrant near the groin.  She had little bit of scar tissue in her right lower quadrant, but we left that alone.  We elected to treat this as almost inguinal hernia and therefore we took down the peritoneum starting medially just below the level of the umbilicus and carrying it laterally about 5 inches above the anterior spine on the left.  The peritoneal flap was dissected downward from the abdominal wall.  Laterally,  there was a small portion where the peritoneal flap was just intimately associated with a thin layer of muscle.  I ended up bringing the thin layer of muscle down. This was very lateral.  I ended up, eventually able to free the muscle from the peritoneal flap.  The inferior epigastric vessel was identified and preserved.  Then, we carried our dissection medially and inferiorly. The pubic bone was identified and the peritoneal flap was dissected downward.  At this point we started reducing the hernia using traction and counter traction.  There was a large plug of fat within the hernia. We were able to reduce the hernia in its entirety.  We then went about creating a large pocket to accommodate the mesh by bringing up the peritoneum over the lower abdominal structures and off the iliac vessels taking care not to injure the iliac vessel.  The defect was lateral to the inferior epigastric vessels on the left.  It almost appeared as stated above.  It almost appeared like an indirect hernia but the orientation was different and atypical for a true left indirect inguinal hernia and wound corresponded to the defect onto the patient's abdominal wall.  It appeared to be at the most lateral aspect of her Pfannenstiel incision.  I obtained a piece of Bard Ventralight ST mesh 15 cm x x 20 cm.  I placed 4 sutures along the superior, medial and lateral aspect of the mesh with #1 Novafil sutures and tied it down to the mesh. A slit was made in the mesh at the other end.  The mesh was then rolled like a cigar and placed in the abdominal cavity into the left groin.  It was uncurled with the appropriate side facing the abdominal wall with the smooth side facing the peritoneal flap.  The mesh was unrolled and oriented in the appropriate fashion.  The transfascial sutures essentially covered the left lower quadrant in the midline just below the umbilicus and then in the left mid abdomen.  The mesh was oriented  so that the defect was well covered and there was a large flap of mesh going into the pelvis overlying the pubic tubercle in the obturator space as well as where  direct hernia may occur.  The mesh was then tacked to the [facility] ligament with a secure strap Tacker and then 2 additional tacks were placed medially along the pubic bone and then superior medially. At this point I then made small nicks in the skin and brought up the 4 transfascial sutures in a typical fashion. the mesh was smooth. There was very little to no redundancy.  The defect was well covered. There was at least approximately 8 cm overlap.  The transfascial sutures were tied down and secured and excess suture was trimmed.  At this point we brought the peritoneum back  over to cover the mesh to close the peritoneal flap.  This was done with 6-7 secure strap tacks.  The mesh was well covered.  There was one small area of approximately 1 cm where there was a small gap in the coverage of the mesh.  However, there was a little redundancy in the flap and it was brought up to cover the mesh and tacked to the abdominal wall.  At this point two 5-inch On-Q pain catheters were placed one going to the left laterally and one going medially down into the pelvis and these were passed into the preperitoneal space and viewed laparoscopically as they were being placed.  Pneumoperitoneum was released.  Remaining trocars were removed. The 11-mm trocar site was closed with an interrupted 0 Vicryl.  Then the trocar sites were closed with a 4-0 Monocryl in subcuticular fashion followed by Steri-Strips.  The On-Q catheters were placed into the tunneled catheters and sheaths were pulled away and On-Q catheters were adhered to the abdominal wall skin with Steri-Strips.  Then occlusive dressings were placed.  The small nicks that had been made for the 4 transitional sites had been covered with Steri-Strips.  The patient was extubated and taken to  the recovery room.  There were no immediate complications.  The patient tolerated the procedure well.  All instrument and sponge counts were correct x2.     [name]. Andrey Campanile, MD, FACS     EMW/MEDQ  D:  05/21/2012  T:  05/21/2012  Job:  [id number]  cc:   Lonzo Cloud. Kriste Basque, MD

## 2012-05-21 NOTE — Anesthesia Procedure Notes (Signed)
Procedure Name: Intubation Date/Time: 05/21/2012 8:13 AM Performed by: Fransisca Kaufmann Pre-anesthesia Checklist: Patient identified, Timeout performed, Emergency Drugs available, Suction available and Patient being monitored Patient Re-evaluated:Patient Re-evaluated prior to inductionOxygen Delivery Method: Circle system utilized Preoxygenation: Pre-oxygenation with 100% oxygen Intubation Type: IV induction Ventilation: Mask ventilation without difficulty Laryngoscope Size: Miller and 2 Grade View: Grade I Tube type: Oral Number of attempts: 1 Airway Equipment and Method: Stylet Placement Confirmation: ETT inserted through vocal cords under direct vision,  breath sounds checked- equal and bilateral,  positive ETCO2 and CO2 detector Secured at: 21 cm Tube secured with: Tape Dental Injury: Teeth and Oropharynx as per pre-operative assessment

## 2012-05-21 NOTE — Progress Notes (Signed)
UR completed 

## 2012-05-21 NOTE — Transfer of Care (Signed)
Immediate Anesthesia Transfer of Care Note  Patient: [name]  Procedure(s) Performed: Procedure(s) (LRB) with comments: LAPAROSCOPIC VENTRAL HERNIA (N/A) INSERTION OF MESH (N/A)  Patient Location: PACU  Anesthesia Type: General  Level of Consciousness: awake, alert , oriented and sedated  Airway & Oxygen Therapy: Patient Spontanous Breathing and Patient connected to nasal cannula oxygen  Post-op Assessment: Report given to PACU RN, Post -op Vital signs reviewed and stable and Patient moving all extremities  Post vital signs: Reviewed and stable  Complications: No apparent anesthesia complications

## 2012-05-21 NOTE — Anesthesia Preprocedure Evaluation (Addendum)
Anesthesia Evaluation  Patient identified by MRN, date of birth, ID band Patient awake    Reviewed: Allergy & Precautions, H&P , NPO status , Patient's Chart, lab work & pertinent test results  Airway Mallampati: II TM Distance: >3 FB Neck ROM: Full    Dental   Pulmonary sleep apnea ,  breath sounds clear to auscultation+ rhonchi         Cardiovascular hypertension, + CAD + dysrhythmias Supra Ventricular Tachycardia Rhythm:Regular Rate:Normal     Neuro/Psych Anxiety Depression  Neuromuscular disease    GI/Hepatic Neg liver ROS, hiatal hernia, GERD-  ,  Endo/Other  negative endocrine ROS  Renal/GU negative Renal ROS     Musculoskeletal   Abdominal   Peds  Hematology   Anesthesia Other Findings   Reproductive/Obstetrics                          Anesthesia Physical Anesthesia Plan  ASA: III  Anesthesia Plan: General   Post-op Pain Management:    Induction: Intravenous  Airway Management Planned: Oral ETT  Additional Equipment:   Intra-op Plan:   Post-operative Plan: Extubation in OR  Informed Consent: I have reviewed the patients History and Physical, chart, labs and discussed the procedure including the risks, benefits and alternatives for the proposed anesthesia with the patient or authorized representative who has indicated his/her understanding and acceptance.   Dental advisory given  Plan Discussed with: CRNA, Surgeon and Anesthesiologist  Anesthesia Plan Comments:        Anesthesia Quick Evaluation

## 2012-05-21 NOTE — Brief Op Note (Signed)
05/21/2012  9:55 AM  PATIENT:  [name]  78 y.o. female  PRE-OPERATIVE DIAGNOSIS:  Left Lower quadrant hernia  POST-OPERATIVE DIAGNOSIS:  Left lower quadrant incisional hernia  PROCEDURE:  Procedure(s) (LRB) with comments: LAPAROSCOPIC VENTRAL HERNIA (N/A) INSERTION OF MESH (N/A)  SURGEON:  Surgeon(s) and Role:    * Ardeth Sportsman, MD - Assisting    * Atilano Ina, MD,FACS - Primary  PHYSICIAN ASSISTANT: none  ASSISTANTS: see above   ANESTHESIA:   general  EBL:  Total I/O In: 800 [I.V.:800] Out: 700 [Urine:700]  BLOOD ADMINISTERED:none  DRAINS: Urinary Catheter (Foley) and ON-Q pain catheter x 2   LOCAL MEDICATIONS USED:  MARCAINE     SPECIMEN:  No Specimen  DISPOSITION OF SPECIMEN:  N/A  COUNTS:  YES  TOURNIQUET:  * No tourniquets in log *  DICTATION: .Other Dictation: Dictation Number [id number]  PLAN OF CARE: Admit to inpatient   PATIENT DISPOSITION:  PACU - hemodynamically stable.   Delay start of Pharmacological VTE agent (>24hrs) due to surgical blood loss or risk of bleeding: no  Type of repair - mesh  (choices - primary suture, mesh, or component)  Name of mesh - Bard Ventralight ST  Size of mesh - Length 15 cm, Width 20 cm  Mesh overlap - at least 8 cm  Placement of mesh -  beneath fascia but external to peritoneal cavity  (choices - beneath fascia and into peritoneal cavity, beneath fascia but external to peritoneal cavity, between the muscle and fascia, above or external to fascia)  [name]. Andrey Campanile, MD, FACS General, Bariatric, & Minimally Invasive Surgery [facility], [place]

## 2012-05-21 NOTE — H&P (Signed)
[name] is an 78 y.o. female.   Chief Complaint: here for surgery HPI: This is a very pleasant 78 year old African American female referred by Dr. Kriste Basque for evaluation of a left abdominal wall bulge. The patient states that she has had intermittent discomfort for about 3 months. She states that she will get a lump on the left side of her abdominal wall. This will cause her to have pain in the pit of her stomach. The frequency of the discomfort varies. The lump has always been able to be reduced. She denies any nausea or vomiting. She reports having a bowel movement every day. When she does have the abdominal pain she'll try to drink a boost which will help with the discomfort. She has had a prior hysterectomy. She denies any weight loss. She denies any fevers or chills. She lives alone.   PMHx, PSHx, SOCHx, FAMHx, ALL reviewed   Past Medical History  Diagnosis Date  . Glaucoma(365)   . Hearing loss   . Bronchitis, mucopurulent recurrent   . Hypertension   . CAD (coronary artery disease)   . Palpitations   . Hypercholesteremia   . Thyroid nodule   . GERD (gastroesophageal reflux disease)   . Diverticulosis of colon   . Personal history of other diseases of digestive system   . Hx of adenomatous colonic polyps   . Lumbar back pain   . Vitamin D deficiency   . Abnormality of gait   . Anxiety   . Anemia   . Dysrhythmia     OCC HEART PALPITATION DR [name]  . Depression   . OSA (obstructive sleep apnea)     CPAP  MANY YRS AGO NOT SURE WHERE DONE  . Hiatal hernia   . DJD (degenerative joint disease)   . Chronic osteomyelitis, other specified site     Past Surgical History  Procedure Date  . Appendectomy   . Cholecystectomy   . Abdominal hysterectomy   . Total knee arthroplasty     left  . Total knee arthroplasty     right  . Cataract extraction     History reviewed. No pertinent family history. Social History:  reports that she has never smoked. She has never used  smokeless tobacco. She reports that she does not drink alcohol or use illicit drugs.  Allergies:  Allergies  Allergen Reactions  . Amoxicillin     REACTION: itching  . Cephalexin     REACTION: itching  . Penicillins     REACTION: itching    Medications Prior to Admission  Medication Sig Dispense Refill  . atorvastatin (LIPITOR) 10 MG tablet Take 10 mg by mouth daily.      . dorzolamide-timolol (COSOPT) 22.3-6.8 MG/ML ophthalmic solution Place 1 drop into both eyes Three times a day.      . esomeprazole (NEXIUM) 40 MG capsule Take 1 capsule (40 mg total) by mouth daily.  30 capsule  3  . felodipine (PLENDIL) 5 MG 24 hr tablet Take 5 mg by mouth daily.      [name] LORazepam (ATIVAN) 0.5 MG tablet Take 0.5 mg by mouth at bedtime.      [name] losartan-hydrochlorothiazide (HYZAAR) 100-25 MG per tablet Take 1 tablet by mouth daily.      . meloxicam (MOBIC) 7.5 MG tablet Take 7.5 mg by mouth daily as needed. For arthritis pain      . metoprolol succinate (TOPROL-XL) 50 MG 24 hr tablet Take 50 mg by mouth daily.      [name]  Multiple Vitamins-Minerals (CENTRUM SILVER ULTRA WOMENS PO) Take 1 tablet by mouth daily.      [name] venlafaxine (EFFEXOR) 37.5 MG tablet Take 37.5 mg by mouth 2 (two) times daily.        No results found for this or any previous visit (from the past 48 hour(s)). No results found.  Review of Systems  Constitutional: Negative for fever and chills.  HENT: Negative for hearing loss.   Eyes: Negative for blurred vision, double vision and discharge.  Respiratory: Negative for shortness of breath and wheezing.   Cardiovascular: Negative for chest pain, orthopnea, leg swelling and PND.  Gastrointestinal: Positive for abdominal pain. Negative for nausea, vomiting, constipation and blood in stool.  Genitourinary: Negative for dysuria and urgency.  Neurological: Negative for tingling, speech change, focal weakness, seizures and loss of consciousness.  Psychiatric/Behavioral: Negative for  substance abuse. The patient is not nervous/anxious.     Blood pressure 157/87, pulse 68, temperature 97.9 F (36.6 C), temperature source Oral, resp. rate 18, SpO2 98.00%. Physical Exam  Vitals reviewed. Constitutional: She is oriented to person, place, and time. She appears well-developed and well-nourished. No distress.  HENT:  Head: Normocephalic and atraumatic.  Right Ear: External ear normal.  Left Ear: External ear normal.  Eyes: Conjunctivae normal are normal. No scleral icterus.  Neck: Normal range of motion. Neck supple. No tracheal deviation present. No thyromegaly present.  Cardiovascular: Normal rate and normal heart sounds.   Respiratory: Effort normal and breath sounds normal. No respiratory distress. She has no wheezes.  GI: Soft. Bowel sounds are normal. She exhibits no distension. There is no tenderness.    Musculoskeletal: She exhibits no edema and no tenderness.  Lymphadenopathy:    She has no cervical adenopathy.  Neurological: She is alert and oriented to person, place, and time.  Skin: Skin is warm and dry. No rash noted. She is not diaphoretic. No erythema.  Psychiatric: She has a normal mood and affect. Her behavior is normal. Judgment and thought content normal.     Assessment/Plan Left spigelian hernia  To or for surgery. All questions asked and answered  [name]. Jenny Campanile, MD, FACS General, Bariatric, & Minimally Invasive Surgery [facility], [place]   [facility] M 05/21/2012, 7:52 AM

## 2012-05-21 NOTE — Anesthesia Postprocedure Evaluation (Signed)
  Anesthesia Post-op Note  Patient: [name]  Procedure(s) Performed: Procedure(s) (LRB) with comments: LAPAROSCOPIC VENTRAL HERNIA (N/A) INSERTION OF MESH (N/A)  Patient Location: PACU  Anesthesia Type: General  Level of Consciousness: awake  Airway and Oxygen Therapy: Patient Spontanous Breathing  Post-op Pain: mild  Post-op Assessment: Post-op Vital signs reviewed, Patient's Cardiovascular Status Stable, Respiratory Function Stable, Patent Airway, No signs of Nausea or vomiting and Pain level controlled  Post-op Vital Signs: stable  Complications: No apparent anesthesia complications

## 2012-05-21 NOTE — Preoperative (Signed)
Beta Blockers   Reason not to administer Beta Blockers:Not Applicable 

## 2012-05-22 ENCOUNTER — Encounter (HOSPITAL_COMMUNITY): Payer: Self-pay | Admitting: General Surgery

## 2012-05-22 LAB — BASIC METABOLIC PANEL
BUN: 10 mg/dL (ref 6–23)
Calcium: 9.1 mg/dL (ref 8.4–10.5)
Chloride: 105 mEq/L (ref 96–112)
Creatinine, Ser: 0.84 mg/dL (ref 0.50–1.10)
GFR calc Af Amer: 73 mL/min — ABNORMAL LOW (ref >90)

## 2012-05-22 LAB — CBC
Hemoglobin: 12.2 g/dL (ref 12.0–15.0)
MCH: 28.9 pg (ref 26.0–34.0)
MCHC: 32.1 g/dL (ref 30.0–36.0)
RDW: 14.6 % (ref 11.5–15.5)

## 2012-05-22 MED ORDER — OXYCODONE-ACETAMINOPHEN 5-325 MG PO TABS: 1.0000 | ORAL_TABLET | ORAL | Status: DC | PRN

## 2012-05-22 MED ORDER — POTASSIUM CHLORIDE CRYS ER 20 MEQ PO TBCR
40.0000 meq | EXTENDED_RELEASE_TABLET | Freq: Once | ORAL | Status: DC
  Filled 2012-05-22 (×2): qty 2

## 2012-05-22 MED ORDER — MORPHINE SULFATE 2 MG/ML IJ SOLN: 1.0000 mg | INTRAMUSCULAR | Status: DC | PRN

## 2012-05-22 MED ORDER — BOOST PLUS PO LIQD
237.0000 mL | Freq: Three times a day (TID) | ORAL | Status: DC
  Filled 2012-05-22 (×6): qty 237

## 2012-05-22 NOTE — Evaluation (Signed)
Physical Therapy Evaluation Patient Details Name: [name] MRN: [id number] DOB: 15-Nov-1927 Today's Date: 05/22/2012 Time: 5784-6962 PT Time Calculation (min): 21 min  PT Assessment / Plan / Recommendation Clinical Impression  78 yo adm for ventral hernia repair. Pt mobilizing well and will have her niece stay with her to provide 24 hr care on d/c. No PT needs identified.    PT Assessment  Patent does not need any further PT services    Follow Up Recommendations  No PT follow up;Supervision/Assistance - 24 hour    Does the patient have the potential to tolerate intense rehabilitation      Barriers to Discharge        Equipment Recommendations  None recommended by PT    Recommendations for Other Services     Frequency      Precautions / Restrictions     Pertinent Vitals/Pain Denies pain in abd; "just a little tender"      Mobility  Bed Mobility Bed Mobility: Supine to Sit;Sitting - Scoot to Edge of Bed Supine to Sit: 6: Modified independent (Device/Increase time);HOB elevated Sitting - Scoot to Edge of Bed: 7: Independent Transfers Transfers: Sit to Stand;Stand to Sit Sit to Stand: 6: Modified independent (Device/Increase time);With upper extremity assist;From bed Stand to Sit: 6: Modified independent (Device/Increase time);With upper extremity assist;With armrests;To chair/3-in-1 Ambulation/Gait Ambulation/Gait Assistance: 4: Min guard Ambulation Distance (Feet): 200 Feet Assistive device: 1 person hand held assist (to simulate use of cane) Ambulation/Gait Assistance Details: Pt with slow cadence, wider base of support, and when not simulating use of cane, she reaches for railing to hold onto Gait Pattern: Step-through pattern;Decreased stride length;Wide base of support Gait velocity: decr    Shoulder Instructions     Exercises     PT Diagnosis:    PT Problem List:   PT Treatment Interventions:     PT Goals    Visit Information  Last PT Received  On: 05/22/12 Assistance Needed: +1    Subjective Data  Subjective: Pt reports she normally uses a cane wherever she walks Patient Stated Goal: return home with her niece to assist her   Prior Functioning  Home Living Lives With: Alone Available Help at Discharge: Family;Available 24 hours/day (niece) Type of Home: House Home Access: Stairs to enter [employer] of Steps: 2 Entrance Stairs-Rails: None (holds onto screen door) Home Layout: One level Bathroom Shower/Tub: Forensic scientist: Standard Bathroom Accessibility: Yes How Accessible: Accessible via walker Home Adaptive Equipment: Shower chair with back;Bedside commode/3-in-1;Straight cane;Wheelchair - manual Prior Function Level of Independence: Independent Able to Take Stairs?: Yes Driving: Yes Communication Communication: No difficulties    Cognition  Overall Cognitive Status: Appears within functional limits for tasks assessed/performed Orientation Level: Oriented X4 / Intact Behavior During Session: [facility] for tasks performed    Extremity/Trunk Assessment Right Lower Extremity Assessment RLE ROM/Strength/Tone: [facility] for tasks assessed Left Lower Extremity Assessment LLE ROM/Strength/Tone: [facility] for tasks assessed   Balance    End of Session PT - End of Session Activity Tolerance: Patient tolerated treatment well Patient left: in chair;with call bell/phone within reach;with family/visitor present  GP     Hatcher Froning 05/22/2012, 3:12 PM  Pager [phone]

## 2012-05-22 NOTE — Progress Notes (Signed)
1 Day Post-Op  Subjective: Doing well - doesn't c/o much pain. No n/v. Tolerating liquids. No BM.  Objective: Vital signs in last 24 hours: Temp:  [97 F (36.1 C)-99 F (37.2 C)] 98.3 F (36.8 C) (10/18 0950) Pulse Rate:  [53-96] 60  (10/18 0950) Resp:  [10-24] 17  (10/18 0950) BP: (112-185)/(53-81) 112/71 mmHg (10/18 0950) SpO2:  [95 %-100 %] 98 % (10/18 0950) Weight:  [140 lb (63.504 kg)] 140 lb (63.504 kg) (10/17 1547) Last BM Date: 05/20/12  Intake/Output from previous day: 10/17 0701 - 10/18 0700 In: 2517.5 [P.O.:120; I.V.:2397.5] Out: 3205 [Urine:3200; Blood:5] Intake/Output this shift: Total I/O In: -  Out: 450 [Urine:450]  Alert, nad cta b/l Reg Soft, expected mild TTP. Dressing c/d/i. On-q intact.  No edema   Lab Results:   Basename 05/22/12 0545  WBC 6.0  HGB 12.2  HCT 38.0  PLT 252   BMET  Basename 05/22/12 0545  NA 138  K 3.2*  CL 105  CO2 28  GLUCOSE 107*  BUN 10  CREATININE 0.84  CALCIUM 9.1   PT/INR No results found for this basename: LABPROT:2,INR:2 in the last 72 hours ABG No results found for this basename: PHART:2,PCO2:2,PO2:2,HCO3:2 in the last 72 hours  Studies/Results: No results found.  Anti-infectives: Anti-infectives     Start     Dose/Rate Route Frequency Ordered Stop   05/20/12 1413   vancomycin (VANCOCIN) IVPB 1000 mg/200 mL premix        1,000 mg 200 mL/hr over 60 Minutes Intravenous 120 min pre-op 05/20/12 1413 05/21/12 0815          Assessment/Plan: s/p Procedure(s) (LRB) with comments: LAPAROSCOPIC VENTRAL HERNIA (N/A) INSERTION OF MESH (N/A)  Foley out already Ambulate Pt/ot Adv diet D/c pca  [name]. Jenny Campanile, MD, FACS General, Bariatric, & Minimally Invasive Surgery [facility], [place]   LOS: 1 day    [name] 05/22/2012

## 2012-05-22 NOTE — Progress Notes (Signed)
Pt. Denies pain, no usage of the pca pump. Compliant with CPAP. No meds needed for pain.

## 2012-05-22 NOTE — Progress Notes (Signed)
INITIAL ADULT NUTRITION ASSESSMENT Date: 05/22/2012   Time: 1:47 PM Reason for Assessment: MST  ASSESSMENT: Female 78 y.o.  Dx: abdominal pain  Hx:  Past Medical History  Diagnosis Date  . Glaucoma(365)   . Hearing loss   . Bronchitis, mucopurulent recurrent   . Hypertension   . CAD (coronary artery disease)   . Palpitations   . Hypercholesteremia   . Thyroid nodule   . GERD (gastroesophageal reflux disease)   . Diverticulosis of colon   . Personal history of other diseases of digestive system   . Hx of adenomatous colonic polyps   . Lumbar back pain   . Vitamin D deficiency   . Abnormality of gait   . Anxiety   . Anemia   . Dysrhythmia     OCC HEART PALPITATION DR [name]  . Depression   . OSA (obstructive sleep apnea)     CPAP  MANY YRS AGO NOT SURE WHERE DONE  . Hiatal hernia   . DJD (degenerative joint disease)   . Chronic osteomyelitis, other specified site    Past Surgical History  Procedure Date  . Appendectomy   . Cholecystectomy   . Abdominal hysterectomy   . Total knee arthroplasty     left  . Total knee arthroplasty     right  . Cataract extraction   . Inguinal hernia repair 05/21/2012    WITH MESH     Related Meds:  Scheduled Meds:   . acetaminophen  1,000 mg Intravenous Q6H  . atorvastatin  10 mg Oral QPC supper  . dorzolamide-timolol  1 drop Both Eyes Daily  . enoxaparin  40 mg Subcutaneous Q24H  . felodipine  5 mg Oral Daily  . hydrochlorothiazide  25 mg Oral Daily  . influenza  inactive virus vaccine  0.5 mL Intramuscular Tomorrow-1000  . losartan  100 mg Oral Daily  . metoprolol succinate  50 mg Oral Daily  . morphine      . pantoprazole (PROTONIX) IV  40 mg Intravenous Q24H  . potassium chloride  40 mEq Oral Once  . venlafaxine  37.5 mg Oral BID  . DISCONTD: morphine   Intravenous Q4H   Continuous Infusions:   . bupivacaine 0.25 % ON-Q pump DUAL CATH 300 mL    . bupivacaine ON-Q pain pump    . dextrose 5 % and 0.45 % NaCl  with KCl 20 mEq/L 75 mL/hr at 05/22/12 0323   PRN Meds:.diphenhydrAMINE, diphenhydrAMINE, meloxicam, morphine injection, naloxone, ondansetron (ZOFRAN) IV, oxyCODONE-acetaminophen, sodium chloride   Ht: 5\' 5"  (165.1 cm)  Wt: 140 lb (63.504 kg)  Ideal Wt: 56.8 kg % Ideal Wt: 111%  Usual Wt: 180 lbs per pt report % Usual Wt: 77%  Body mass index is 23.30 kg/(m^2).  Food/Nutrition Related Hx: decreased appetite, abdominal pain PTA, Boost BID at home  Labs:  CMP     Component Value Date/Time   NA 138 05/22/2012 0545   K 3.2* 05/22/2012 0545   CL 105 05/22/2012 0545   CO2 28 05/22/2012 0545   GLUCOSE 107* 05/22/2012 0545   BUN 10 05/22/2012 0545   CREATININE 0.84 05/22/2012 0545   CALCIUM 9.1 05/22/2012 0545   PROT 7.6 05/13/2012 1445   ALBUMIN 3.5 05/13/2012 1445   AST 23 05/13/2012 1445   ALT 12 05/13/2012 1445   ALKPHOS 60 05/13/2012 1445   BILITOT 0.7 05/13/2012 1445   GFRNONAA 63* 05/22/2012 0545   GFRAA 73* 05/22/2012 0545    CBC  Component Value Date/Time   WBC 6.0 05/22/2012 0545   RBC 4.22 05/22/2012 0545   HGB 12.2 05/22/2012 0545   HCT 38.0 05/22/2012 0545   PLT 252 05/22/2012 0545   MCV 90.0 05/22/2012 0545   MCH 28.9 05/22/2012 0545   MCHC 32.1 05/22/2012 0545   RDW 14.6 05/22/2012 0545   LYMPHSABS 1.7 05/13/2012 1445   MONOABS 1.3* 05/13/2012 1445   EOSABS 0.1 05/13/2012 1445   BASOSABS 0.0 05/13/2012 1445    Intake: sips Output:   Intake/Output Summary (Last 24 hours) at 05/22/12 1351 Last data filed at 05/22/12 0900  Gross per 24 hour  Intake 1417.5 ml  Output   2300 ml  Net -882.5 ml   No BM since admission  Diet Order: Full Liquid  Supplements/Tube Feeding: none at this time  IVF:    bupivacaine 0.25 % ON-Q pump DUAL CATH 300 mL   bupivacaine ON-Q pain pump   dextrose 5 % and 0.45 % NaCl with KCl 20 mEq/L Last Rate: 75 mL/hr at 05/22/12 0323    Estimated Nutritional Needs:   Kcal: 1560-1780 Protein: 63-76g Fluid: ~1.9  L/day  Pt admitted for evaluation of left abdominal wall bulge.  Pt s/p hernia repair (10/17).   Pt with progressive wt decline over the past 2 years.  In 2 years she has lost 40 lbs; she has lost 23 lbs (14% of her usual wt) in the past 6 months.  Pt states she has been eating per her usual, but when answering additional questions about intake states 'I just haven't had an appetite for food like I used to.'  She does endorse drinking 2 Boost beverages/day.  She reports when her stomach is upset, she can drink a Boost and it will satisfy.  Having some intermittent nausea, none recently per her report.   Pt wt loss is very concerning.  She continues to lose wt and wt loss has accelerated recently.   She lives at home alone but reports several support people in her life. She reports hunger, and is able to name several foods she would like to be eating right now. Pt is a reliable historian, but details such as those required for food recall are difficult to ascertain.   RD concerned for pt with accelerated wt loss with continued PO intake.  Friend at bedside unable to offer additional details re: nutrition hx.    Pt qualifies for non-severe malnutrition of chronic illness based on severe wt loss of 14% in 6 months, as well as mild depletion of body fat and muscle mass seen at clavicles and bicep.  NUTRITION DIAGNOSIS: Unintended wt loss r/t unknown etiology; decreased appetite AEB pt with 40 lbs wt loss  MONITORING/EVALUATION(Goals): 1.  Food/Beverage; diet advancement with pt consuming >/=50% of meals and supplements  EDUCATION NEEDS: -Education needs addressed  INTERVENTION: 1.  Supplement; Boost BID 2.  Modify diet; pt anticipating diet advancement to solid foods per MD discretion  DOCUMENTATION CODES Per approved criteria  -Non-severe (moderate) malnutrition in the context of chronic illness    Hoyt Koch 05/22/2012, 1:47 PM

## 2012-05-23 LAB — CBC WITH DIFFERENTIAL/PLATELET
Hemoglobin: 12 g/dL (ref 12.0–15.0)
Lymphocytes Relative: 22 % (ref 12–46)
Lymphs Abs: 1.4 10*3/uL (ref 0.7–4.0)
MCH: 28.7 pg (ref 26.0–34.0)
Monocytes Relative: 16 % — ABNORMAL HIGH (ref 3–12)
Neutro Abs: 3.7 10*3/uL (ref 1.7–7.7)
Neutrophils Relative %: 60 % (ref 43–77)
Platelets: 262 10*3/uL (ref 150–400)
RBC: 4.18 MIL/uL (ref 3.87–5.11)
WBC: 6.2 10*3/uL (ref 4.0–10.5)

## 2012-05-23 LAB — BASIC METABOLIC PANEL
CO2: 30 mEq/L (ref 19–32)
Calcium: 9.4 mg/dL (ref 8.4–10.5)
GFR calc non Af Amer: 64 mL/min — ABNORMAL LOW (ref >90)
Glucose, Bld: 111 mg/dL — ABNORMAL HIGH (ref 70–99)
Potassium: 3.4 mEq/L — ABNORMAL LOW (ref 3.5–5.1)
Sodium: 140 mEq/L (ref 135–145)

## 2012-05-23 MED ORDER — POTASSIUM CHLORIDE CRYS ER 20 MEQ PO TBCR
60.0000 meq | EXTENDED_RELEASE_TABLET | Freq: Once | ORAL | Status: AC
  Administered 2012-05-23: 60 meq via ORAL
  Filled 2012-05-23: qty 3

## 2012-05-23 MED ORDER — OXYCODONE-ACETAMINOPHEN 5-325 MG PO TABS: 1.0000 | ORAL_TABLET | ORAL | Status: DC | PRN

## 2012-05-23 NOTE — Progress Notes (Signed)
2 Days Post-Op  Subjective: Up in chair packed and ready to go.  She saw Dr. Andrey Campanile earlier this AM  Objective: Vital signs in last 24 hours: Temp:  [97.4 F (36.3 C)-98.1 F (36.7 C)] 97.4 F (36.3 C) (10/19 0542) Pulse Rate:  [57-87] 75  (10/19 0542) Resp:  [18-20] 18  (10/19 0542) BP: (112-159)/(51-69) 159/69 mmHg (10/19 0542) SpO2:  [94 %-99 %] 96 % (10/19 0542) Last BM Date: 05/20/12  Diet: low Na, 960 PO yesterday recorded. Afebrile VSS, K+ 3.4, WBC normal  Intake/Output from previous day: 10/18 0701 - 10/19 0700 In: 2610 [P.O.:960; I.V.:1650] Out: 450 [Urine:450] Intake/Output this shift:    {physical exam: deferred done earlier by Dr. Andrey Campanile  Lab Results:   Basename 05/23/12 0645 05/22/12 0545  WBC 6.2 6.0  HGB 12.0 12.2  HCT 37.4 38.0  PLT 262 252    BMET  Basename 05/23/12 0645 05/22/12 0545  NA 140 138  K 3.4* 3.2*  CL 104 105  CO2 30 28  GLUCOSE 111* 107*  BUN 9 10  CREATININE 0.83 0.84  CALCIUM 9.4 9.1   PT/INR No results found for this basename: LABPROT:2,INR:2 in the last 72 hours  No results found for this basename: AST:5,ALT:5,ALKPHOS:5,BILITOT:5,PROT:5,ALBUMIN:5 in the last 168 hours   Lipase  No results found for this basename: lipase     Studies/Results: No results found.  Medications:    . atorvastatin  10 mg Oral QPC supper  . dorzolamide-timolol  1 drop Both Eyes Daily  . enoxaparin  40 mg Subcutaneous Q24H  . felodipine  5 mg Oral Daily  . hydrochlorothiazide  25 mg Oral Daily  . influenza  inactive virus vaccine  0.5 mL Intramuscular Tomorrow-1000  . lactose free nutrition  237 mL Oral TID WC  . losartan  100 mg Oral Daily  . metoprolol succinate  50 mg Oral Daily  . pantoprazole (PROTONIX) IV  40 mg Intravenous Q24H  . potassium chloride  40 mEq Oral Once  . potassium chloride  60 mEq Oral Once  . venlafaxine  37.5 mg Oral BID  . DISCONTD: morphine   Intravenous Q4H    Assessment/Plan Left lower quadrant  incisional hernia. S/p Laparoscopic repair of ventral incisional hernia with mesh.  Patient Active Problem List  Diagnosis  . THYROID NODULE  . VITAMIN D DEFICIENCY  . HYPERCHOLESTEROLEMIA  . ANEMIA  . ANXIETY  . OBSTRUCTIVE SLEEP APNEA  . GLAUCOMA  . HEARING LOSS  . HYPERTENSION  . CAD  . BRONCHITIS, RECURRENT  . GERD  . HIATAL HERNIA  . DIVERTICULOSIS OF COLON  . DEGENERATIVE JOINT DISEASE  . BACK PAIN, LUMBAR  . CHRONIC OSTEOMYELITIS OTHER SPECIFIED SITES  . ABNORMALITY OF GAIT  . PALPITATIONS  . COLONIC POLYPS, ADENOMATOUS, HX OF  . DIVERTICULAR BLEEDING, HX OF  . Spigelian hernia  . Weight loss   Plan:  Home today.         LOS: 2 days    Lancer Thurner 05/23/2012

## 2012-05-25 DIAGNOSIS — Z9989 Dependence on other enabling machines and devices: Secondary | ICD-10-CM | POA: Diagnosis present

## 2012-05-25 NOTE — Progress Notes (Signed)
[name] M. Jenny Detter, MD, FACS General, Bariatric, & Minimally Invasive Surgery [facility]  

## 2012-05-25 NOTE — Discharge Summary (Signed)
Physician Discharge Summary  Patient ID: [name] MRN: [id number] DOB/AGE: [birth year] 78 y.o.  Admit date: 05/21/2012 Discharge date: 05/23/12  Admission Diagnoses: Left Lower Quadrant Incisional hernia HTN Hypercholesterolemia GERD OSA on CPAP  Discharge Diagnoses:  Principal Problem:  *Incisional hernia without mention of obstruction or gangrene Active Problems:  HYPERCHOLESTEROLEMIA  GLAUCOMA  HYPERTENSION  GERD  OSA on CPAP   Discharged Condition: good  Hospital Course: 78 yo AAF taken to the operating room for a planned repair of left lower quadrant hernia. She underwent the procedure without complication. An On-Q pump was placed into the soft tissue space for postop analgesia. On POD 1, her foley was removed. PT/OT were consulted. The patient was tolerating clears and her diet was advanced. Her pain was well controlled. Her PCA was stopped and she was started on oral pain meds. On POD 2, she was tolerating a regular diet and ambulating without assistance. PT had recommended no inpt rehab or snf. She was deemed stable for discharge. The On-Q pain pump was removed. Her vitals were normal and she was having bowel function.   Consults: None  Significant Diagnostic Studies: labs: bmet, cbc  Treatments: IV hydration, analgesia: acetaminophen, Morphine and percocet, therapies: PT and OT and surgery: LAPAROSCOPIC REPAIR OF LEFT LOWER QUADRANT INCISIONAL HERNIA WITH MESH  Discharge Exam: Blood pressure 159/69, pulse 75, temperature 97.4 F (36.3 C), temperature source Oral, resp. rate 18, height 5\' 5"  (1.651 m), weight 140 lb (63.504 kg), SpO2 96.00%. Alert, nad Cta b/l Reg Soft, nd; incisions c/d/i. Appropriate mild TTP No edema  Disposition: 01-Home or Self Care      Discharge Orders    Future Appointments: Provider: Department: Dept Phone: Center:   06/12/2012 2:00 PM Atilano Ina, MD,FACS [facility] [phone] None     Future Orders Please Complete  By Expires   Diet - low sodium heart healthy      Increase activity slowly          Medication List     As of 05/25/2012  3:37 PM    TAKE these medications         atorvastatin 10 MG tablet   Commonly known as: LIPITOR   Take 10 mg by mouth daily.      CENTRUM SILVER ULTRA WOMENS PO   Take 1 tablet by mouth daily.      dorzolamide-timolol 22.3-6.8 MG/ML ophthalmic solution   Commonly known as: COSOPT   Place 1 drop into both eyes Three times a day.      esomeprazole 40 MG capsule   Commonly known as: NEXIUM   Take 1 capsule (40 mg total) by mouth daily.      felodipine 5 MG 24 hr tablet   Commonly known as: PLENDIL   Take 5 mg by mouth daily.      LORazepam 0.5 MG tablet   Commonly known as: ATIVAN   Take 0.5 mg by mouth at bedtime.      losartan-hydrochlorothiazide 100-25 MG per tablet   Commonly known as: HYZAAR   Take 1 tablet by mouth daily.      meloxicam 7.5 MG tablet   Commonly known as: MOBIC   Take 7.5 mg by mouth daily as needed. For arthritis pain      metoprolol succinate 50 MG 24 hr tablet   Commonly known as: TOPROL-XL   Take 50 mg by mouth daily.      oxyCODONE-acetaminophen 5-325 MG per tablet   Commonly known as:  PERCOCET/ROXICET   Take 1 tablet by mouth every 4 (four) hours as needed.      venlafaxine 37.5 MG tablet   Commonly known as: EFFEXOR   Take 37.5 mg by mouth 2 (two) times daily.        Follow-up Information    Follow up with Atilano Ina, MD,FACS. On 06/12/2012. (2 PM)    Contact information:   [address] [place] [address] [phone]         Atilano Ina MD [facility]  [phone]  Signed: Atilano Ina 05/25/2012, 3:37 PM

## 2012-06-02 ENCOUNTER — Telehealth (INDEPENDENT_AMBULATORY_CARE_PROVIDER_SITE_OTHER): Payer: Self-pay | Admitting: General Surgery

## 2012-06-02 DIAGNOSIS — R309 Painful micturition, unspecified: Secondary | ICD-10-CM

## 2012-06-02 NOTE — Telephone Encounter (Signed)
Patient calling status post ventral hernia repair with painful urination x 1 week. No odor. No change in color. No fever. No other symptoms.

## 2012-06-02 NOTE — Telephone Encounter (Signed)
Paged Dr Andrey Campanile and he advised patient have a urinalysis done. Patient advised and given directions to [facility]. Order placed.

## 2012-06-03 ENCOUNTER — Other Ambulatory Visit (INDEPENDENT_AMBULATORY_CARE_PROVIDER_SITE_OTHER): Payer: Self-pay | Admitting: General Surgery

## 2012-06-03 ENCOUNTER — Telehealth (INDEPENDENT_AMBULATORY_CARE_PROVIDER_SITE_OTHER): Payer: Self-pay | Admitting: General Surgery

## 2012-06-03 LAB — URINALYSIS, ROUTINE W REFLEX MICROSCOPIC
Bilirubin Urine: NEGATIVE
Glucose, UA: NEGATIVE mg/dL
Protein, ur: 100 mg/dL — AB
Specific Gravity, Urine: 1.017 (ref 1.005–1.030)
pH: 6.5 (ref 5.0–8.0)

## 2012-06-03 LAB — URINALYSIS, MICROSCOPIC ONLY
Casts: NONE SEEN
Crystals: NONE SEEN
Squamous Epithelial / LPF: NONE SEEN
WBC, UA: >50 WBC/hpf — AB (ref <3)

## 2012-06-03 MED ORDER — PHENAZOPYRIDINE HCL 200 MG PO TABS: 200.0000 mg | ORAL_TABLET | Freq: Three times a day (TID) | ORAL | Status: AC | PRN

## 2012-06-03 MED ORDER — CIPROFLOXACIN HCL 500 MG PO TABS: 250.0000 mg | ORAL_TABLET | Freq: Two times a day (BID) | ORAL | Status: AC

## 2012-06-03 NOTE — Telephone Encounter (Signed)
Message copied by Liliana Cline on Wed Jun 03, 2012  9:35 AM ------      Message from: Andrey Campanile, ERIC M      Created: Wed Jun 03, 2012  8:42 AM       pls call pt - pick up rx at pharmacy

## 2012-06-03 NOTE — Progress Notes (Signed)
Pt called in with dysuria. UA suggests mild UTI. Will rx 3 days of cipro and some pyridium

## 2012-06-03 NOTE — Telephone Encounter (Signed)
Patient aware urinalysis abnormal and she is picking up RX at her pharmacy. She will call with any questions or if no better and follow up at scheduled office visit.

## 2012-06-12 ENCOUNTER — Encounter (INDEPENDENT_AMBULATORY_CARE_PROVIDER_SITE_OTHER): Payer: Self-pay | Admitting: General Surgery

## 2012-06-12 ENCOUNTER — Ambulatory Visit (INDEPENDENT_AMBULATORY_CARE_PROVIDER_SITE_OTHER): Payer: Medicare Other | Admitting: General Surgery

## 2012-06-12 VITALS — BP 148/70 | HR 72 | Temp 97.3°F | Resp 16 | Ht 65.5 in | Wt 149.5 lb

## 2012-06-12 DIAGNOSIS — Z09 Encounter for follow-up examination after completed treatment for conditions other than malignant neoplasm: Secondary | ICD-10-CM

## 2012-06-12 NOTE — Patient Instructions (Signed)
Continue not to lift, push, or pull anything greater than 15 pounds for another 4 weeks

## 2012-06-12 NOTE — Progress Notes (Signed)
Subjective:     Patient ID: [name], female   DOB: 24-Jul-1928, 78 y.o.   MRN: [id number]  HPI 78 year old African American female comes in today for her first postoperative appointment after undergoing a laparoscopic repair of a left lateral lower abdominal wall incisional hernia on October 17. She was discharged from the hospital in October 19. She states that she is done well since surgery. She denies any abdominal pain. She denies any fever, chills, nausea, vomiting, diarrhea or constipation. She states that she has had some episodes of noticing a little bit of stool in her underwear. She states that her energy level hasn't returned to what it was before surgery; however, she states that she has noticed a continuous improvement in it since surgery  Review of Systems     Objective:   Physical Exam BP 148/70  Pulse 72  Temp 97.3 F (36.3 C) (Temporal)  Resp 16  Ht 5' 5.5" (1.664 m)  Wt 149 lb 8 oz (67.813 kg)  BMI 24.50 kg/m2  Gen: alert, NAD, non-toxic appearing Pulm: Lungs clear to auscultation, symmetric chest rise CV: regular rate and rhythm Abd: soft, nontender, nondistended. Well-healed trocar sites. No cellulitis. No incisional hernia. Small hematoma in LLQ - about 4 x 4cm.  Ext: no edema     Assessment:     S/p Laparoscopic repair of a left lateral lower quadrant abdominal wall incisional hernia with mesh    Plan:     She appears to be doing great. I am very happy with her progress. I explained that a small hematoma should resolve over time. She was reminded that she should not do any heavy lifting for another 4 weeks. Followup in 6-8 weeks I discussed the importance of drinking 6-8 glasses of water a day as well as increasing the fiber in her diet. We discussed foods that were high in fiber. We also discussed fiber supplements such as Metamucil or Citrucel  [name]. Andrey Campanile, MD, FACS General, Bariatric, & Minimally Invasive Surgery [facility], [place]

## 2012-06-23 ENCOUNTER — Other Ambulatory Visit: Payer: Self-pay | Admitting: Pulmonary Disease

## 2012-07-06 ENCOUNTER — Telehealth: Payer: Self-pay | Admitting: Pulmonary Disease

## 2012-07-06 NOTE — Telephone Encounter (Signed)
lmomtcb x1 for pt 

## 2012-07-07 NOTE — Telephone Encounter (Signed)
Pt states that she has lost from 180 lbs to 140 lbs since 02/2012 without trying. She has also had some decreased appetite as well and would like to be seen to figure out what is going on. This started before she had abdominal hernia repair in 05/2012. Will see TP on Thurs., 07/09/12.

## 2012-07-09 ENCOUNTER — Ambulatory Visit (INDEPENDENT_AMBULATORY_CARE_PROVIDER_SITE_OTHER): Payer: Medicare Other | Admitting: Adult Health

## 2012-07-09 ENCOUNTER — Encounter: Payer: Self-pay | Admitting: Adult Health

## 2012-07-09 VITALS — BP 160/82 | HR 81 | Temp 98.2°F | Ht 65.5 in | Wt 145.0 lb

## 2012-07-09 DIAGNOSIS — R634 Abnormal weight loss: Secondary | ICD-10-CM

## 2012-07-09 DIAGNOSIS — F411 Generalized anxiety disorder: Secondary | ICD-10-CM

## 2012-07-09 MED ORDER — SERTRALINE HCL 50 MG PO TABS: 50.0000 mg | ORAL_TABLET | Freq: Every day | ORAL | Status: DC

## 2012-07-09 NOTE — Assessment & Plan Note (Signed)
Stop effexor  Begin zoloft 50mg  daily  Advised if flare off , call back to office

## 2012-07-09 NOTE — Progress Notes (Signed)
Subjective:    Patient ID: [name], female    DOB: [birth year], 78 y.o.   MRN: [id number]  HPI 78 y/o BF with multiple medical problems as noted below...  Followed for general medical purposes w/ hx of OSA, HBP, CAD, Hyperchol, Multinod thyroid, HH, Divertics & polyps, DJD, Anxiety, etc...  ~  BMW41:  her CC is "tired- no energy" & we discussed checking blood work... needs to incr exercise program/ mobility, take MVI/ VitD... she saw DrNishan 11/10 for f/u HBP, palpit, Lipids> event recorder showed PACs/ PVCs only & no med changes made... she has severe DJD & bilat TKRs> uses Mobic Prn... still under the care of DrPlovsky for psyche- & remains on Wellbutrin, Celexa, Lorazepam... ~  Jul11:  she fell recently & hurt her left hand w/ some persist tingling & she will check w/ her Ortho, DrDean... otherw stable- using CPAP & tol well;  BP controlled on meds;  no CP/ angina;  Lipids controlled on Lip10;  GI is stable;  DJD on Mobic; and nerve meds changed by DrPlovsky...   ~  August 16, 2010:  she has hx divertics/ IBS & developed some diarrhea & fecal incontinence several times per week & at night> DrStark tried to refer her to [facility] for the needed studies but she misunderstood somehow, anyway she says all symptoms better w/ regular dosing of Metamucil... she is overall improved> has a new dog= poodle mix "Keebler" & it helps her to walk... BP controlled on meds;  denies CP, palpit, SOB, etc;  f/u labs all look good;  she notes she is due for GYN check w/ DrNeal & will inquire about BMD as well...  ~  February 18, 2011:  59mo ROV & Arelyn is stable overall> notes some memory problems and has had some intermittent palpitations- she's been evaluated by Walker Kehr for Cards (seen 2/12) & event monitor revealed some PACs & PVCs- asked to take an extra 1/4 of the ToprolXL as needed...  She also had GI eval by DrEdwards for mult GI symptoms- gas, fecal incont- w/ Colonoscopy 5/12 showing divertics, hems, otherw  neg & bx was neg/benign no microscopic colitis;  Asked to decr milk products, given Hyoscyamine 0.125mg  SL Qid prn...  Given TDAP vaccine today.  ~  July 11, 2011:  40mo ROV & add-on for wt loss> pt called very concerned about losing "20lbs in 3 months" but on eval today it is only 11# in 40months from 177==>166# w/ her 71" height her current BMI is 27.5;  She notes good appetite, usually eats 3 meals/d, and denies any GI symptoms- w/o jaw prob, swallowing difficulty, abd pain, N/V, D/C/blood, etc; she had colonoscopy 5/12 by DrEdwards w/ divertics, hems, no polyps, no lesions; still she is quite concerned & we outlined an evaluation including labs & CT Chest/ Abd/ Pelvis to address her concerns and r/o underlying/ hidden reason for her wt loss...    BP well regulated on meds & she denies CP, palpit, dizzy, SOB, edema, etc;  Chol looks good on Lip10;  She has thyroid nodule but has been clinically & biochemically euthyroid;  She notes a little arthritis pain in her neck & wants refill Mobic;  NOTE:  LABS ret w/ K=3.0 on her Hyzaar 100-25 & K20Bid> [facility] & they note Hyzaar filled 10/31 #30 & 9/15 #30, but K20 last filled 09/01/09- we called in #60 1Bid (pt asked again to bring all meds to every visit);  See prob list  below>>  ~  November 11, 2011:  28mo ROV & [name] continues to complain of weight loss (wt=163# down 3# further in the last 28mo) despite drinking Boost Bid she says; reminded to eat 3 meals regularly & use the supplement betw meals as a "medication";  She intermittently & irregularly c/o abd discomfort- sometimes when she eats, sometimes in LLQ area; Denies n/v, dysphagia, reflux symptoms, change in bowels or blood seen; she sees DrJEdwards for GI> as noted above- she had colonoscopy 5/12 by DrEdwards w/ divertics, hems, no polyps, no lesions; and we did CT Chest/ Abd/ Pelvis 12/12 w/ cardiomeg, divertics,but otherw neg & no lesions seen... ?she wants further eval & we discussed  referral back to GI for their review & recommendations>> (Appt sched for 4/11 at 10:30AM)...    During last OV her potassium was low at 3.0 & call to Pharm indicated she wasn't filling her KCl supplement (on Hyzaar for BP- see below); we called in K20Bid & she tells me she is taking it properly ever since> K=3.8 today, continue same...    LABS 4/13:  FLP on Lip10 showed TChol 223, TG 90, HDL 62, LDL 140 (I called Pharm- hasn't refilled in 5 months!);  Chems- wnl w/ K=3.8 on K20Bid...  ~  May 12, 2012:  1mo ROV & DrEWilson, [facility] plans spigelian hernia repair soon;  Weight is down to 148#, still w/ pretty good appetite, eating 3 meals + Boost etc... She has no specific complaints & her pre-op labs all look good...    We reviewed prob list, meds, xrays and labs> see below for updates >>  CXR 10/13 showed heart at upper lim of norm, lungs clear, NAD.Marland KitchenMarland Kitchen EKG 10/13 showed Sinus arrhythmia, rate63, incr voltage, NSSTTWA, no change from prev... LABS 10/13:  Chems- wnl w/ creat=0.91;  CBC- wnl w/ Hg=13.8 & Alb=3.5;  TFTs= wnl, euthyroid...  07/09/2012 Acute OV  Returns for persistent weight loss Has had steady weight loss w/ decreased appetite.  pt states she eats regularly w/ boost in-between meals Pt has had an extensive workup for weight loss over the past 2 years .    Prev GI evals from DrStark, then DrJEdwards as above..., weight has trended down from 192 (06/2010 , ~  7/12:  Weight = 177#, ~  12/12:  Weight = 166# , 145 07/09/2012  ~  CT Chest/Abd/Pelvis 12/12 showed cardiomeg, clear lungs & no lesions in the chest; Divertics & otherw no signif lesions in the Abd... ~  4/13:  Weight = 163#; Pt still c/o wt loss & we will refer back to GI for further assessment & on-going management... ~  CT Abd&Pelvis 7/13 by DrRNeal showed cardiomegaly, prob abd wall hernia in LLQ c/w Spigelian hernia w/ 5-6cm hernia sac, numerous divertics (no complic), calcif in Ao & iliacs, s/p hyst/ GB, multilevel DDD, left  posterior urinary bladder divertic, NO MASSES etc... Had abdominal hernia repair 05/2012 , did well , no abdominal pain or n/v. No constipation .  Labs have been unrevealing w/ nml cbc, and thyroid panel.   Says she eats but has to make herself and drinks boost in b/tw meals.   Chart review shows pt switched from wellbutrin  To effexor around end of 2011 , early 2012  Flowsheet shows weight has trended down since being on this medication.  We discussed changing  effexor. She has not been seeing Dr. Edd Fabian  Lately.  Anxiety /depression symptoms controlled on current regimen.  Problem List:       GLAUCOMA - on eye drops from DrShapiro...  HEARING LOSS - saw DrByers for hearing eval & they discussed hearing aides.  CHRONIC OSTEOMYELITIS OTHER SPECIFIED SITES (ICD-730.18) - she developed osteomyelitis of the right mandible 2009 related to a tooth abcess and path showed bact colonies consistant w/ Actinomyces- oral surg= DrRehm & seen by ID- DrFitzgerald and was treated w/ Clindamycin (due to Pen allergy)...  she stopped the Clindamycin on her own... last seen by DrFitzgerald 4/10 (90mo off Clinda) and doing satis- he released her... still follows up w/ DrRehm for OralSurg...  OBSTRUCTIVE SLEEP APNEA (ICD-327.23) - mod OSA w/ sleep study 8/04 showing RDI=23, consult DrClance... Rx CPAP 10... states she is using CPAP regularly and tol well...  BRONCHITIS, RECURRENT (ICD-491.9) - no recent upper resp infection... ~  CXR 1/11 showed clear lungs, WNL.[name]. ~  CT Chest (along w/ Abd/Pelvis) 12/12 due to c/o wt loss showed cardiomegaly, clear lungs, NAD...  HYPERTENSION (ICD-401.9) - controlled on TOPROL XL 50mg /d, PLENDIL 5mg /d, HYZAAR 100/25 daily, & KCl Bid...  ~  7/12: BP=136/74 and asymptomatic- denies HA, fatigue, visual changes, CP, palipit, dizziness, syncope, dyspnea, edema, etc... she has a long hx of chr noncompliance w/ med Rx... ~  12/12:  BP= 124/82 and she notes wt loss  & some weakness; Labs revealed K=3.0 & pharm confirms she hasn't filled K20 since 1/11> asked to bring all meds bottles to every visit & we called in K20 1Bid #60... ~  4/13:  BP= 132/88 & she confirms taking meds properly + the K20Bid; LABS showed K=3.8, Renal= wnl, Creat=0.9; rec to continue same meds... ~  10/13:  BP= 122/70 & she remains asymptomatic...  CAD (ICD-414.00) - non-obstructive disease w/ cath 1/06 showing 30% focal stenosis in CIRC... NuclearStressTest 9/07 showed apical thinning, no ischemia, EF=69%... followed by Walker Kehr for her HBP, palpit, lipids> Event recoder showed benign PVCs & PACs, no change in med Rx... she can't take ASA, she says.  HYPERCHOLESTEROLEMIA (ICD-272.0) - on LIPITOR 10mg /d...  ~  FLP 8/08 w/ TChol 174, TG 76, HDL 65, LDL 94 ~  FLP 7/09 showed TChol 173, TG 76, HDL 57, LDL 101 ~  FLP 7/10 showed TChol 174, TG 94, HDL 57, LDL 99 ~  FLP 7/11 showed TChol 166, TG 90, HDL 58, LDL 91 ~  FLP 1/12 on Lip10 showed TChol 158, TG 91, HDL 53, LDL 86 ~  FLP 4/13 on Lip10 showed TChol 223, TG 90, HDL 62, LDL 140; what happened? Call to pharm revealed that she hasn't refilled Lip10 in 5months! Rec to take daily...  Hx of THYROID NODULE (ICD-241.0) - she has multinodular thyroid, no palp nodules, clinically euthyroid & doing well...  ~  labs 8/08 showed TSH = 0.87 ~  labs 7/09 showed TSH = 0.70 ~  labs 7/10 showed TSH= 1.12 ~  labs 1/11 showed TSH= 1.06 ~  labs 1/12 showed TSH= 1.53 ~  Labs 12/12 showed TSH= 1.08, FreeT3= 2.5 (2.3-4.2). FreeT4= 0.66 (0.60-1.60)  HIATAL HERNIA (ICD-553.3) - on NEXIUM 40mg /d... HH on prev CTscans... EGD in 1998 w/ distal esoph stricture dilated... no recurrent dysphagia etc... ~  last EGD 7/09 by DrStark showed 4cmHH, GERD, gastritis & gastic polyps- neg HPylori... Rx w/ NEXIUM 40mg Bid ==> then weaned to one per day.  DIVERTICULOSIS OF COLON (ICD-562.10), & COLONIC POLYPS, ADENOMATOUS, HX OF (ICD-V12.72) - colonoscopy 5/05 by DrStark  w/ several sm 3-4 mm polyps, divertics, & f/u planned  10yrs... hosp in 2006 w/ divertic bleed that resolved spont... repeat hosp and f/u colonoscopy 7/09 showed divertics, and several 4-47mm adenomatous polyps... ~  11/11:  eval by DrStark for diarrhea & fecal incontinence> he rec refer to [facility], she declined & says she improved w/ METAMUCIL daily. ~  5/12:  She sought second opinion GI from DrEdwards> colonoscopy w/ divertics, hems, no colitis; Asked to decr milk products, given Hyoscyamine 0.125mg  SL Qid prn... ~  CT Abd 7/13 showed LLQ Spigelian hernia on CT Abd per DrNeal; pt referred to [facility] & DrWilson plans hernia repair soon...  Pt concern for weight loss >> despite good appetite, eating well she says, taking Boost supplements Bid... ~  Prev GI evals from DrStark, then DrJEdwards as above... ~  7/12:  Weight = 177# ~  12/12:  Weight = 166# ~  CT Chest/Abd/Pelvis 12/12 showed cardiomeg, clear lungs & no lesions in the chest; Divertics & otherw no signif lesions in the Abd... ~  4/13:  Weight = 163#; Pt still c/o wt loss & we will refer back to GI for further assessment & on-going management... ~  CT Abd&Pelvis 7/13 by DrRNeal showed cardiomegaly, prob abd wall hernia in LLQ c/w Spigelian hernia w/ 5-6cm hernia sac, numerous divertics (no complic), calcif in Ao & iliacs, s/p hyst/ GB, multilevel DDD, left posterior urinary bladder divertic, NO MASSES etc... ~  10/13:  Weight is down to 148# still w/ pretty good appetite, eating 3 meals + Boost etc...  DEGENERATIVE JOINT DISEASE (ICD-715.90) - followed by DrDean w/ severe DJD, s/p bilat TKRs... S/P Left TKR in 12/04 by DrDean & Right TKR 7/02 by DrKrege... known CSpine and L/S spine disease... s/p recent epid steroid shot in lower back w/ improvement... we refilled her MOBIC 7.5mg  for as needed use. ~  8/11:  she fell & saw DrDean & DrNewton for pain & paresthesias in left arm & improved w/ CSpine injection.  BACK PAIN, LUMBAR (ICD-724.2) >> she  has known multilevel DDD w/ disc/osteophyte complexes, retrolisthesis, etc (see CT Abd 7/13)...  VITAMIN D DEFICIENCY (ICD-268.9) - Vit D level Jul09 = 19 & started on Vit D 50K weekly... ~  labs 7/10 showed Vit D level = 33... OK to switch to OTC Vit D 1000 u daily, also takes MVI & Calcium. ~  labs 1/12 showed Vit D level = 26... rec incr OTC supplement to 2000 u daily. ~  Labs 12/12 showed Vit D level = 35... rec to continue 2000u daily.  ABNORMALITY OF GAIT (ICD-781.2)  ANXIETY (ICD-300.00) - she is under the care of DrPlovsky for Psychiatry now on EFFEXOR XR 75mg - 2 tabs Qam, & LORAZEPAM 0.5mg  as needed... she was prev on Wellbutrin & Celexa, but DrPlovsky makes freq changes.  ANEMIA (ICD-285.9) - after her diverticular bleed in 2006... on Fe therapy w/ resolution... ~  labs 8/08 showed Hg= 12.8 ~  labs 7/10 showed Hg= 13.2 ~  labs 1/11 showed Hg= 13.9, Fe= 66 (16%sat) ~  labs 1/12 showed Hg= 13.5 ~  Labs 12/12 showed Hg= 14.0  HEALTH MAINTENANCE:  She gets the annual Flu vaccines each fall;  Given TDAP 7/12...   Past Surgical History  Procedure Date  . Appendectomy   . Cholecystectomy   . Abdominal hysterectomy   . Total knee arthroplasty     left  . Total knee arthroplasty     right  . Cataract extraction   . Inguinal hernia repair 05/21/2012    WITH MESH   .  Ventral hernia repair 05/21/2012    Procedure: LAPAROSCOPIC VENTRAL HERNIA;  Surgeon: Atilano Ina, MD,FACS;  Location: [facility] OR;  Service: General;  Laterality: N/A;    Outpatient Encounter Prescriptions as of 07/09/2012  Medication Sig Dispense Refill  . atorvastatin (LIPITOR) 10 MG tablet Take 10 mg by mouth daily.      . dorzolamide-timolol (COSOPT) 22.3-6.8 MG/ML ophthalmic solution Place 1 drop into both eyes Three times a day.      . esomeprazole (NEXIUM) 40 MG capsule Take 1 capsule (40 mg total) by mouth daily.  30 capsule  3  . felodipine (PLENDIL) 5 MG 24 hr tablet Take 5 mg by mouth daily.      [name]  lactose free nutrition (BOOST) LIQD 1 drink 2-3 times daily      . LORazepam (ATIVAN) 0.5 MG tablet Take 0.5 mg by mouth at bedtime.      [name] losartan-hydrochlorothiazide (HYZAAR) 100-25 MG per tablet Take 1 tablet by mouth daily.      . meloxicam (MOBIC) 7.5 MG tablet Take 7.5 mg by mouth daily as needed. For arthritis pain      . metoprolol succinate (TOPROL-XL) 50 MG 24 hr tablet TAKE 1 TABLET EVERY DAY  30 tablet  2  . Multiple Vitamins-Minerals (CENTRUM SILVER ULTRA WOMENS PO) Take 1 tablet by mouth daily.      [name] venlafaxine (EFFEXOR) 37.5 MG tablet Take 37.5 mg by mouth 2 (two) times daily.      . [DISCONTINUED] metoprolol succinate (TOPROL-XL) 50 MG 24 hr tablet Take 50 mg by mouth daily.      [name] oxyCODONE-acetaminophen (PERCOCET/ROXICET) 5-325 MG per tablet Take 1 tablet by mouth every 4 (four) hours as needed.  30 tablet  0  . phenazopyridine (PYRIDIUM) 200 MG tablet Take 1 tablet (200 mg total) by mouth 3 (three) times daily as needed for pain.  15 tablet  0    Allergies  Allergen Reactions  . Amoxicillin Itching    Chest and back  . Cephalexin Itching    Chest and back  . Penicillins Itching    Chest and back    Current Medications, Allergies, Past Medical History, Past Surgical History, Family History, and Social History were reviewed in [employer] record.     Review of Systems         See HPI - all other systems neg except as noted...   The patient complains of dyspnea on exertion and difficulty walking.  The patient denies anorexia, fever, weight loss, weight gain, vision loss, decreased hearing, hoarseness, chest pain, syncope, peripheral edema, prolonged cough, headaches, hemoptysis, abdominal pain, melena, hematochezia, severe indigestion/heartburn, hematuria, incontinence, muscle weakness, suspicious skin lesions, transient blindness, depression, unusual weight change, abnormal bleeding, enlarged lymph nodes, and angioedema.     Objective:    Physical Exam      WD, Overweight, 78 y/o BF in NAD.Marland KitchenMarland Kitchen 145 lbs  GENERAL:  Alert, pleasant & cooperative...  HEENT:  [place]/AT,  , EACs-clear, TMs-wnl, NOSE-clear, THROAT-clear & wnl, JAW- no pain or tenderness. NECK:  Supple w/ fairROM; no JVD; normal carotid impulses w/o bruits; no thyromegaly or nodules palpated (cannot feel the multinod goiter), no lymphadenopathy. CHEST:  Clear to P & A; without wheezes/ rales/ or rhonchi heard... HEART:  Regular Rhythm; without murmurs/ rubs/ or gallops detected... ABDOMEN:  Soft & nontender; normal bowel sounds; no organomegaly or masses palpated... EXT:  s/p bilat TKRs, mod arthritic changes; no varicose veins/ +venous insuffic/ tr edema. NEURO:  CN's intact; no focal neuro deficits... DERM:  No lesions noted; no rash etc...  RADIOLOGY DATA:  Reviewed in the EPIC EMR & discussed w/ the patient...  LABORATORY DATA:  Reviewed in the EPIC EMR & discussed w/ the patient...   Assessment & Plan:

## 2012-07-09 NOTE — Patient Instructions (Addendum)
Stop Effexor .  Begin Zoloft 50mg  daily  If depression or anxiety worsen , call our office immediately If develop dizziness call back to our office.  follow up 4 weeks with Dr. Kriste Basque  [place] Please contact office for sooner follow up if symptoms do not improve or worsen or seek emergency care

## 2012-07-09 NOTE — Assessment & Plan Note (Signed)
?   Etiology  Extensive workup from GI and labs, CT scan have been unrevealing  Possible side effect of effexor  Will try off effexor for now -replace with zoloft  follow up 4 weeks.

## 2012-08-06 ENCOUNTER — Encounter (INDEPENDENT_AMBULATORY_CARE_PROVIDER_SITE_OTHER): Payer: Self-pay | Admitting: General Surgery

## 2012-08-06 ENCOUNTER — Ambulatory Visit (INDEPENDENT_AMBULATORY_CARE_PROVIDER_SITE_OTHER): Payer: Medicare Other | Admitting: General Surgery

## 2012-08-06 VITALS — BP 128/70 | HR 64 | Temp 99.7°F | Resp 20 | Ht 65.5 in | Wt 142.0 lb

## 2012-08-06 DIAGNOSIS — Z09 Encounter for follow-up examination after completed treatment for conditions other than malignant neoplasm: Secondary | ICD-10-CM

## 2012-08-06 NOTE — Progress Notes (Signed)
79 year old [place] American female comes in for followup after undergoing laparoscopic repair of a left lower quadrant hernia. She states that she has been doing well since her last visit. She states her appetite still is not back to normal. She denies any fever, chills, nausea, vomiting, diarrhea. The constipation she was having at her last appointment has dramatically improved.  BP 128/70  Pulse 64  Temp 99.7 F (37.6 C) (Temporal)  Resp 20  Ht 5' 5.5" (1.664 m)  Wt 142 lb (64.411 kg)  BMI 23.27 kg/m2  Alert, no apparent distress Abdomen-soft, nontender, nondistended. Well-healed trocar incisions. The left lower quadrant subcutaneous hematoma which was in her lateral left lower quadrant has significantly decreased in size. It is now about the size of a marble.  Assessment and plan: 79 year old African American female status post laparoscopic repair of a left lower quadrant abdominal wall hernia. The postoperative hematoma has almost completely resolved. She has been released to full activities. I believe the small residual marble sized hematoma will continue to improve with time. Followup as needed  [name]. Andrey Campanile, MD, FACS General, Bariatric, & Minimally Invasive Surgery [facility], [place]

## 2012-08-06 NOTE — Patient Instructions (Signed)
You are free to resume full activities

## 2012-08-07 ENCOUNTER — Ambulatory Visit: Payer: Medicare Other | Admitting: Adult Health

## 2012-08-07 ENCOUNTER — Encounter: Payer: Self-pay | Admitting: Adult Health

## 2012-08-07 ENCOUNTER — Ambulatory Visit (INDEPENDENT_AMBULATORY_CARE_PROVIDER_SITE_OTHER): Payer: Medicare Other | Admitting: Adult Health

## 2012-08-07 VITALS — BP 136/70 | HR 53 | Temp 98.3°F | Ht 65.5 in | Wt 143.8 lb

## 2012-08-07 DIAGNOSIS — R634 Abnormal weight loss: Secondary | ICD-10-CM

## 2012-08-07 DIAGNOSIS — F411 Generalized anxiety disorder: Secondary | ICD-10-CM

## 2012-08-07 MED ORDER — SERTRALINE HCL 50 MG PO TABS: 75.0000 mg | ORAL_TABLET | Freq: Every day | ORAL | Status: DC

## 2012-08-07 NOTE — Patient Instructions (Addendum)
Increase Zoloft 75mg  daily  If depression or anxiety worsen , call our office immediately follow up 2-3 months  with Dr. Kriste Basque  Please contact office for sooner follow up if symptoms do not improve or worsen or seek emergency care

## 2012-08-07 NOTE — Progress Notes (Signed)
Subjective:    Patient ID: [name], female    DOB: Jan 29, 1928, 79 y.o.   MRN: [id number]  HPI 79 y/o BF with multiple medical problems as noted below...  Followed for general medical purposes w/ hx of OSA, HBP, CAD, Hyperchol, Multinod thyroid, HH, Divertics & polyps, DJD, Anxiety, etc...  ~  [id number]:  her CC is "tired- no energy" & we discussed checking blood work... needs to incr exercise program/ mobility, take MVI/ VitD... she saw DrNishan 11/10 for f/u HBP, palpit, Lipids> event recorder showed PACs/ PVCs only & no med changes made... she has severe DJD & bilat TKRs> uses Mobic Prn... still under the care of DrPlovsky for psyche- & remains on Wellbutrin, Celexa, Lorazepam... ~  Jul11:  she fell recently & hurt her left hand w/ some persist tingling & she will check w/ her Ortho, DrDean... otherw stable- using CPAP & tol well;  BP controlled on meds;  no CP/ angina;  Lipids controlled on Lip10;  GI is stable;  DJD on Mobic; and nerve meds changed by DrPlovsky...   ~  August 16, 2010:  she has hx divertics/ IBS & developed some diarrhea & fecal incontinence several times per week & at night> DrStark tried to refer her to [facility] for the needed studies but she misunderstood somehow, anyway she says all symptoms better w/ regular dosing of Metamucil... she is overall improved> has a new dog= poodle mix "Keebler" & it helps her to walk... BP controlled on meds;  denies CP, palpit, SOB, etc;  f/u labs all look good;  she notes she is due for GYN check w/ DrNeal & will inquire about BMD as well...  ~  February 18, 2011:  50mo ROV & Chloee is stable overall> notes some memory problems and has had some intermittent palpitations- she's been evaluated by Walker Kehr for Cards (seen 2/12) & event monitor revealed some PACs & PVCs- asked to take an extra 1/4 of the ToprolXL as needed...  She also had GI eval by DrEdwards for mult GI symptoms- gas, fecal incont- w/ Colonoscopy 5/12 showing divertics, hems, otherw  neg & bx was neg/benign no microscopic colitis;  Asked to decr milk products, given Hyoscyamine 0.125mg  SL Qid prn...  Given TDAP vaccine today.  ~  July 11, 2011:  31mo ROV & add-on for wt loss> pt called very concerned about losing "20lbs in 3 months" but on eval today it is only 11# in 31months from 177==>166# w/ her 42" height her current BMI is 27.5;  She notes good appetite, usually eats 3 meals/d, and denies any GI symptoms- w/o jaw prob, swallowing difficulty, abd pain, N/V, D/C/blood, etc; she had colonoscopy 5/12 by DrEdwards w/ divertics, hems, no polyps, no lesions; still she is quite concerned & we outlined an evaluation including labs & CT Chest/ Abd/ Pelvis to address her concerns and r/o underlying/ hidden reason for her wt loss...    BP well regulated on meds & she denies CP, palpit, dizzy, SOB, edema, etc;  Chol looks good on Lip10;  She has thyroid nodule but has been clinically & biochemically euthyroid;  She notes a little arthritis pain in her neck & wants refill Mobic;  NOTE:  LABS ret w/ K=3.0 on her Hyzaar 100-25 & K20Bid> [facility] & they note Hyzaar filled 10/31 #30 & 9/15 #30, but K20 last filled 09/01/09- we called in #60 1Bid (pt asked again to bring all meds to every visit);  See prob list  below>>  ~  November 11, 2011:  72mo ROV & [name] continues to complain of weight loss (wt=163# down 3# further in the last 72mo) despite drinking Boost Bid she says; reminded to eat 3 meals regularly & use the supplement betw meals as a "medication";  She intermittently & irregularly c/o abd discomfort- sometimes when she eats, sometimes in LLQ area; Denies n/v, dysphagia, reflux symptoms, change in bowels or blood seen; she sees DrJEdwards for GI> as noted above- she had colonoscopy 5/12 by DrEdwards w/ divertics, hems, no polyps, no lesions; and we did CT Chest/ Abd/ Pelvis 12/12 w/ cardiomeg, divertics,but otherw neg & no lesions seen... ?she wants further eval & we discussed  referral back to GI for their review & recommendations>> (Appt sched for 4/11 at 10:30AM)...    During last OV her potassium was low at 3.0 & call to Pharm indicated she wasn't filling her KCl supplement (on Hyzaar for BP- see below); we called in K20Bid & she tells me she is taking it properly ever since> K=3.8 today, continue same...    LABS 4/13:  FLP on Lip10 showed TChol 223, TG 90, HDL 62, LDL 140 (I called Pharm- hasn't refilled in 5 months!);  Chems- wnl w/ K=3.8 on K20Bid...  ~  May 12, 2012:  65mo ROV & DrEWilson, [facility] plans spigelian hernia repair soon;  Weight is down to 148#, still w/ pretty good appetite, eating 3 meals + Boost etc... She has no specific complaints & her pre-op labs all look good...    We reviewed prob list, meds, xrays and labs> see below for updates >>  CXR 10/13 showed heart at upper lim of norm, lungs clear, NAD.Marland KitchenMarland Kitchen EKG 10/13 showed Sinus arrhythmia, rate63, incr voltage, NSSTTWA, no change from prev... LABS 10/13:  Chems- wnl w/ creat=0.91;  CBC- wnl w/ Hg=13.8 & Alb=3.5;  TFTs= wnl, euthyroid...  07/09/12  Acute OV  Returns for persistent weight loss Has had steady weight loss w/ decreased appetite.  pt states she eats regularly w/ boost in-between meals Pt has had an extensive workup for weight loss over the past 2 years .    Prev GI evals from DrStark, then DrJEdwards as above..., weight has trended down from 192 (06/2010 , ~  7/12:  Weight = 177#, ~  12/12:  Weight = 166# , 145 08/07/2012  ~  CT Chest/Abd/Pelvis 12/12 showed cardiomeg, clear lungs & no lesions in the chest; Divertics & otherw no signif lesions in the Abd... ~  4/13:  Weight = 163#; Pt still c/o wt loss & we will refer back to GI for further assessment & on-going management... ~  CT Abd&Pelvis 7/13 by DrRNeal showed cardiomegaly, prob abd wall hernia in LLQ c/w Spigelian hernia w/ 5-6cm hernia sac, numerous divertics (no complic), calcif in Ao & iliacs, s/p hyst/ GB, multilevel DDD, left posterior  urinary bladder divertic, NO MASSES etc... Had abdominal hernia repair 05/2012 , did well , no abdominal pain or n/v. No constipation .  Labs have been unrevealing w/ nml cbc, and thyroid panel.   Says she eats but has to make herself and drinks boost in b/tw meals.   Chart review shows pt switched from wellbutrin  To effexor around end of 2011 , early 2012  Flowsheet shows weight has trended down since being on this medication.  We discussed changing  effexor. She has not been seeing Dr. Edd Fabian  Lately.  Anxiety /depression symptoms controlled on current regimen.  >>changed effexor to  zoloft   08/07/2012 Follow up  1 month follow up - weight loss.  still losing wt, appetite still decreased.  reports is tolerating zoloft well. Patient returns for a one-month followup, seen last visit for persistent weight loss , despite using Ensure or boost products between meals. Patient has had an extensive unrevealing workup for her weight loss It was felt possibly last visit. Bad, Effexor could be contributing to persistent drop in her weight so, therefore, she was changed over to Zoloft to see if this would help She does have underlying depression and anxiety. Does not have an extended family. Husband died a couple years ago, and both sons are now deceased. She says that she is tolerating Zoloft well. Desyrel, slightly improved in weight dropped further over the last 4 weeks has only been 2 pounds She did buy a puppy. Over the holidays, and she seems very positive about this. She denies any suicidal or homicidal ideations, chest pain, bloody stools, orthopnea, PND, or leg swelling         Problem List:       GLAUCOMA - on eye drops from DrShapiro...  HEARING LOSS - saw DrByers for hearing eval & they discussed hearing aides.  CHRONIC OSTEOMYELITIS OTHER SPECIFIED SITES (ICD-730.18) - she developed osteomyelitis of the right mandible 2009 related to a tooth abcess and path showed bact colonies  consistant w/ Actinomyces- oral surg= DrRehm & seen by ID- DrFitzgerald and was treated w/ Clindamycin (due to Pen allergy)...  she stopped the Clindamycin on her own... last seen by DrFitzgerald 4/10 (29mo off Clinda) and doing satis- he released her... still follows up w/ DrRehm for OralSurg...  OBSTRUCTIVE SLEEP APNEA (ICD-327.23) - mod OSA w/ sleep study 8/04 showing RDI=23, consult DrClance... Rx CPAP 10... states she is using CPAP regularly and tol well...  BRONCHITIS, RECURRENT (ICD-491.9) - no recent upper resp infection... ~  CXR 1/11 showed clear lungs, WNL.[name]. ~  CT Chest (along w/ Abd/Pelvis) 12/12 due to c/o wt loss showed cardiomegaly, clear lungs, NAD...  HYPERTENSION (ICD-401.9) - controlled on TOPROL XL 50mg /d, PLENDIL 5mg /d, HYZAAR 100/25 daily, & KCl Bid...  ~  7/12: BP=136/74 and asymptomatic- denies HA, fatigue, visual changes, CP, palipit, dizziness, syncope, dyspnea, edema, etc... she has a long hx of chr noncompliance w/ med Rx... ~  12/12:  BP= 124/82 and she notes wt loss & some weakness; Labs revealed K=3.0 & pharm confirms she hasn't filled K20 since 1/11> asked to bring all meds bottles to every visit & we called in K20 1Bid #60... ~  4/13:  BP= 132/88 & she confirms taking meds properly + the K20Bid; LABS showed K=3.8, Renal= wnl, Creat=0.9; rec to continue same meds... ~  10/13:  BP= 122/70 & she remains asymptomatic...  CAD (ICD-414.00) - non-obstructive disease w/ cath 1/06 showing 30% focal stenosis in CIRC... NuclearStressTest 9/07 showed apical thinning, no ischemia, EF=69%... followed by Walker Kehr for her HBP, palpit, lipids> Event recoder showed benign PVCs & PACs, no change in med Rx... she can't take ASA, she says.  HYPERCHOLESTEROLEMIA (ICD-272.0) - on LIPITOR 10mg /d...  ~  FLP 8/08 w/ TChol 174, TG 76, HDL 65, LDL 94 ~  FLP 7/09 showed TChol 173, TG 76, HDL 57, LDL 101 ~  FLP 7/10 showed TChol 174, TG 94, HDL 57, LDL 99 ~  FLP 7/11 showed TChol 166, TG  90, HDL 58, LDL 91 ~  FLP 1/12 on Lip10 showed TChol 158, TG 91, HDL 53, LDL 86 ~  FLP 4/13 on Lip10 showed TChol 223, TG 90, HDL 62, LDL 140; what happened? Call to pharm revealed that she hasn't refilled Lip10 in 5months! Rec to take daily...  Hx of THYROID NODULE (ICD-241.0) - she has multinodular thyroid, no palp nodules, clinically euthyroid & doing well...  ~  labs 8/08 showed TSH = 0.87 ~  labs 7/09 showed TSH = 0.70 ~  labs 7/10 showed TSH= 1.12 ~  labs 1/11 showed TSH= 1.06 ~  labs 1/12 showed TSH= 1.53 ~  Labs 12/12 showed TSH= 1.08, FreeT3= 2.5 (2.3-4.2). FreeT4= 0.66 (0.60-1.60)  HIATAL HERNIA (ICD-553.3) - on NEXIUM 40mg /d... HH on prev CTscans... EGD in 1998 w/ distal esoph stricture dilated... no recurrent dysphagia etc... ~  last EGD 7/09 by DrStark showed 4cmHH, GERD, gastritis & gastic polyps- neg HPylori... Rx w/ NEXIUM 40mg Bid ==> then weaned to one per day.  DIVERTICULOSIS OF COLON (ICD-562.10), & COLONIC POLYPS, ADENOMATOUS, HX OF (ICD-V12.72) - colonoscopy 5/05 by DrStark w/ several sm 3-4 mm polyps, divertics, & f/u planned 51yrs... hosp in 2006 w/ divertic bleed that resolved spont... repeat hosp and f/u colonoscopy 7/09 showed divertics, and several 4-58mm adenomatous polyps... ~  11/11:  eval by DrStark for diarrhea & fecal incontinence> he rec refer to [facility], she declined & says she improved w/ METAMUCIL daily. ~  5/12:  She sought second opinion GI from DrEdwards> colonoscopy w/ divertics, hems, no colitis; Asked to decr milk products, given Hyoscyamine 0.125mg  SL Qid prn... ~  CT Abd 7/13 showed LLQ Spigelian hernia on CT Abd per DrNeal; pt referred to [facility] & DrWilson plans hernia repair soon...  Pt concern for weight loss >> despite good appetite, eating well she says, taking Boost supplements Bid... ~  Prev GI evals from DrStark, then DrJEdwards as above... ~  7/12:  Weight = 177# ~  12/12:  Weight = 166# ~  CT Chest/Abd/Pelvis 12/12 showed cardiomeg, clear lungs  & no lesions in the chest; Divertics & otherw no signif lesions in the Abd... ~  4/13:  Weight = 163#; Pt still c/o wt loss & we will refer back to GI for further assessment & on-going management... ~  CT Abd&Pelvis 7/13 by DrRNeal showed cardiomegaly, prob abd wall hernia in LLQ c/w Spigelian hernia w/ 5-6cm hernia sac, numerous divertics (no complic), calcif in Ao & iliacs, s/p hyst/ GB, multilevel DDD, left posterior urinary bladder divertic, NO MASSES etc... ~  10/13:  Weight is down to 148# still w/ pretty good appetite, eating 3 meals + Boost etc...  DEGENERATIVE JOINT DISEASE (ICD-715.90) - followed by DrDean w/ severe DJD, s/p bilat TKRs... S/P Left TKR in 12/04 by DrDean & Right TKR 7/02 by DrKrege... known CSpine and L/S spine disease... s/p recent epid steroid shot in lower back w/ improvement... we refilled her MOBIC 7.5mg  for as needed use. ~  8/11:  she fell & saw DrDean & DrNewton for pain & paresthesias in left arm & improved w/ CSpine injection.  BACK PAIN, LUMBAR (ICD-724.2) >> she has known multilevel DDD w/ disc/osteophyte complexes, retrolisthesis, etc (see CT Abd 7/13)...  VITAMIN D DEFICIENCY (ICD-268.9) - Vit D level Jul09 = 19 & started on Vit D 50K weekly... ~  labs 7/10 showed Vit D level = 33... OK to switch to OTC Vit D 1000 u daily, also takes MVI & Calcium. ~  labs 1/12 showed Vit D level = 26... rec incr OTC supplement to 2000 u daily. ~  Labs 12/12 showed Vit  D level = 35... rec to continue 2000u daily.  ABNORMALITY OF GAIT (ICD-781.2)  ANXIETY (ICD-300.00) - she is under the care of DrPlovsky for Psychiatry now on EFFEXOR XR 75mg - 2 tabs Qam, & LORAZEPAM 0.5mg  as needed... she was prev on Wellbutrin & Celexa, but DrPlovsky makes freq changes.  ANEMIA (ICD-285.9) - after her diverticular bleed in 2006... on Fe therapy w/ resolution... ~  labs 8/08 showed Hg= 12.8 ~  labs 7/10 showed Hg= 13.2 ~  labs 1/11 showed Hg= 13.9, Fe= 66 (16%sat) ~  labs 1/12 showed Hg=  13.5 ~  Labs 12/12 showed Hg= 14.0  HEALTH MAINTENANCE:  She gets the annual Flu vaccines each fall;  Given TDAP 7/12...   Past Surgical History  Procedure Date  . Appendectomy   . Cholecystectomy   . Abdominal hysterectomy   . Total knee arthroplasty     left  . Total knee arthroplasty     right  . Cataract extraction   . Inguinal hernia repair 05/21/2012    WITH MESH   . Ventral hernia repair 05/21/2012    Procedure: LAPAROSCOPIC VENTRAL HERNIA;  Surgeon: Atilano Ina, MD,FACS;  Location: [facility] OR;  Service: General;  Laterality: N/A;    Outpatient Encounter Prescriptions as of 08/07/2012  Medication Sig Dispense Refill  . atorvastatin (LIPITOR) 10 MG tablet Take 10 mg by mouth daily.      . dorzolamide-timolol (COSOPT) 22.3-6.8 MG/ML ophthalmic solution Place 1 drop into both eyes Three times a day.      . esomeprazole (NEXIUM) 40 MG capsule Take 1 capsule (40 mg total) by mouth daily.  30 capsule  3  . felodipine (PLENDIL) 5 MG 24 hr tablet Take 5 mg by mouth daily.      [name] lactose free nutrition (BOOST) LIQD 1 drink 2-3 times daily      . LORazepam (ATIVAN) 0.5 MG tablet Take 0.5 mg by mouth at bedtime.      [name] losartan-hydrochlorothiazide (HYZAAR) 100-25 MG per tablet Take 1 tablet by mouth daily.      . meloxicam (MOBIC) 7.5 MG tablet Take 7.5 mg by mouth daily as needed. For arthritis pain      . metoprolol succinate (TOPROL-XL) 50 MG 24 hr tablet TAKE 1 TABLET EVERY DAY  30 tablet  2  . Multiple Vitamins-Minerals (CENTRUM SILVER ULTRA WOMENS PO) Take 1 tablet by mouth daily.      . phenazopyridine (PYRIDIUM) 200 MG tablet Take 1 tablet (200 mg total) by mouth 3 (three) times daily as needed for pain.  15 tablet  0  . sertraline (ZOLOFT) 50 MG tablet Take 1.5 tablets (75 mg total) by mouth daily.  30 tablet  5  . [DISCONTINUED] sertraline (ZOLOFT) 50 MG tablet Take 1 tablet (50 mg total) by mouth daily.  30 tablet  5    Allergies  Allergen Reactions  . Amoxicillin Itching      Chest and back  . Cephalexin Itching    Chest and back  . Penicillins Itching    Chest and back    Current Medications, Allergies, Past Medical History, Past Surgical History, Family History, and Social History were reviewed in [employer] record.     Review of Systems         Constitutional:   No  weight loss, night sweats,  Fevers, chills,  +fatigue, or  lassitude.  HEENT:   No headaches,  Difficulty swallowing,  Tooth/dental problems, or  Sore throat,  No sneezing, itching, ear ache, nasal congestion, post nasal drip,   CV:  No chest pain,  Orthopnea, PND, swelling in lower extremities, anasarca, dizziness, palpitations, syncope.   GI  No heartburn, indigestion, abdominal pain, nausea, vomiting, diarrhea, change in bowel habits, loss of appetite, bloody stools.   Resp: No shortness of breath with exertion or at rest.  No excess mucus, no productive cough,  No non-productive cough,  No coughing up of blood.  No change in color of mucus.  No wheezing.  No chest wall deformity  Skin: no rash or lesions.  GU: no dysuria, change in color of urine, no urgency or frequency.  No flank pain, no hematuria   MS:  No joint pain or swelling.  No decreased range of motion.  No back pain.  Psych:  No change in mood or affect. No depression or anxiety.  No memory loss.       Objective:   Physical Exam      WD, Overweight, 79 y/o BF in NAD.Marland KitchenMarland Kitchen 145 lbs >143  GENERAL:  Alert, pleasant & cooperative...  HEENT:  Thermal/AT,  , EACs-clear, TMs-wnl, NOSE-clear, THROAT-clear & wnl, JAW- no pain or tenderness. NECK:  Supple w/ fairROM; no JVD; normal carotid impulses w/o bruits;  , no lymphadenopathy. CHEST:  Clear to P & A; without wheezes/ rales/ or rhonchi heard... HEART:  Regular Rhythm; without murmurs/ rubs/ or gallops detected... ABDOMEN:  Soft & nontender; normal bowel sounds; no organomegaly or masses palpated... EXT:  s/p bilat TKRs, mod arthritic  changes; no varicose veins/ +venous insuffic/ tr edema. NEURO:  CN's intact; no focal neuro deficits... DERM:  No lesions noted; no rash etc...  RADIOLOGY DATA:  Reviewed in the EPIC EMR & discussed w/ the patient...  LABORATORY DATA:  Reviewed in the EPIC EMR & discussed w/ the patient...   Assessment & Plan:

## 2012-08-07 NOTE — Assessment & Plan Note (Signed)
Extensive workup has been unrevealing  Wt loss only 2 lbs over last 4 weeks ? Since stopping effexor  Will cont on zoloft and increase dose for better depression/anxiety coverage.

## 2012-08-07 NOTE — Assessment & Plan Note (Signed)
Anxitey /Depression not fully compensated will increase dose of zoloft   Plan Increase zoloft 75mg  daily  Stress reducers encouraged.

## 2012-09-10 ENCOUNTER — Telehealth: Payer: Self-pay | Admitting: Pulmonary Disease

## 2012-09-10 NOTE — Telephone Encounter (Signed)
Debbie returned call. Hazel Sams

## 2012-09-10 NOTE — Telephone Encounter (Signed)
Per SN----she is followed by Dr. [name] and had and event monitor----have the pt call if she is having symptoms, palpitations, etc.  i attmpted to call the pt but the phone kept hanging up.  Will try back later.

## 2012-09-10 NOTE — Telephone Encounter (Signed)
I spoke with Eunice Blase Software engineer). She made home visit to pt. Stated pt heart rate was slight irregular while beating. Her HR was 76, BP 130/70, resp 16. Pt was fully alert, asymptomatic, and can walk fine. She is calling as FYI to SN since pt was not aware of this. Please advise if you have any recs for pt. thanks

## 2012-09-10 NOTE — Telephone Encounter (Signed)
Debbie returned call again. Hazel Sams

## 2012-09-10 NOTE — Telephone Encounter (Signed)
lmomtcb x1 for debbie

## 2012-09-10 NOTE — Telephone Encounter (Signed)
I spoke with pt and was not able to give HR #'s. Only thing pt stated was she was having "irregular heartbeat".  Couldn't explain anything else.  LMOMTCB x1 for [name] to get mor information

## 2012-09-14 NOTE — Telephone Encounter (Signed)
LMTCB

## 2012-09-16 NOTE — Telephone Encounter (Signed)
The pt is aware of SN recs and verbalized understanding.  

## 2012-10-05 ENCOUNTER — Ambulatory Visit: Payer: Medicare Other | Admitting: Adult Health

## 2012-10-09 ENCOUNTER — Ambulatory Visit: Payer: Medicare Other | Admitting: Pulmonary Disease

## 2012-10-12 ENCOUNTER — Ambulatory Visit (INDEPENDENT_AMBULATORY_CARE_PROVIDER_SITE_OTHER): Payer: Medicare Other | Admitting: Adult Health

## 2012-10-12 ENCOUNTER — Encounter: Payer: Self-pay | Admitting: Adult Health

## 2012-10-12 VITALS — BP 132/84 | HR 56 | Temp 98.1°F | Ht 65.5 in | Wt 138.2 lb

## 2012-10-12 DIAGNOSIS — R634 Abnormal weight loss: Secondary | ICD-10-CM

## 2012-10-12 DIAGNOSIS — F411 Generalized anxiety disorder: Secondary | ICD-10-CM

## 2012-10-12 MED ORDER — SERTRALINE HCL 100 MG PO TABS: 100.0000 mg | ORAL_TABLET | Freq: Every day | ORAL | Status: DC

## 2012-10-12 NOTE — Patient Instructions (Addendum)
Increase Zoloft 100mg  daily  Do not skip meals.  Change to High calorie boost between meals.  Follow up 2 months with Dr. Kriste Basque   Please contact office for sooner follow up if symptoms do not improve or worsen or seek emergency care

## 2012-10-12 NOTE — Progress Notes (Signed)
Subjective:    Patient ID: [name], female    DOB: 1928/02/20, 79 y.o.   MRN: [id number]  HPI 79 y/o BF with multiple medical problems as noted below...  Followed for general medical purposes w/ hx of OSA, HBP, CAD, Hyperchol, Multinod thyroid, HH, Divertics & polyps, DJD, Anxiety, etc...  ~  ZHY86:  her CC is "tired- no energy" & we discussed checking blood work... needs to incr exercise program/ mobility, take MVI/ VitD... she saw DrNishan 11/10 for f/u HBP, palpit, Lipids> event recorder showed PACs/ PVCs only & no med changes made... she has severe DJD & bilat TKRs> uses Mobic Prn... still under the care of DrPlovsky for psyche- & remains on Wellbutrin, Celexa, Lorazepam... ~  Jul11:  she fell recently & hurt her left hand w/ some persist tingling & she will check w/ her Ortho, DrDean... otherw stable- using CPAP & tol well;  BP controlled on meds;  no CP/ angina;  Lipids controlled on Lip10;  GI is stable;  DJD on Mobic; and nerve meds changed by DrPlovsky...   ~  August 16, 2010:  she has hx divertics/ IBS & developed some diarrhea & fecal incontinence several times per week & at night> DrStark tried to refer her to [facility] for the needed studies but she misunderstood somehow, anyway she says all symptoms better w/ regular dosing of Metamucil... she is overall improved> has a new dog= poodle mix "Keebler" & it helps her to walk... BP controlled on meds;  denies CP, palpit, SOB, etc;  f/u labs all look good;  she notes she is due for GYN check w/ DrNeal & will inquire about BMD as well...  ~  February 18, 2011:  338mo ROV & [name] is stable overall> notes some memory problems and has had some intermittent palpitations- she's been evaluated by Walker Kehr for Cards (seen 2/12) & event monitor revealed some PACs & PVCs- asked to take an extra 1/4 of the ToprolXL as needed...  She also had GI eval by DrEdwards for mult GI symptoms- gas, fecal incont- w/ Colonoscopy 5/12 showing divertics, hems, otherw  neg & bx was neg/benign no microscopic colitis;  Asked to decr milk products, given Hyoscyamine 0.125mg  SL Qid prn...  Given TDAP vaccine today.  ~  July 11, 2011:  38mo ROV & add-on for wt loss> pt called very concerned about losing "20lbs in 3 months" but on eval today it is only 11# in 38months from 177==>166# w/ her 23" height her current BMI is 27.5;  She notes good appetite, usually eats 3 meals/d, and denies any GI symptoms- w/o jaw prob, swallowing difficulty, abd pain, N/V, D/C/blood, etc; she had colonoscopy 5/12 by DrEdwards w/ divertics, hems, no polyps, no lesions; still she is quite concerned & we outlined an evaluation including labs & CT Chest/ Abd/ Pelvis to address her concerns and r/o underlying/ hidden reason for her wt loss...    BP well regulated on meds & she denies CP, palpit, dizzy, SOB, edema, etc;  Chol looks good on Lip10;  She has thyroid nodule but has been clinically & biochemically euthyroid;  She notes a little arthritis pain in her neck & wants refill Mobic;  NOTE:  LABS ret w/ K=3.0 on her Hyzaar 100-25 & K20Bid> [facility] called [facility] & they note Hyzaar filled 10/31 #30 & 9/15 #30, but K20 last filled 09/01/09- we called in #60 1Bid (pt asked again to bring all meds to every visit);  See prob list  below>>  ~  November 11, 2011:  363mo ROV & [name] continues to complain of weight loss (wt=163# down 3# further in the last 363mo) despite drinking Boost Bid she says; reminded to eat 3 meals regularly & use the supplement betw meals as a "medication";  She intermittently & irregularly c/o abd discomfort- sometimes when she eats, sometimes in LLQ area; Denies n/v, dysphagia, reflux symptoms, change in bowels or blood seen; she sees DrJEdwards for GI> as noted above- she had colonoscopy 5/12 by DrEdwards w/ divertics, hems, no polyps, no lesions; and we did CT Chest/ Abd/ Pelvis 12/12 w/ cardiomeg, divertics,but otherw neg & no lesions seen... ?she wants further eval & we discussed  referral back to GI for their review & recommendations>> (Appt sched for 4/11 at 10:30AM)...    During last OV her potassium was low at 3.0 & call to Pharm indicated she wasn't filling her KCl supplement (on Hyzaar for BP- see below); we called in K20Bid & she tells me she is taking it properly ever since> K=3.8 today, continue same...    LABS 4/13:  FLP on Lip10 showed TChol 223, TG 90, HDL 62, LDL 140 (I called Pharm- hasn't refilled in 5 months!);  Chems- wnl w/ K=3.8 on K20Bid...  ~  May 12, 2012:  63mo ROV & DrEWilson, [facility] plans spigelian hernia repair soon;  Weight is down to 148#, still w/ pretty good appetite, eating 3 meals + Boost etc... She has no specific complaints & her pre-op labs all look good...    We reviewed prob list, meds, xrays and labs> see below for updates >>  CXR 10/13 showed heart at upper lim of norm, lungs clear, NAD.Marland KitchenMarland Kitchen EKG 10/13 showed Sinus arrhythmia, rate63, incr voltage, NSSTTWA, no change from prev... LABS 10/13:  Chems- wnl w/ creat=0.91;  CBC- wnl w/ Hg=13.8 & Alb=3.5;  TFTs= wnl, euthyroid...  07/09/12  Acute OV  Returns for persistent weight loss Has had steady weight loss w/ decreased appetite.  pt states she eats regularly w/ boost in-between meals Pt has had an extensive workup for weight loss over the past 2 years .    Prev GI evals from DrStark, then DrJEdwards as above..., weight has trended down from 192 (06/2010 , ~  7/12:  Weight = 177#, ~  12/12:  Weight = 166# , 145 10/12/2012  ~  CT Chest/Abd/Pelvis 12/12 showed cardiomeg, clear lungs & no lesions in the chest; Divertics & otherw no signif lesions in the Abd... ~  4/13:  Weight = 163#; Pt still c/o wt loss & we will refer back to GI for further assessment & on-going management... ~  CT Abd&Pelvis 7/13 by DrRNeal showed cardiomegaly, prob abd wall hernia in LLQ c/w Spigelian hernia w/ 5-6cm hernia sac, numerous divertics (no complic), calcif in Ao & iliacs, s/p hyst/ GB, multilevel DDD, left  posterior urinary bladder divertic, NO MASSES etc... Had abdominal hernia repair 05/2012 , did well , no abdominal pain or n/v. No constipation .  Labs have been unrevealing w/ nml cbc, and thyroid panel.   Says she eats but has to make herself and drinks boost in b/tw meals.   Chart review shows pt switched from wellbutrin  To effexor around end of 2011 , early 2012  Flowsheet shows weight has trended down since being on this medication.  We discussed changing  effexor. She has not been seeing Dr. Edd Fabian  Lately.  Anxiety /depression symptoms controlled on current regimen.  >>changed effexor to  zoloft   08/07/12  Follow up  1 month follow up - weight loss.  still losing wt, appetite still decreased.  reports is tolerating zoloft well. Patient returns for a one-month followup, seen last visit for persistent weight loss , despite using Ensure or boost products between meals. Patient has had an extensive unrevealing workup for her weight loss It was felt possibly last visit that Effexor could be contributing to persistent drop in her weight so, therefore, she was changed over to Zoloft to see if this would help She does have underlying depression and anxiety. Does not have an extended family. Husband died a couple years ago, and both sons are now deceased. She says that she is tolerating Zoloft well. Desyrel, slightly improved in weight dropped further over the last 4 weeks has only been 2 pounds She did buy a puppy. Over the holidays, and she seems very positive about this. She denies any suicidal or homicidal ideations, chest pain, bloody stools, orthopnea, PND, or leg swelling   10/12/2012 Follow Up  Pt returns for 2 month follow up .  Continues to see weight trend down . Is down 5 lbs from last ov. She eats 2 meals daily and drinks boost between meals.  She denies bloody stools, n/v or abd pain  Extensive workup for weight loss has been unrevealing.  Does have depression and anxiety , now  currently on zoloft , changed off Effexor (? Wt loss) . No improvement in weight since change.  Does get lonely and feel sad intermittently , husband passed away couple of years ago.  We discussed dietary choices and not skipping lunch.  No chest pain, edema or dyspnea.              Problem List:       GLAUCOMA - on eye drops from DrShapiro...  HEARING LOSS - saw DrByers for hearing eval & they discussed hearing aides.  CHRONIC OSTEOMYELITIS OTHER SPECIFIED SITES (ICD-730.18) - she developed osteomyelitis of the right mandible 2009 related to a tooth abcess and path showed bact colonies consistant w/ Actinomyces- oral surg= DrRehm & seen by ID- DrFitzgerald and was treated w/ Clindamycin (due to Pen allergy)...  she stopped the Clindamycin on her own... last seen by DrFitzgerald 4/10 (64mo off Clinda) and doing satis- he released her... still follows up w/ DrRehm for OralSurg...  OBSTRUCTIVE SLEEP APNEA (ICD-327.23) - mod OSA w/ sleep study 8/04 showing RDI=23, consult DrClance... Rx CPAP 10... states she is using CPAP regularly and tol well...  BRONCHITIS, RECURRENT (ICD-491.9) - no recent upper resp infection... ~  CXR 1/11 showed clear lungs, WNL.[name]. ~  CT Chest (along w/ Abd/Pelvis) 12/12 due to c/o wt loss showed cardiomegaly, clear lungs, NAD...  HYPERTENSION (ICD-401.9) - controlled on TOPROL XL 50mg /d, PLENDIL 5mg /d, HYZAAR 100/25 daily, & KCl Bid...  ~  7/12: BP=136/74 and asymptomatic- denies HA, fatigue, visual changes, CP, palipit, dizziness, syncope, dyspnea, edema, etc... she has a long hx of chr noncompliance w/ med Rx... ~  12/12:  BP= 124/82 and she notes wt loss & some weakness; Labs revealed K=3.0 & pharm confirms she hasn't filled K20 since 1/11> asked to bring all meds bottles to every visit & we called in K20 1Bid #60... ~  4/13:  BP= 132/88 & she confirms taking meds properly + the K20Bid; LABS showed K=3.8, Renal= wnl, Creat=0.9; rec to continue same meds... ~   10/13:  BP= 122/70 & she remains asymptomatic...  CAD (ICD-414.00) -  non-obstructive disease w/ cath 1/06 showing 30% focal stenosis in CIRC... NuclearStressTest 9/07 showed apical thinning, no ischemia, EF=69%... followed by Walker Kehr for her HBP, palpit, lipids> Event recoder showed benign PVCs & PACs, no change in med Rx... she can't take ASA, she says.  HYPERCHOLESTEROLEMIA (ICD-272.0) - on LIPITOR 10mg /d...  ~  FLP 8/08 w/ TChol 174, TG 76, HDL 65, LDL 94 ~  FLP 7/09 showed TChol 173, TG 76, HDL 57, LDL 101 ~  FLP 7/10 showed TChol 174, TG 94, HDL 57, LDL 99 ~  FLP 7/11 showed TChol 166, TG 90, HDL 58, LDL 91 ~  FLP 1/12 on Lip10 showed TChol 158, TG 91, HDL 53, LDL 86 ~  FLP 4/13 on Lip10 showed TChol 223, TG 90, HDL 62, LDL 140; what happened? Call to pharm revealed that she hasn't refilled Lip10 in 5months! Rec to take daily...  Hx of THYROID NODULE (ICD-241.0) - she has multinodular thyroid, no palp nodules, clinically euthyroid & doing well...  ~  labs 8/08 showed TSH = 0.87 ~  labs 7/09 showed TSH = 0.70 ~  labs 7/10 showed TSH= 1.12 ~  labs 1/11 showed TSH= 1.06 ~  labs 1/12 showed TSH= 1.53 ~  Labs 12/12 showed TSH= 1.08, FreeT3= 2.5 (2.3-4.2). FreeT4= 0.66 (0.60-1.60)  HIATAL HERNIA (ICD-553.3) - on NEXIUM 40mg /d... HH on prev CTscans... EGD in 1998 w/ distal esoph stricture dilated... no recurrent dysphagia etc... ~  last EGD 7/09 by DrStark showed 4cmHH, GERD, gastritis & gastic polyps- neg HPylori... Rx w/ NEXIUM 40mg Bid ==> then weaned to one per day.  DIVERTICULOSIS OF COLON (ICD-562.10), & COLONIC POLYPS, ADENOMATOUS, HX OF (ICD-V12.72) - colonoscopy 5/05 by DrStark w/ several sm 3-4 mm polyps, divertics, & f/u planned 30yrs... hosp in 2006 w/ divertic bleed that resolved spont... repeat hosp and f/u colonoscopy 7/09 showed divertics, and several 4-69mm adenomatous polyps... ~  11/11:  eval by DrStark for diarrhea & fecal incontinence> he rec refer to [facility], she declined &  says she improved w/ METAMUCIL daily. ~  5/12:  She sought second opinion GI from DrEdwards> colonoscopy w/ divertics, hems, no colitis; Asked to decr milk products, given Hyoscyamine 0.125mg  SL Qid prn... ~  CT Abd 7/13 showed LLQ Spigelian hernia on CT Abd per DrNeal; pt referred to [facility] & DrWilson plans hernia repair soon...  Pt concern for weight loss >> despite good appetite, eating well she says, taking Boost supplements Bid... ~  Prev GI evals from DrStark, then DrJEdwards as above... ~  7/12:  Weight = 177# ~  12/12:  Weight = 166# ~  CT Chest/Abd/Pelvis 12/12 showed cardiomeg, clear lungs & no lesions in the chest; Divertics & otherw no signif lesions in the Abd... ~  4/13:  Weight = 163#; Pt still c/o wt loss & we will refer back to GI for further assessment & on-going management... ~  CT Abd&Pelvis 7/13 by DrRNeal showed cardiomegaly, prob abd wall hernia in LLQ c/w Spigelian hernia w/ 5-6cm hernia sac, numerous divertics (no complic), calcif in Ao & iliacs, s/p hyst/ GB, multilevel DDD, left posterior urinary bladder divertic, NO MASSES etc... ~  10/13:  Weight is down to 148# still w/ pretty good appetite, eating 3 meals + Boost etc...  DEGENERATIVE JOINT DISEASE (ICD-715.90) - followed by DrDean w/ severe DJD, s/p bilat TKRs... S/P Left TKR in 12/04 by DrDean & Right TKR 7/02 by DrKrege... known CSpine and L/S spine disease... s/p recent epid steroid shot in lower back  w/ improvement... we refilled her MOBIC 7.5mg  for as needed use. ~  8/11:  she fell & saw DrDean & DrNewton for pain & paresthesias in left arm & improved w/ CSpine injection.  BACK PAIN, LUMBAR (ICD-724.2) >> she has known multilevel DDD w/ disc/osteophyte complexes, retrolisthesis, etc (see CT Abd 7/13)...  VITAMIN D DEFICIENCY (ICD-268.9) - Vit D level Jul09 = 19 & started on Vit D 50K weekly... ~  labs 7/10 showed Vit D level = 33... OK to switch to OTC Vit D 1000 u daily, also takes MVI & Calcium. ~  labs 1/12  showed Vit D level = 26... rec incr OTC supplement to 2000 u daily. ~  Labs 12/12 showed Vit D level = 35... rec to continue 2000u daily.  ABNORMALITY OF GAIT (ICD-781.2)  ANXIETY (ICD-300.00) - she is under the care of DrPlovsky for Psychiatry now on EFFEXOR XR 75mg - 2 tabs Qam, & LORAZEPAM 0.5mg  as needed... she was prev on Wellbutrin & Celexa, but DrPlovsky makes freq changes.  ANEMIA (ICD-285.9) - after her diverticular bleed in 2006... on Fe therapy w/ resolution... ~  labs 8/08 showed Hg= 12.8 ~  labs 7/10 showed Hg= 13.2 ~  labs 1/11 showed Hg= 13.9, Fe= 66 (16%sat) ~  labs 1/12 showed Hg= 13.5 ~  Labs 12/12 showed Hg= 14.0  HEALTH MAINTENANCE:  She gets the annual Flu vaccines each fall;  Given TDAP 7/12...   Past Surgical History  Procedure Laterality Date  . Appendectomy    . Cholecystectomy    . Abdominal hysterectomy    . Total knee arthroplasty      left  . Total knee arthroplasty      right  . Cataract extraction    . Inguinal hernia repair  05/21/2012    WITH MESH   . Ventral hernia repair  05/21/2012    Procedure: LAPAROSCOPIC VENTRAL HERNIA;  Surgeon: Atilano Ina, MD,FACS;  Location: [facility] OR;  Service: General;  Laterality: N/A;    Outpatient Encounter Prescriptions as of 10/12/2012  Medication Sig Dispense Refill  . atorvastatin (LIPITOR) 10 MG tablet Take 10 mg by mouth daily.      . dorzolamide-timolol (COSOPT) 22.3-6.8 MG/ML ophthalmic solution Place 1 drop into both eyes Three times a day.      . esomeprazole (NEXIUM) 40 MG capsule Take 1 capsule (40 mg total) by mouth daily.  30 capsule  3  . felodipine (PLENDIL) 5 MG 24 hr tablet Take 5 mg by mouth daily.      [name] lactose free nutrition (BOOST) LIQD 1 drink 2-3 times daily      . LORazepam (ATIVAN) 0.5 MG tablet Take 0.5 mg by mouth at bedtime.      [name] losartan-hydrochlorothiazide (HYZAAR) 100-25 MG per tablet Take 1 tablet by mouth daily.      . meloxicam (MOBIC) 7.5 MG tablet Take 7.5 mg by mouth daily  as needed. For arthritis pain      . metoprolol succinate (TOPROL-XL) 50 MG 24 hr tablet TAKE 1 TABLET EVERY DAY  30 tablet  2  . Multiple Vitamins-Minerals (CENTRUM SILVER ULTRA WOMENS PO) Take 1 tablet by mouth daily.      . phenazopyridine (PYRIDIUM) 200 MG tablet Take 1 tablet (200 mg total) by mouth 3 (three) times daily as needed for pain.  15 tablet  0  . sertraline (ZOLOFT) 50 MG tablet Take 1.5 tablets (75 mg total) by mouth daily.  30 tablet  5   No facility-administered  encounter medications on file as of 10/12/2012.    Allergies  Allergen Reactions  . Amoxicillin Itching    Chest and back  . Cephalexin Itching    Chest and back  . Penicillins Itching    Chest and back    Current Medications, Allergies, Past Medical History, Past Surgical History, Family History, and Social History were reviewed in [employer] record.     Review of Systems         Constitutional:   No   night sweats,  Fevers, chills,  +fatigue, or  lassitude.  HEENT:   No headaches,  Difficulty swallowing,  Tooth/dental problems, or  Sore throat,                No sneezing, itching, ear ache,  +nasal congestion, post nasal drip,   CV:  No chest pain,  Orthopnea, PND, swelling in lower extremities, anasarca, dizziness, palpitations, syncope.   GI  No heartburn, indigestion, abdominal pain, nausea, vomiting, diarrhea, change in bowel habits, loss of appetite, bloody stools.   Resp:    No chest wall deformity  Skin: no rash or lesions.  GU: no dysuria, change in color of urine, no urgency or frequency.  No flank pain, no hematuria   MS:  No joint pain or swelling.  No decreased range of motion.  No back pain.  Psych:     No memory loss.        Objective:   Physical Exam      WD, Overweight, 79 y/o BF in NAD.Marland KitchenMarland Kitchen 145 lbs >143 >138  GENERAL:  Alert, pleasant & cooperative...  HEENT:  [place]/AT,  , EACs-clear, TMs-wnl, NOSE-clear, THROAT-clear & wnl. NECK:  Supple w/  fairROM; no JVD; normal carotid impulses w/o bruits;  , no lymphadenopathy. CHEST:  Clear to P & A; without wheezes/ rales/ or rhonchi heard... HEART:  Regular Rhythm; without murmurs/ rubs/ or gallops detected... ABDOMEN:  Soft & nontender; normal bowel sounds; no organomegaly or masses palpated... EXT:  s/p bilat TKRs, mod arthritic changes; no varicose veins/ +venous insuffic/ tr edema. NEURO:  CN's intact; no focal neuro deficits... DERM:  No lesions noted; no rash etc..[name]

## 2012-10-12 NOTE — Assessment & Plan Note (Signed)
Persistent weight loss ? Etiology -? Depression  Extensive workup has been unrevealing w/ endo/colon, CT chest /abd/pelvis , cxr and labs w/ thyroid panel  Will hold on megace for now as her weight is ideal for her Height.  If on return still dropping wt  will need to consider adding megace.   Plan  Increase Zoloft 100mg  daily  Do not skip meals.  Change to High calorie boost between meals.  Follow up 2 months with Dr. Kriste Basque   Please contact office for sooner follow up if symptoms do not improve or worsen or seek emergency care

## 2012-10-12 NOTE — Assessment & Plan Note (Signed)
Anxiety and Depression   Plan  Increase Zoloft 100mg  daily   Follow up 2 months with Dr. Kriste Basque   Please contact office for sooner follow up if symptoms do not improve or worsen or seek emergency care

## 2012-10-15 ENCOUNTER — Ambulatory Visit (INDEPENDENT_AMBULATORY_CARE_PROVIDER_SITE_OTHER): Payer: Medicare Other | Admitting: Cardiovascular Disease

## 2012-10-15 VITALS — BP 130/70 | HR 62 | Ht 65.0 in | Wt 136.0 lb

## 2012-10-15 DIAGNOSIS — I491 Atrial premature depolarization: Secondary | ICD-10-CM

## 2012-10-15 DIAGNOSIS — I10 Essential (primary) hypertension: Secondary | ICD-10-CM

## 2012-10-15 DIAGNOSIS — E7800 Pure hypercholesterolemia, unspecified: Secondary | ICD-10-CM

## 2012-10-15 NOTE — Assessment & Plan Note (Signed)
Cholesterol is at goal.  Continue current dose of statin and diet Rx.  No myalgias or side effects.  F/U  LFT's in 6 months. Lab Results  Component Value Date   LDLCALC 86 08/16/2010

## 2012-10-15 NOTE — Patient Instructions (Signed)
Your physician recommends that you schedule a follow-up appointment in: AS NEEDED  Your physician recommends that you continue on your current medications as directed. Please refer to the Current Medication list given to you today.  

## 2012-10-15 NOTE — Progress Notes (Signed)
Patient ID: [name], female   DOB: 1928/04/17, 79 y.o.   MRN: [id number] [name] is seen today for F/U of HTN, elevated lipids and palpitations. Event monitor reviewed and showed only benign PAC's and PVC's. There was no afib. She had a normal cath in 2006 and non-ischemic myhovue in 2007. She is tolerating lipitor with no myalgias. She's had some arthritic hip and neck pain that she just saw Tammy Parret for. She is not having any SSCP, dyspnea, she ambulates well with a cane and she is having less palpitations on Toprol  Health nurse noted irregular pusle Patient has had these chronic PAC;s and PVC;s for years  ROS: Denies fever, malais, weight loss, blurry vision, decreased visual acuity, cough, sputum, SOB, hemoptysis, pleuritic pain, palpitaitons, heartburn, abdominal pain, melena, lower extremity edema, claudication, or rash.  All other systems reviewed and negative  General: Affect appropriate Healthy:  appears stated age HEENT: normal Neck supple with no adenopathy JVP normal no bruits no thyromegaly Lungs clear with no wheezing and good diaphragmatic motion Heart:  S1/S2 no murmur, no rub, gallop or click PMI normal Abdomen: benighn, BS positve, no tenderness, no AAA no bruit.  No HSM or HJR Distal pulses intact with no bruits No edema Neuro non-focal Skin warm and dry No muscular weakness   Current Outpatient Prescriptions  Medication Sig Dispense Refill  . atorvastatin (LIPITOR) 10 MG tablet Take 10 mg by mouth daily.      . dorzolamide-timolol (COSOPT) 22.3-6.8 MG/ML ophthalmic solution Place 1 drop into both eyes Three times a day.      . esomeprazole (NEXIUM) 40 MG capsule Take 1 capsule (40 mg total) by mouth daily.  30 capsule  3  . felodipine (PLENDIL) 5 MG 24 hr tablet Take 5 mg by mouth daily.      [name] lactose free nutrition (BOOST) LIQD 1 drink 2-3 times daily      . LORazepam (ATIVAN) 0.5 MG tablet Take 0.5 mg by mouth at bedtime.      [name]  losartan-hydrochlorothiazide (HYZAAR) 100-25 MG per tablet Take 1 tablet by mouth daily.      . meloxicam (MOBIC) 7.5 MG tablet Take 7.5 mg by mouth daily as needed. For arthritis pain      . metoprolol succinate (TOPROL-XL) 50 MG 24 hr tablet TAKE 1 TABLET EVERY DAY  30 tablet  2  . Multiple Vitamins-Minerals (CENTRUM SILVER ULTRA WOMENS PO) Take 1 tablet by mouth daily.      . phenazopyridine (PYRIDIUM) 200 MG tablet Take 1 tablet (200 mg total) by mouth 3 (three) times daily as needed for pain.  15 tablet  0  . sertraline (ZOLOFT) 100 MG tablet Take 1 tablet (100 mg total) by mouth daily.  30 tablet  5   No current facility-administered medications for this visit.    Allergies  Amoxicillin; Cephalexin; and Penicillins  Electrocardiogram:  Assessment and Plan

## 2012-10-15 NOTE — Assessment & Plan Note (Signed)
Well controlled.  Continue current medications and low sodium Dash type diet.    

## 2012-10-15 NOTE — Assessment & Plan Note (Signed)
Asymptomatic PAC; and PVC negative cardiac w/u in past Asymptomatic continue beta blocker

## 2012-10-17 ENCOUNTER — Other Ambulatory Visit: Payer: Self-pay | Admitting: Pulmonary Disease

## 2012-10-30 ENCOUNTER — Other Ambulatory Visit: Payer: Self-pay | Admitting: Pulmonary Disease

## 2012-12-15 ENCOUNTER — Ambulatory Visit (INDEPENDENT_AMBULATORY_CARE_PROVIDER_SITE_OTHER): Payer: Medicare Other | Admitting: Pulmonary Disease

## 2012-12-15 ENCOUNTER — Encounter: Payer: Self-pay | Admitting: Pulmonary Disease

## 2012-12-15 VITALS — BP 108/64 | HR 56 | Temp 98.5°F | Ht 65.5 in | Wt 133.4 lb

## 2012-12-15 DIAGNOSIS — E7800 Pure hypercholesterolemia, unspecified: Secondary | ICD-10-CM

## 2012-12-15 DIAGNOSIS — J42 Unspecified chronic bronchitis: Secondary | ICD-10-CM

## 2012-12-15 DIAGNOSIS — M545 Low back pain: Secondary | ICD-10-CM

## 2012-12-15 DIAGNOSIS — I2510 Atherosclerotic heart disease of native coronary artery without angina pectoris: Secondary | ICD-10-CM

## 2012-12-15 DIAGNOSIS — E041 Nontoxic single thyroid nodule: Secondary | ICD-10-CM

## 2012-12-15 DIAGNOSIS — I10 Essential (primary) hypertension: Secondary | ICD-10-CM

## 2012-12-15 DIAGNOSIS — K5730 Diverticulosis of large intestine without perforation or abscess without bleeding: Secondary | ICD-10-CM

## 2012-12-15 DIAGNOSIS — M1990 Unspecified osteoarthritis, unspecified site: Secondary | ICD-10-CM

## 2012-12-15 DIAGNOSIS — E559 Vitamin D deficiency, unspecified: Secondary | ICD-10-CM

## 2012-12-15 DIAGNOSIS — K449 Diaphragmatic hernia without obstruction or gangrene: Secondary | ICD-10-CM

## 2012-12-15 DIAGNOSIS — Z86010 Personal history of colonic polyps: Secondary | ICD-10-CM

## 2012-12-15 DIAGNOSIS — R634 Abnormal weight loss: Secondary | ICD-10-CM

## 2012-12-15 DIAGNOSIS — G4733 Obstructive sleep apnea (adult) (pediatric): Secondary | ICD-10-CM

## 2012-12-15 DIAGNOSIS — F411 Generalized anxiety disorder: Secondary | ICD-10-CM

## 2012-12-15 MED ORDER — DONEPEZIL HCL 10 MG PO TABS: 10.0000 mg | ORAL_TABLET | Freq: Every day | ORAL | Status: DC

## 2012-12-15 MED ORDER — DONEPEZIL HCL 5 MG PO TABS: 5.0000 mg | ORAL_TABLET | Freq: Every evening | ORAL | Status: DC | PRN

## 2012-12-15 NOTE — Progress Notes (Signed)
Subjective:    Patient ID: [name], female    DOB: April 16, 1928, 79 y.o.   MRN: [id number]  HPI 79 y/o BF here for a 6 month follow up visit...  she has multiple medical problems as noted below...  Followed for general medical purposes w/ hx of OSA, HBP, CAD, Hyperchol, Multinod thyroid, HH, Divertics & polyps, DJD, Anxiety, etc...  ~  July 11, 2011:  33mo ROV & add-on for wt loss> pt called very concerned about losing "20lbs in 3 months" but on eval today it is only 11# in 33months from 177==>166# w/ her 65" height her current BMI is 27.5;  She notes good appetite, usually eats 3 meals/d, and denies any GI symptoms- w/o jaw prob, swallowing difficulty, abd pain, N/V, D/C/blood, etc; she had colonoscopy 5/12 by DrEdwards w/ divertics, hems, no polyps, no lesions; still she is quite concerned & we outlined an evaluation including labs & CT Chest/ Abd/ Pelvis to address her concerns and r/o underlying/ hidden reason for her wt loss...    BP well regulated on meds & she denies CP, palpit, dizzy, SOB, edema, etc;  Chol looks good on Lip10;  She has thyroid nodule but has been clinically & biochemically euthyroid;  She notes a little arthritis pain in her neck & wants refill Mobic;  NOTE:  LABS ret w/ K=3.0 on her Hyzaar 100-25 & K20Bid> I called [facility] & they note Hyzaar filled 10/31 #30 & 9/15 #30, but K20 last filled 09/01/09- we called in #60 1Bid (pt asked again to bring all meds to every visit);  See prob list below>>  ~  November 11, 2011:  45mo ROV & [name] continues to complain of weight loss (wt=163# down 3# further in the last 45mo) despite drinking Boost Bid she says; reminded to eat 3 meals regularly & use the supplement betw meals as a "medication";  She intermittently & irregularly c/o abd discomfort- sometimes when she eats, sometimes in LLQ area; Denies n/v, dysphagia, reflux symptoms, change in bowels or blood seen; she sees DrJEdwards for GI> as noted above- she had colonoscopy  5/12 by DrEdwards w/ divertics, hems, no polyps, no lesions; and we did CT Chest/ Abd/ Pelvis 12/12 w/ cardiomeg, divertics,but otherw neg & no lesions seen... ?she wants further eval & we discussed referral back to GI for their review & recommendations>> (Appt sched for 4/11 at 10:30AM)...    During last OV her potassium was low at 3.0 & call to Pharm indicated she wasn't filling her KCl supplement (on Hyzaar for BP- see below); we called in K20Bid & she tells me she is taking it properly ever since> K=3.8 today, continue same...    LABS 4/13:  FLP on Lip10 showed TChol 223, TG 90, HDL 62, LDL 140 (I called Pharm- hasn't refilled in 5 months!);  Chems- wnl w/ K=3.8 on K20Bid...  ~  May 12, 2012:  84mo ROV & DrEWilson, [facility] plans spigelian hernia repair soon;  Weight is down to 148#, still w/ pretty good appetite, eating 3 meals + Boost etc... She has no specific complaints & her pre-op labs all look good...    We reviewed prob list, meds, xrays and labs> see below for updates >>  CXR 10/13 showed heart at upper lim of norm, lungs clear, NAD.Marland KitchenMarland Kitchen EKG 10/13 showed Sinus arrhythmia, rate63, incr voltage, NSSTTWA, no change from prev... LABS 10/13:  Chems- wnl w/ creat=0.91;  CBC- wnl w/ Hg=13.8 & Alb=3.5;  TFTs= wnl, euthyroid...   ~  Dec 15, 2012:  8mo ROV & [name] states "doin pretty good- I can't complain" but notes issue w/ her memory (see below) & we discussed starting Aricept5 today; she lives alone & next of kin are 2 grandkids but they are not local & not involved, she has a cousin that helps; she has lost weight down to 133# (down 15# in the interval) & states that appetite is fair- asked to incr the nutritional supplements, denies pain anywhere, etc...  We reviewed the following medical problems during today's office visit >>     HBP> on MetopER50, Plendil5, & Hyzaar100-25; BP= 108/64 w/ her wt loss 7 we decided to decr the Hyzaar to 1/2 tab daily...    CAD> stable on above meds (she is intol to  ASA) w/o CP, palpit, ch in SOB, edema, etc; prev cath 2006 w/ min CAD & focal 30%CIRC; prev hx palpit w/ holter showing PACs & PVCs only; she knows to avoid caffeine...    CHOL> on Lip10; reminded to take her meds daily & bring bottles to each OV for review; she will ret fasting for labs...    Multinod Thyroid> no palp nodules & clinically euthyroid; last labs also wnl & she will ret for f/u blood work...    GI- HH, Divertics, Polyps> on Nexium40; she has seen DrStark & DrJEdwards; CT Abd 7/13 showed LLQ spigelian hernia- eval by [facility] w/ laparoscopic repair 10/13 by DrEWilson & no complications...    Wt Loss> she has had extensive evals & hx reviewed by TP over several visits this yr- ?poss related to Effexor Rx (prev depression rx by [employer]) & she was switched to Zoloft, taking supplements regularly, got a new puppy...    DJD- s/p bilat TKRs> also w/ hx LBP & prev epid steroid injections; followed by DrDean & on Mobic7.5 prn...    Vit D Defic> reminded to continue VitD 2000u daily OTC supplement...    Memory Loss> she mentioned this 5/14 & MMSE w/ 25/30 & we rec starting Aricept5...    Anxiety & Depression> she lives alone, has a new puppy, no close relatives; on Ativan0.5 prn & Zoloft100mg /d... We reviewed prob list, meds, xrays and labs> see below for updates >> she did not bring med bottles to the OV today... SHE WILL NEED f/u FASTING LABS ON RETURN...           Problem List:       GLAUCOMA - on eye drops from DrShapiro...  HEARING LOSS - saw DrByers for hearing eval & they discussed hearing aides.  CHRONIC OSTEOMYELITIS OTHER SPECIFIED SITES (ICD-730.18) - she developed osteomyelitis of the right mandible 2009 related to a tooth abcess and path showed bact colonies consistant w/ Actinomyces- oral surg= DrRehm & seen by ID- DrFitzgerald and was treated w/ Clindamycin (due to Pen allergy)...  she stopped the Clindamycin on her own... last seen by DrFitzgerald 4/10 (71mo off Clinda) and doing  satis- he released her... still follows up w/ DrRehm for OralSurg...  OBSTRUCTIVE SLEEP APNEA (ICD-327.23) - mod OSA w/ sleep study 8/04 showing RDI=23, consult DrClance... Rx CPAP 10... states she is using CPAP regularly and tol well...  BRONCHITIS, RECURRENT (ICD-491.9) - no recent upper resp infection... ~  CXR 1/11 showed clear lungs, WNL.[name]. ~  CT Chest (along w/ Abd/Pelvis) 12/12 due to c/o wt loss showed cardiomegaly, clear lungs, NAD...  HYPERTENSION (ICD-401.9) - controlled on TOPROL XL 50mg /d, PLENDIL 5mg /d, HYZAAR 100/25 daily, & KCl Bid...  ~  7/12:  BP=136/74 and asymptomatic- denies HA, fatigue, visual changes, CP, palipit, dizziness, syncope, dyspnea, edema, etc... she has a long hx of chr noncompliance w/ med Rx... ~  12/12:  BP= 124/82 and she notes wt loss & some weakness; Labs revealed K=3.0 & pharm confirms she hasn't filled K20 since 1/11> asked to bring all meds bottles to every visit & we called in K20 1Bid #60... ~  4/13:  BP= 132/88 & she confirms taking meds properly + the K20Bid; LABS showed K=3.8, Renal= wnl, Creat=0.9; rec to continue same meds... ~  10/13:  BP= 122/70 & she remains asymptomatic... ~  5/14:  on MetopER50, Plendil5, & Hyzaar100-25; BP= 108/64 w/ her wt loss 7 we decided to decr the Hyzaar to 1/2 tab daily  CAD (ICD-414.00) - non-obstructive disease w/ cath 1/06 showing 30% focal stenosis in CIRC... NuclearStressTest 9/07 showed apical thinning, no ischemia, EF=69%... followed by Walker Kehr for her HBP, palpit, lipids> Event recoder showed benign PVCs & PACs, no change in med Rx... she can't take ASA, she says. ~  3/14:  She had f/u DrNishan> doing satis & no changes made in her meds...  HYPERCHOLESTEROLEMIA (ICD-272.0) - on LIPITOR 10mg /d...  ~  FLP 8/08 w/ TChol 174, TG 76, HDL 65, LDL 94 ~  FLP 7/09 showed TChol 173, TG 76, HDL 57, LDL 101 ~  FLP 7/10 showed TChol 174, TG 94, HDL 57, LDL 99 ~  FLP 7/11 showed TChol 166, TG 90, HDL 58, LDL 91 ~   FLP 1/12 on Lip10 showed TChol 158, TG 91, HDL 53, LDL 86 ~  FLP 4/13 on Lip10 showed TChol 223, TG 90, HDL 62, LDL 140; what happened? Call to pharm revealed that she hasn't refilled Lip10 in 5months! Rec to take daily...  Hx of THYROID NODULE (ICD-241.0) - she has multinodular thyroid, no palp nodules, clinically euthyroid & doing well...  ~  labs 8/08 showed TSH = 0.87 ~  labs 7/09 showed TSH = 0.70 ~  labs 7/10 showed TSH= 1.12 ~  labs 1/11 showed TSH= 1.06 ~  labs 1/12 showed TSH= 1.53 ~  Labs 12/12 showed TSH= 1.08, FreeT3= 2.5 (2.3-4.2). FreeT4= 0.66 (0.60-1.60)  HIATAL HERNIA (ICD-553.3) - on NEXIUM 40mg /d... HH on prev CTscans... EGD in 1998 w/ distal esoph stricture dilated... no recurrent dysphagia etc... ~  last EGD 7/09 by DrStark showed 4cmHH, GERD, gastritis & gastic polyps- neg HPylori... Rx w/ NEXIUM 40mg Bid ==> then weaned to one per day.  DIVERTICULOSIS OF COLON (ICD-562.10), & COLONIC POLYPS, ADENOMATOUS, HX OF (ICD-V12.72) - colonoscopy 5/05 by DrStark w/ several sm 3-4 mm polyps, divertics, & f/u planned 62yrs... hosp in 2006 w/ divertic bleed that resolved spont... repeat hosp and f/u colonoscopy 7/09 showed divertics, and several 4-9mm adenomatous polyps... ~  11/11:  eval by DrStark for diarrhea & fecal incontinence> he rec refer to [facility], she declined & says she improved w/ METAMUCIL daily. ~  5/12:  She sought second opinion GI from DrEdwards> colonoscopy w/ divertics, hems, no colitis; Asked to decr milk products, given Hyoscyamine 0.125mg  SL Qid prn... ~  CT Abd 7/13 showed LLQ Spigelian hernia on CT Abd per DrNeal; pt referred to [facility] & DrWilson plans hernia repair soon... ~  10/13: s/p laparoscopic hernia repair by DrEWilson; pt did well w/o complications...  Pt concern for weight loss >> despite good appetite, eating well she says, taking Boost supplements Bid... ~  Prev GI evals from DrStark, then DrJEdwards as above... ~  7/12:  Weight = 177# ~  12/12:  Weight  = 166# ~  CT Chest/Abd/Pelvis 12/12 showed cardiomeg, clear lungs & no lesions in the chest; Divertics & otherw no signif lesions in the Abd... ~  4/13:  Weight = 163#; Pt still c/o wt loss & we will refer back to GI for further assessment & on-going management... ~  CT Abd&Pelvis 7/13 by DrRNeal showed cardiomegaly, prob abd wall hernia in LLQ c/w Spigelian hernia w/ 5-6cm hernia sac, numerous divertics (no complic), calcif in Ao & iliacs, s/p hyst/ GB, multilevel DDD, left posterior urinary bladder divertic, NO MASSES etc... ~  10/13:  Weight is down to 148# still w/ pretty good appetite, eating 3 meals + Boost etc... ~  She has had several interval evaluations by TP w/ change in antidepressant meds etc- now on Zoloft100... ~  5/14:  Weight is down to 133# & she is encouraged to eat 3 meals/d & drinks supplement Tid as well...  DEGENERATIVE JOINT DISEASE (ICD-715.90) - followed by DrDean w/ severe DJD, s/p bilat TKRs... S/P Left TKR in 12/04 by DrDean & Right TKR 7/02 by DrKrege... known CSpine and L/S spine disease... s/p recent epid steroid shot in lower back w/ improvement... we refilled her MOBIC 7.5mg  for as needed use. ~  8/11:  she fell & saw DrDean & DrNewton for pain & paresthesias in left arm & improved w/ CSpine injection.  BACK PAIN, LUMBAR (ICD-724.2) >> she has known multilevel DDD w/ disc/osteophyte complexes, retrolisthesis, etc (see CT Abd 7/13)...  VITAMIN D DEFICIENCY (ICD-268.9) - Vit D level Jul09 = 19 & started on Vit D 50K weekly... ~  labs 7/10 showed Vit D level = 33... OK to switch to OTC Vit D 1000 u daily, also takes MVI & Calcium. ~  labs 1/12 showed Vit D level = 26... rec incr OTC supplement to 2000 u daily. ~  Labs 12/12 showed Vit D level = 35... rec to continue 2000u daily.  MEMORY LOSS >>  ~  5/14:  She mentioned memory loss & MMSE= 25/30, started on ARICEPT5...  ABNORMALITY OF GAIT (ICD-781.2)  ANXIETY (ICD-300.00) - she is under the care of DrPlovsky for  Psychiatry now on EFFEXOR XR 75mg - 2 tabs Qam, & LORAZEPAM 0.5mg  as needed... she was prev on Wellbutrin & Celexa, but DrPlovsky makes freq changes.  ANEMIA (ICD-285.9) - after her diverticular bleed in 2006... on Fe therapy w/ resolution... ~  labs 8/08 showed Hg= 12.8 ~  labs 7/10 showed Hg= 13.2 ~  labs 1/11 showed Hg= 13.9, Fe= 66 (16%sat) ~  labs 1/12 showed Hg= 13.5 ~  Labs 12/12 showed Hg= 14.0  HEALTH MAINTENANCE:  She gets the annual Flu vaccines each fall;  Given TDAP 7/12...   Past Surgical History  Procedure Laterality Date  . Appendectomy    . Cholecystectomy    . Abdominal hysterectomy    . Total knee arthroplasty      left  . Total knee arthroplasty      right  . Cataract extraction    . Inguinal hernia repair  05/21/2012    WITH MESH   . Ventral hernia repair  05/21/2012    Procedure: LAPAROSCOPIC VENTRAL HERNIA;  Surgeon: Atilano Ina, MD,FACS;  Location: [facility] OR;  Service: General;  Laterality: N/A;    Outpatient Encounter Prescriptions as of 12/15/2012  Medication Sig Dispense Refill  . atorvastatin (LIPITOR) 10 MG tablet Take 10 mg by mouth daily.      [name]  dorzolamide-timolol (COSOPT) 22.3-6.8 MG/ML ophthalmic solution Place 1 drop into both eyes Three times a day.      . esomeprazole (NEXIUM) 40 MG capsule Take 1 capsule (40 mg total) by mouth daily.  30 capsule  3  . felodipine (PLENDIL) 5 MG 24 hr tablet TAKE 1 TABLET (5 MG TOTAL) BY MOUTH DAILY.  30 tablet  5  . lactose free nutrition (BOOST) LIQD 1 drink 2-3 times daily      . LORazepam (ATIVAN) 0.5 MG tablet Take 0.5 mg by mouth at bedtime.      [name] losartan-hydrochlorothiazide (HYZAAR) 100-25 MG per tablet Take 1/2 tablet by mouth daily      . meloxicam (MOBIC) 7.5 MG tablet Take 7.5 mg by mouth daily as needed. For arthritis pain      . metoprolol succinate (TOPROL-XL) 50 MG 24 hr tablet TAKE 1 TABLET EVERY DAY  30 tablet  2  . Multiple Vitamins-Minerals (CENTRUM SILVER ULTRA WOMENS PO) Take 1 tablet by  mouth daily.      . phenazopyridine (PYRIDIUM) 200 MG tablet Take 1 tablet (200 mg total) by mouth 3 (three) times daily as needed for pain.  15 tablet  0  . sertraline (ZOLOFT) 100 MG tablet Take 1 tablet (100 mg total) by mouth daily.  30 tablet  5  . [DISCONTINUED] donepezil (ARICEPT) 10 MG tablet Take 10 mg by mouth at bedtime.      . [DISCONTINUED] donepezil (ARICEPT) 10 MG tablet Take 1 tablet (10 mg total) by mouth at bedtime.  30 tablet  11  . donepezil (ARICEPT) 5 MG tablet Take 1 tablet (5 mg total) by mouth at bedtime as needed.  30 tablet  11  . [DISCONTINUED] felodipine (PLENDIL) 5 MG 24 hr tablet Take 5 mg by mouth daily.       No facility-administered encounter medications on file as of 12/15/2012.    Allergies  Allergen Reactions  . Amoxicillin Itching    Chest and back  . Cephalexin Itching    Chest and back  . Penicillins Itching    Chest and back    Current Medications, Allergies, Past Medical History, Past Surgical History, Family History, and Social History were reviewed in [employer] record.     Review of Systems         See HPI - all other systems neg except as noted...   The patient complains of dyspnea on exertion and difficulty walking.  The patient denies anorexia, fever, weight loss, weight gain, vision loss, decreased hearing, hoarseness, chest pain, syncope, peripheral edema, prolonged cough, headaches, hemoptysis, abdominal pain, melena, hematochezia, severe indigestion/heartburn, hematuria, incontinence, muscle weakness, suspicious skin lesions, transient blindness, depression, unusual weight change, abnormal bleeding, enlarged lymph nodes, and angioedema.     Objective:   Physical Exam      WD, Overweight, 79 y/o BF in NAD... GENERAL:  Alert, pleasant & cooperative... I did not do formal MMSE... HEENT:  [place]/AT, EOM-wnl, PERRLA, EACs-clear, TMs-wnl, NOSE-clear, THROAT-clear & wnl, JAW- no pain or tenderness. NECK:  Supple w/  fairROM; no JVD; normal carotid impulses w/o bruits; no thyromegaly or nodules palpated (cannot feel the multinod goiter), no lymphadenopathy. CHEST:  Clear to P & A; without wheezes/ rales/ or rhonchi heard... HEART:  Regular Rhythm; without murmurs/ rubs/ or gallops detected... ABDOMEN:  Soft & nontender; normal bowel sounds; no organomegaly or masses palpated... EXT:  s/p bilat TKRs, mod arthritic changes; no varicose veins/ +venous insuffic/ tr edema. NEURO:  CN's intact; no focal neuro deficits... DERM:  No lesions noted; no rash etc...  RADIOLOGY DATA:  Reviewed in the EPIC EMR & discussed w/ the patient...  LABORATORY DATA:  Reviewed in the EPIC EMR & discussed w/ the patient...   Assessment & Plan:    MEMORY LOSS>  We decided to start ARICEPT5mg /d...  Weight Loss>  No obvious cause on our eval as above; she is concerned & has had thorough evaluations by GI, GYN, etc; encouraged 3 meals/d + 3 cans Boost per day...  HBP>  Controlled on Toprol, Plendil, Hyzaar; but BP sl low w/ her wt loss- therefore decr the Hyzaar to 1/2 tab daily...  CAD>  Followed by Walker Kehr, stable on meds, no angina...  Palpit>  She had PACs & PVCs on Holter, symptoms improved w/ BBlocker, takes extra 1/4-1/2 as needed...  CHOL>  She was stable on Lip10 + diet Rx; Pharm confirms Lipitor not refilled in 32mo & FLP not at goal; asked to restart Lip10 & stay on it...  Multinod Thyroid>  Clinically & biochem euthyroid, not on meds...  GI>  eval by DrEdwards reviewed> HH, GERD, hx stricture, Divertics, Hems, Polyps; she has some wt loss & vague intermittent symptoms & he found LLQ hernia- s/p lap hernia repair by DrEWilson 10/13...  DJD>  Followed by DrDean, on Mobic prn, exercises by walking w/ "Keebler"...  Anxiety>  Prev followed by DrPlovsky on Lorazepam & Effexor changed to zoloft100...   Patient's Medications  New Prescriptions   DONEPEZIL (ARICEPT) 5 MG TABLET    Take 1 tablet (5 mg total) by mouth  at bedtime as needed.  Previous Medications   ATORVASTATIN (LIPITOR) 10 MG TABLET    Take 10 mg by mouth daily.   DORZOLAMIDE-TIMOLOL (COSOPT) 22.3-6.8 MG/ML OPHTHALMIC SOLUTION    Place 1 drop into both eyes Three times a day.   ESOMEPRAZOLE (NEXIUM) 40 MG CAPSULE    Take 1 capsule (40 mg total) by mouth daily.   FELODIPINE (PLENDIL) 5 MG 24 HR TABLET    TAKE 1 TABLET (5 MG TOTAL) BY MOUTH DAILY.   LACTOSE FREE NUTRITION (BOOST) LIQD    1 drink 2-3 times daily   LORAZEPAM (ATIVAN) 0.5 MG TABLET    Take 0.5 mg by mouth at bedtime.   LOSARTAN-HYDROCHLOROTHIAZIDE (HYZAAR) 100-25 MG PER TABLET    Take 1/2 tablet by mouth daily   MELOXICAM (MOBIC) 7.5 MG TABLET    Take 7.5 mg by mouth daily as needed. For arthritis pain   METOPROLOL SUCCINATE (TOPROL-XL) 50 MG 24 HR TABLET    TAKE 1 TABLET EVERY DAY   MULTIPLE VITAMINS-MINERALS (CENTRUM SILVER ULTRA WOMENS PO)    Take 1 tablet by mouth daily.   PHENAZOPYRIDINE (PYRIDIUM) 200 MG TABLET    Take 1 tablet (200 mg total) by mouth 3 (three) times daily as needed for pain.   SERTRALINE (ZOLOFT) 100 MG TABLET    Take 1 tablet (100 mg total) by mouth daily.  Modified Medications   No medications on file  Discontinued Medications

## 2012-12-15 NOTE — Patient Instructions (Addendum)
Today we updated your med list in our EPIC system...    We decided to DECREASE your HYZAAR (Losartan/HCT) to 1/2 tab daily...  We added ARICEPT (Donepizil) 5mg  daily for your memory...  Call for any questions...  ]Let's plan a follow up visit in 30mo, sooner if needed for problems.Marland KitchenMarland Kitchen

## 2012-12-23 ENCOUNTER — Other Ambulatory Visit: Payer: Self-pay | Admitting: Pulmonary Disease

## 2012-12-23 DIAGNOSIS — R159 Full incontinence of feces: Secondary | ICD-10-CM

## 2012-12-24 ENCOUNTER — Telehealth: Payer: Self-pay | Admitting: Gastroenterology

## 2012-12-24 ENCOUNTER — Telehealth: Payer: Self-pay | Admitting: Pulmonary Disease

## 2012-12-24 NOTE — Telephone Encounter (Signed)
I advised pt I would let her Referring MD know she transferred her GI Care somewhere else.

## 2012-12-25 NOTE — Telephone Encounter (Signed)
Referral and records were faxed but will be refaxed today Tobe Sos

## 2012-12-29 ENCOUNTER — Other Ambulatory Visit: Payer: Self-pay | Admitting: Adult Health

## 2013-01-11 ENCOUNTER — Other Ambulatory Visit: Payer: Self-pay | Admitting: Pulmonary Disease

## 2013-01-18 ENCOUNTER — Ambulatory Visit (INDEPENDENT_AMBULATORY_CARE_PROVIDER_SITE_OTHER): Payer: Medicare Other | Admitting: Pulmonary Disease

## 2013-01-18 ENCOUNTER — Telehealth: Payer: Self-pay | Admitting: Pulmonary Disease

## 2013-01-18 ENCOUNTER — Encounter: Payer: Self-pay | Admitting: Pulmonary Disease

## 2013-01-18 VITALS — BP 142/84 | HR 53 | Temp 98.6°F | Ht 63.0 in | Wt 132.0 lb

## 2013-01-18 DIAGNOSIS — F411 Generalized anxiety disorder: Secondary | ICD-10-CM

## 2013-01-18 DIAGNOSIS — I2510 Atherosclerotic heart disease of native coronary artery without angina pectoris: Secondary | ICD-10-CM

## 2013-01-18 DIAGNOSIS — R634 Abnormal weight loss: Secondary | ICD-10-CM

## 2013-01-18 DIAGNOSIS — M545 Low back pain: Secondary | ICD-10-CM

## 2013-01-18 DIAGNOSIS — E559 Vitamin D deficiency, unspecified: Secondary | ICD-10-CM

## 2013-01-18 DIAGNOSIS — E7800 Pure hypercholesterolemia, unspecified: Secondary | ICD-10-CM

## 2013-01-18 DIAGNOSIS — H409 Unspecified glaucoma: Secondary | ICD-10-CM

## 2013-01-18 DIAGNOSIS — M1990 Unspecified osteoarthritis, unspecified site: Secondary | ICD-10-CM

## 2013-01-18 DIAGNOSIS — I10 Essential (primary) hypertension: Secondary | ICD-10-CM

## 2013-01-18 DIAGNOSIS — K5730 Diverticulosis of large intestine without perforation or abscess without bleeding: Secondary | ICD-10-CM

## 2013-01-18 DIAGNOSIS — E041 Nontoxic single thyroid nodule: Secondary | ICD-10-CM

## 2013-01-18 DIAGNOSIS — G4733 Obstructive sleep apnea (adult) (pediatric): Secondary | ICD-10-CM

## 2013-01-18 DIAGNOSIS — K449 Diaphragmatic hernia without obstruction or gangrene: Secondary | ICD-10-CM

## 2013-01-18 DIAGNOSIS — Z86010 Personal history of colonic polyps: Secondary | ICD-10-CM

## 2013-01-18 NOTE — Telephone Encounter (Signed)
I spoke with pt. She stated she has a tick bite. She stated the tick is still on her. I advised her SN had a an opening at 10:00. She stated she would be here by that time. Appt scheduled. Nothing further was needed.

## 2013-01-18 NOTE — Progress Notes (Signed)
Subjective:    Patient ID: [name], female    DOB: 11-Jun-1928, 79 y.o.   MRN: [id number]  HPI 79 y/o BF here for a 6 month follow up visit...  she has multiple medical problems as noted below...  Followed for general medical purposes w/ hx of OSA, HBP, CAD, Hyperchol, Multinod thyroid, HH, Divertics & polyps, DJD, Anxiety, etc...  ~  July 11, 2011:  60mo ROV & add-on for wt loss> pt called very concerned about losing "20lbs in 3 months" but on eval today it is only 11# in 60months from 177==>166# w/ her 32" height her current BMI is 27.5;  She notes good appetite, usually eats 3 meals/d, and denies any GI symptoms- w/o jaw prob, swallowing difficulty, abd pain, N/V, D/C/blood, etc; she had colonoscopy 5/12 by [name] w/ divertics, hems, no polyps, no lesions; still she is quite concerned & we outlined an evaluation including labs & CT Chest/ Abd/ Pelvis to address her concerns and r/o underlying/ hidden reason for her wt loss...    BP well regulated on meds & she denies CP, palpit, dizzy, SOB, edema, etc;  Chol looks good on Lip10;  She has thyroid nodule but has been clinically & biochemically euthyroid;  She notes a little arthritis pain in her neck & wants refill Mobic;  NOTE:  LABS ret w/ K=3.0 on her Hyzaar 100-25 & K20Bid> [facility] & they note Hyzaar filled 10/31 #30 & 9/15 #30, but K20 last filled 09/01/09- we called in #60 1Bid (pt asked again to bring all meds to every visit);  See prob list below>>  ~  November 11, 2011:  42mo ROV & [name] continues to complain of weight loss (wt=163# down 3# further in the last 42mo) despite drinking Boost Bid she says; reminded to eat 3 meals regularly & use the supplement betw meals as a "medication";  She intermittently & irregularly c/o abd discomfort- sometimes when she eats, sometimes in LLQ area; Denies n/v, dysphagia, reflux symptoms, change in bowels or blood seen; she sees [name] for GI> as noted above- she had colonoscopy  5/12 by [name] w/ divertics, hems, no polyps, no lesions; and we did CT Chest/ Abd/ Pelvis 12/12 w/ cardiomeg, divertics,but otherw neg & no lesions seen... ?she wants further eval & we discussed referral back to GI for their review & recommendations>> (Appt sched for 4/11 at 10:30AM)...    During last OV her potassium was low at 3.0 & call to Pharm indicated she wasn't filling her KCl supplement (on Hyzaar for BP- see below); we called in K20Bid & she tells me she is taking it properly ever since> K=3.8 today, continue same...    LABS 4/13:  FLP on Lip10 showed TChol 223, TG 90, HDL 62, LDL 140 (I called Pharm- hasn't refilled in 5 months!);  Chems- wnl w/ K=3.8 on K20Bid...  ~  May 12, 2012:  71mo ROV & [name], [facility] plans spigelian hernia repair soon;  Weight is down to 148#, still w/ pretty good appetite, eating 3 meals + Boost etc... She has no specific complaints & her pre-op labs all look good...    We reviewed prob list, meds, xrays and labs> see below for updates >>  CXR 10/13 showed heart at upper lim of norm, lungs clear, NAD.Marland KitchenMarland Kitchen EKG 10/13 showed Sinus arrhythmia, rate63, incr voltage, NSSTTWA, no change from prev... LABS 10/13:  Chems- wnl w/ creat=0.91;  CBC- wnl w/ Hg=13.8 & Alb=3.5;  TFTs= wnl, euthyroid...   ~  Dec 15, 2012:  23mo ROV & [name] states "doin pretty good- I can't complain" but notes issue w/ her memory (see below) & we discussed starting Aricept5 today; she lives alone & next of kin are 2 grandkids but they are not local & not involved, she has a cousin that helps; she has lost weight down to 133# (down 15# in the interval) & states that appetite is fair- asked to incr the nutritional supplements, denies pain anywhere, etc...  We reviewed the following medical problems during today's office visit >>     HBP> on MetopER50, Plendil5, & Hyzaar100-25; BP= 108/64 w/ her wt loss 7 we decided to decr the Hyzaar to 1/2 tab daily...    CAD> stable on above meds (she is intol to  ASA) w/o CP, palpit, ch in SOB, edema, etc; prev cath 2006 w/ min CAD & focal 30%CIRC; prev hx palpit w/ holter showing PACs & PVCs only; she knows to avoid caffeine...    CHOL> on Lip10; reminded to take her meds daily & bring bottles to each OV for review; she will ret fasting for labs...    Multinod Thyroid> no palp nodules & clinically euthyroid; last labs also wnl & she will ret for f/u blood work...    GI- HH, Divertics, Polyps> on Nexium40; she has seen [name] & [name]; CT Abd 7/13 showed LLQ spigelian hernia- eval by [facility] w/ laparoscopic repair 10/13 by [name] & no complications...    Wt Loss> she has had extensive evals & hx reviewed by TP over several visits this yr- ?poss related to Effexor Rx (prev depression rx by [name]) & she was switched to Zoloft, taking supplements regularly, got a new puppy...    DJD- s/p bilat TKRs> also w/ hx LBP & prev epid steroid injections; followed by [name] & on Mobic7.5 prn...    Vit D Defic> reminded to continue VitD 2000u daily OTC supplement...    Memory Loss> she mentioned this 5/14 & MMSE w/ 25/30 & we rec starting Aricept5...    Anxiety & Depression> she lives alone, has a new puppy, no close relatives; on Ativan0.5 prn & Zoloft100mg /d... We reviewed prob list, meds, xrays and labs> see below for updates >> she did not bring med bottles to the OV today... SHE WILL NEED f/u FASTING LABS ON RETURN...  ~  January 18, 2013:  38mo ROV & add-on for tic bite> she found tic on her shoulder this AM & panicked- not attached, removed 7 destroyed; she denies f/c/s, aching soreness, malaise, or rash- she is reassured about the exposure... She is taking the Aricept5mg /d, she is lost 2# to 132# today & we discussed nutritional supplements and referral to [facility]...     We reviewed prob list, meds, xrays and labs> see below for updates >>  LABS 6/14:  She was asked to go to the lab for blood work (non-fasting) today but she forgot (again)             Problem List:       GLAUCOMA - on eye drops from [name]...  HEARING LOSS - saw DrByers for hearing eval & they discussed hearing aides.  CHRONIC OSTEOMYELITIS OTHER SPECIFIED SITES (ICD-730.18) - she developed osteomyelitis of the right mandible 2009 related to a tooth abcess and path showed bact colonies consistant w/ Actinomyces- oral surg= DrRehm & seen by ID- DrFitzgerald and was treated w/ Clindamycin (due to Pen allergy)...  she stopped the Clindamycin on her own... last seen by  DrFitzgerald 4/10 (62mo off Clinda) and doing satis- he released her... still follows up w/ DrRehm for OralSurg...  OBSTRUCTIVE SLEEP APNEA (ICD-327.23) - mod OSA w/ sleep study 8/04 showing RDI=23, consult DrClance... Rx CPAP 10... states she is using CPAP regularly and tol well...  BRONCHITIS, RECURRENT (ICD-491.9) - no recent upper resp infection... ~  CXR 1/11 showed clear lungs, WNL.[name]. ~  CT Chest (along w/ Abd/Pelvis) 12/12 due to c/o wt loss showed cardiomegaly, clear lungs, NAD...  HYPERTENSION (ICD-401.9) - controlled on TOPROL XL 50mg /d, PLENDIL 5mg /d, HYZAAR 100/25 daily, & KCl Bid...  ~  7/12: BP=136/74 and asymptomatic- denies HA, fatigue, visual changes, CP, palipit, dizziness, syncope, dyspnea, edema, etc... she has a long hx of chr noncompliance w/ med Rx... ~  12/12:  BP= 124/82 and she notes wt loss & some weakness; Labs revealed K=3.0 & pharm confirms she hasn't filled K20 since 1/11> asked to bring all meds bottles to every visit & we called in K20 1Bid #60... ~  4/13:  BP= 132/88 & she confirms taking meds properly + the K20Bid; LABS showed K=3.8, Renal= wnl, Creat=0.9; rec to continue same meds... ~  10/13:  BP= 122/70 & she remains asymptomatic... ~  5/14:  on MetopER50, Plendil5, & Hyzaar100-25; BP= 108/64 w/ her wt loss 7 we decided to decr the Hyzaar to 1/2 tab daily  CAD (ICD-414.00) - non-obstructive disease w/ cath 1/06 showing 30% focal stenosis in CIRC... NuclearStressTest  9/07 showed apical thinning, no ischemia, EF=69%... followed by Walker Kehr for her HBP, palpit, lipids> Event recoder showed benign PVCs & PACs, no change in med Rx... she can't take ASA, she says. ~  3/14:  She had f/u DrNishan> doing satis & no changes made in her meds...  HYPERCHOLESTEROLEMIA (ICD-272.0) - on LIPITOR 10mg /d...  ~  FLP 8/08 w/ TChol 174, TG 76, HDL 65, LDL 94 ~  FLP 7/09 showed TChol 173, TG 76, HDL 57, LDL 101 ~  FLP 7/10 showed TChol 174, TG 94, HDL 57, LDL 99 ~  FLP 7/11 showed TChol 166, TG 90, HDL 58, LDL 91 ~  FLP 1/12 on Lip10 showed TChol 158, TG 91, HDL 53, LDL 86 ~  FLP 4/13 on Lip10 showed TChol 223, TG 90, HDL 62, LDL 140; what happened? Call to pharm revealed that she hasn't refilled Lip10 in 5months! Rec to take daily...  Hx of THYROID NODULE (ICD-241.0) - she has multinodular thyroid, no palp nodules, clinically euthyroid & doing well...  ~  labs 8/08 showed TSH = 0.87 ~  labs 7/09 showed TSH = 0.70 ~  labs 7/10 showed TSH= 1.12 ~  labs 1/11 showed TSH= 1.06 ~  labs 1/12 showed TSH= 1.53 ~  Labs 12/12 showed TSH= 1.08, FreeT3= 2.5 (2.3-4.2). FreeT4= 0.66 (0.60-1.60)  HIATAL HERNIA (ICD-553.3) - on NEXIUM 40mg /d... HH on prev CTscans... EGD in 1998 w/ distal esoph stricture dilated... no recurrent dysphagia etc... ~  last EGD 7/09 by [name] showed 4cmHH, GERD, gastritis & gastic polyps- neg HPylori... Rx w/ NEXIUM 40mg Bid ==> then weaned to one per day.  DIVERTICULOSIS OF COLON (ICD-562.10), & COLONIC POLYPS, ADENOMATOUS, HX OF (ICD-V12.72) - colonoscopy 5/05 by [name] w/ several sm 3-4 mm polyps, divertics, & f/u planned 35yrs... hosp in 2006 w/ divertic bleed that resolved spont... repeat hosp and f/u colonoscopy 7/09 showed divertics, and several 4-35mm adenomatous polyps... ~  11/11:  eval by [name] for diarrhea & fecal incontinence> he rec refer to [facility], she declined & says  she improved w/ METAMUCIL daily. ~  5/12:  She sought second opinion GI from  [name]> colonoscopy w/ divertics, hems, no colitis; Asked to decr milk products, given Hyoscyamine 0.125mg  SL Qid prn... ~  CT Abd 7/13 showed LLQ Spigelian hernia on CT Abd per DrNeal; pt referred to [facility] & DrWilson plans hernia repair soon... ~  10/13: s/p laparoscopic hernia repair by [name]; pt did well w/o complications...  Pt concern for weight loss >> despite good appetite, eating well she says, taking Boost supplements Bid... ~  Prev GI evals from [name], then [name] as above... ~  7/12:  Weight = 177# ~  12/12:  Weight = 166# ~  CT Chest/Abd/Pelvis 12/12 showed cardiomeg, clear lungs & no lesions in the chest; Divertics & otherw no signif lesions in the Abd... ~  4/13:  Weight = 163#; Pt still c/o wt loss & we will refer back to GI for further assessment & on-going management... ~  CT Abd&Pelvis 7/13 by DrRNeal showed cardiomegaly, prob abd wall hernia in LLQ c/w Spigelian hernia w/ 5-6cm hernia sac, numerous divertics (no complic), calcif in Ao & iliacs, s/p hyst/ GB, multilevel DDD, left posterior urinary bladder divertic, NO MASSES etc... ~  10/13:  Weight is down to 148# still w/ pretty good appetite, eating 3 meals + Boost etc... ~  She has had several interval evaluations by TP w/ change in antidepressant meds etc- now on Zoloft100... ~  5/14:  Weight is down to 133# & she is encouraged to eat 3 meals/d & drinks supplement Tid as well...  DEGENERATIVE JOINT DISEASE (ICD-715.90) - followed by [name] w/ severe DJD, s/p bilat TKRs... S/P Left TKR in 12/04 by [name] & Right TKR 7/02 by DrKrege... known CSpine and L/S spine disease... s/p recent epid steroid shot in lower back w/ improvement... we refilled her MOBIC 7.5mg  for as needed use. ~  8/11:  she fell & saw [name] & DrNewton for pain & paresthesias in left arm & improved w/ CSpine injection.  BACK PAIN, LUMBAR (ICD-724.2) >> she has known multilevel DDD w/ disc/osteophyte complexes, retrolisthesis, etc (see CT Abd  7/13)...  VITAMIN D DEFICIENCY (ICD-268.9) - Vit D level Jul09 = 19 & started on Vit D 50K weekly... ~  labs 7/10 showed Vit D level = 33... OK to switch to OTC Vit D 1000 u daily, also takes MVI & Calcium. ~  labs 1/12 showed Vit D level = 26... rec incr OTC supplement to 2000 u daily. ~  Labs 12/12 showed Vit D level = 35... rec to continue 2000u daily.  MEMORY LOSS >>  ~  5/14:  She mentioned memory loss & MMSE= 25/30, started on ARICEPT5...  ABNORMALITY OF GAIT (ICD-781.2)  ANXIETY (ICD-300.00) - she is under the care of DrPlovsky for Psychiatry now on EFFEXOR XR 75mg - 2 tabs Qam, & LORAZEPAM 0.5mg  as needed... she was prev on Wellbutrin & Celexa, but DrPlovsky makes freq changes.  ANEMIA (ICD-285.9) - after her diverticular bleed in 2006... on Fe therapy w/ resolution... ~  labs 8/08 showed Hg= 12.8 ~  labs 7/10 showed Hg= 13.2 ~  labs 1/11 showed Hg= 13.9, Fe= 66 (16%sat) ~  labs 1/12 showed Hg= 13.5 ~  Labs 12/12 showed Hg= 14.0  HEALTH MAINTENANCE:  She gets the annual Flu vaccines each fall;  Given TDAP 7/12...   Past Surgical History  Procedure Laterality Date  . Appendectomy    . Cholecystectomy    . Abdominal hysterectomy    .  Total knee arthroplasty      left  . Total knee arthroplasty      right  . Cataract extraction    . Inguinal hernia repair  05/21/2012    WITH MESH   . Ventral hernia repair  05/21/2012    Procedure: LAPAROSCOPIC VENTRAL HERNIA;  Surgeon: Atilano Ina, MD,FACS;  Location: [facility] OR;  Service: General;  Laterality: N/A;    Outpatient Encounter Prescriptions as of 01/18/2013  Medication Sig Dispense Refill  . atorvastatin (LIPITOR) 10 MG tablet Take 10 mg by mouth daily.      [name] donepezil (ARICEPT) 5 MG tablet Take 1 tablet (5 mg total) by mouth at bedtime as needed.  30 tablet  11  . dorzolamide-timolol (COSOPT) 22.3-6.8 MG/ML ophthalmic solution Place 1 drop into both eyes Three times a day.      . esomeprazole (NEXIUM) 40 MG capsule Take 1  capsule (40 mg total) by mouth daily.  30 capsule  3  . felodipine (PLENDIL) 5 MG 24 hr tablet TAKE 1 TABLET (5 MG TOTAL) BY MOUTH DAILY.  30 tablet  5  . lactose free nutrition (BOOST) LIQD 1 drink 2-3 times daily      . LORazepam (ATIVAN) 0.5 MG tablet Take 0.5 mg by mouth at bedtime.      [name] losartan-hydrochlorothiazide (HYZAAR) 100-25 MG per tablet Take 1/2 tablet by mouth daily      . meloxicam (MOBIC) 7.5 MG tablet Take 7.5 mg by mouth daily as needed. For arthritis pain      . metoprolol succinate (TOPROL-XL) 50 MG 24 hr tablet TAKE 1 TABLET BY MOUTH DAILY  30 tablet  6  . Multiple Vitamins-Minerals (CENTRUM SILVER ULTRA WOMENS PO) Take 1 tablet by mouth daily.      . phenazopyridine (PYRIDIUM) 200 MG tablet Take 1 tablet (200 mg total) by mouth 3 (three) times daily as needed for pain.  15 tablet  0  . sertraline (ZOLOFT) 100 MG tablet Take 1 tablet (100 mg total) by mouth daily.  30 tablet  5   No facility-administered encounter medications on file as of 01/18/2013.    Allergies  Allergen Reactions  . Amoxicillin Itching    Chest and back  . Cephalexin Itching    Chest and back  . Penicillins Itching    Chest and back    Current Medications, Allergies, Past Medical History, Past Surgical History, Family History, and Social History were reviewed in [employer] record.     Review of Systems         See HPI - all other systems neg except as noted...   The patient complains of dyspnea on exertion and difficulty walking.  The patient denies anorexia, fever, weight loss, weight gain, vision loss, decreased hearing, hoarseness, chest pain, syncope, peripheral edema, prolonged cough, headaches, hemoptysis, abdominal pain, melena, hematochezia, severe indigestion/heartburn, hematuria, incontinence, muscle weakness, suspicious skin lesions, transient blindness, depression, unusual weight change, abnormal bleeding, enlarged lymph nodes, and angioedema.      Objective:   Physical Exam      WD, Overweight, 79 y/o BF in NAD... GENERAL:  Alert, pleasant & cooperative... I did not do formal MMSE... HEENT:  [place]/AT, EOM-wnl, PERRLA, EACs-clear, TMs-wnl, NOSE-clear, THROAT-clear & wnl, JAW- no pain or tenderness. NECK:  Supple w/ fairROM; no JVD; normal carotid impulses w/o bruits; no thyromegaly or nodules palpated (cannot feel the multinod goiter), no lymphadenopathy. CHEST:  Clear to P & A; without wheezes/ rales/ or rhonchi  heard... HEART:  Regular Rhythm; without murmurs/ rubs/ or gallops detected... ABDOMEN:  Soft & nontender; normal bowel sounds; no organomegaly or masses palpated... EXT:  s/p bilat TKRs, mod arthritic changes; no varicose veins/ +venous insuffic/ tr edema. NEURO:  CN's intact; no focal neuro deficits... DERM:  No lesions noted; no rash etc...  RADIOLOGY DATA:  Reviewed in the EPIC EMR & discussed w/ the patient...  LABORATORY DATA:  Reviewed in the EPIC EMR & discussed w/ the patient...   Assessment & Plan:    MEMORY LOSS>  On ARICEPT5mg /d now, she does not want Neuro eval, he social situation is a problem...  Weight Loss>  No obvious cause on our eval as above; she is concerned & has had thorough evaluations by GI, GYN, etc; encouraged 3 meals/d + 3 cans Boost per day=> [facility] nutrition...  HBP>  Controlled on Toprol, Plendil, Hyzaar; but BP sl low w/ her wt loss- therefore decr the Hyzaar to 1/2 tab daily...  CAD>  Followed by Walker Kehr, stable on meds, no angina...  Palpit>  She had PACs & PVCs on Holter, symptoms improved w/ BBlocker, takes extra 1/4-1/2 as needed...  CHOL>  She was stable on Lip10 + diet Rx; Pharm confirms Lipitor not refilled in 40mo & FLP not at goal; asked to restart Lip10 & stay on it...  Multinod Thyroid>  Clinically & biochem euthyroid, not on meds...  GI>  eval by [name] reviewed> HH, GERD, hx stricture, Divertics, Hems, Polyps; she has some wt loss & vague intermittent symptoms & he  found LLQ hernia- s/p lap hernia repair by [name] 10/13...  DJD>  Followed by [name], on Mobic prn, exercises by walking w/ "Keebler"...  Anxiety>  Prev followed by DrPlovsky on Lorazepam & Effexor changed to zoloft100...   Patient's Medications  New Prescriptions   No medications on file  Previous Medications   ATORVASTATIN (LIPITOR) 10 MG TABLET    Take 10 mg by mouth daily.   DONEPEZIL (ARICEPT) 5 MG TABLET    Take 1 tablet (5 mg total) by mouth at bedtime as needed.   DORZOLAMIDE-TIMOLOL (COSOPT) 22.3-6.8 MG/ML OPHTHALMIC SOLUTION    Place 1 drop into both eyes Three times a day.   ESOMEPRAZOLE (NEXIUM) 40 MG CAPSULE    Take 1 capsule (40 mg total) by mouth daily.   FELODIPINE (PLENDIL) 5 MG 24 HR TABLET    TAKE 1 TABLET (5 MG TOTAL) BY MOUTH DAILY.   LACTOSE FREE NUTRITION (BOOST) LIQD    1 drink 2-3 times daily   LORAZEPAM (ATIVAN) 0.5 MG TABLET    Take 0.5 mg by mouth at bedtime.   LOSARTAN-HYDROCHLOROTHIAZIDE (HYZAAR) 100-25 MG PER TABLET    Take 1/2 tablet by mouth daily   MELOXICAM (MOBIC) 7.5 MG TABLET    Take 7.5 mg by mouth daily as needed. For arthritis pain   METOPROLOL SUCCINATE (TOPROL-XL) 50 MG 24 HR TABLET    TAKE 1 TABLET BY MOUTH DAILY   MULTIPLE VITAMINS-MINERALS (CENTRUM SILVER ULTRA WOMENS PO)    Take 1 tablet by mouth daily.   PHENAZOPYRIDINE (PYRIDIUM) 200 MG TABLET    Take 1 tablet (200 mg total) by mouth 3 (three) times daily as needed for pain.   SERTRALINE (ZOLOFT) 100 MG TABLET    Take 1 tablet (100 mg total) by mouth daily.  Modified Medications   Modified Medication Previous Medication   LOSARTAN-HYDROCHLOROTHIAZIDE (HYZAAR) 100-25 MG PER TABLET losartan-hydrochlorothiazide (HYZAAR) 100-25 MG per tablet      TAKE  1 TABLET EVERY DAY    TAKE 1 TABLET EVERY DAY  Discontinued Medications   No medications on file

## 2013-01-18 NOTE — Patient Instructions (Addendum)
Today we updated your med list in our EPIC system...    Continue your current medications the same...  We did not find a tick on you today...  Today we did your follow up non-fasting blood work...    We will contact you w/ the results when available...   We will arrange for a consultation at the [facility] to help you GAIN weight...    Continue the 3 meals per day & the boost supplement betw meals & at bedtime...  Let's keep the prev sched f/u appt in August..[name]

## 2013-01-19 ENCOUNTER — Telehealth: Payer: Self-pay | Admitting: Pulmonary Disease

## 2013-01-19 NOTE — Telephone Encounter (Signed)
I spoke with the pt and she states she did find a tick on her last night. She pulled it off and kept it. The pt denied fever, headache, muscle aches, neck stiffness, redness at the bite. Pt has kept the tick . I advised her to keep the tick and to advise [place] if she has any of the symptoms anytime in the next 2 weeks. Pt states understanding. Carron Curie, CMA

## 2013-01-25 ENCOUNTER — Encounter: Payer: Self-pay | Admitting: Dietician

## 2013-01-25 ENCOUNTER — Encounter: Payer: Medicare Other | Attending: Pulmonary Disease | Admitting: Dietician

## 2013-01-25 VITALS — Ht 63.0 in | Wt 130.9 lb

## 2013-01-25 DIAGNOSIS — R634 Abnormal weight loss: Secondary | ICD-10-CM | POA: Insufficient documentation

## 2013-01-25 DIAGNOSIS — Z713 Dietary counseling and surveillance: Secondary | ICD-10-CM | POA: Insufficient documentation

## 2013-01-25 NOTE — Progress Notes (Signed)
Medical Nutrition Therapy:  Appt start time: 1100 end time:  1200.  Assessment:  Primary concerns today: unintentional wt loss.[name]   MEDICATIONS: see list.   DIETARY INTAKE:  24-hr recall:  B ( AM): oatmeal (water, no sugar, maybe butter 1/2 cup dry) with toast (dry), coffee or tea (cream and sugar 1 TBSP each), sometimes eggs or sausage (1-2 times per week) Snk ( AM): sometimes a cookie L ( PM): sandwich (ham, mayo); ice cream 1-2 times per week; sometimes a salad (dressing, tomatoes, rarely cheese, lettuce) Snk ( PM): sometimes a cookie D ( PM): chix (usually broiled, sometimes fried, 1-2 drumsticks), veg (salad dressing), baked potato (butter); sometimes a small steak Snk ( PM): ice cream or piece of cake Beverages: sweet iced tea or lemonade, rarely milk, Boost in between meals and/or during day, 2 Boosts right before bed  Pt claims she does not eat as much since her husband passed away and she lives at home. She tries to eat full 3 meals each day.  Usual physical activity: Pt has a dog and walks the dog every morning for about 1-1.5 miles. Does some yard work, Scientist, clinical (histocompatibility and immunogenetics).  Progress Towards Goal(s):  In progress.   Nutritional Diagnosis:  NI-1.4 Inadequate energy intake As related to small portion size, low fat intake.  As evidenced by pt diet recall, pt reports of unintentional wt loss (~40 lbs) over past few years.    Intervention:  Nutrition counseling provided regarding the High Protein, High Calorie diet. Recommendations made such as increased frequency of snacking, and adding more high fat, high protein foods to diet, such as nuts, seeds, full fat yogurt, and added cheese to many foods. RD explained that further wt loss may be detrimental to pt's strength, and that she should focus on adding high calorie components to her food since the portions are very small. Pt expressed understanding.  Handouts given during visit include:  High Calorie, High Protein nutrition  therapy  Monitoring/Evaluation:  Dietary intake, and body weight in 2 month(s).

## 2013-03-06 ENCOUNTER — Other Ambulatory Visit: Payer: Self-pay | Admitting: Pulmonary Disease

## 2013-03-17 ENCOUNTER — Ambulatory Visit: Payer: Medicare Other | Admitting: Pulmonary Disease

## 2013-03-29 ENCOUNTER — Ambulatory Visit: Payer: Medicare Other | Admitting: Dietician

## 2013-04-02 ENCOUNTER — Encounter: Payer: Self-pay | Admitting: Pulmonary Disease

## 2013-04-02 ENCOUNTER — Ambulatory Visit: Payer: Medicare Other | Admitting: Pulmonary Disease

## 2013-04-02 ENCOUNTER — Other Ambulatory Visit (INDEPENDENT_AMBULATORY_CARE_PROVIDER_SITE_OTHER): Payer: Medicare Other

## 2013-04-02 ENCOUNTER — Ambulatory Visit (INDEPENDENT_AMBULATORY_CARE_PROVIDER_SITE_OTHER): Payer: Medicare Other | Admitting: Pulmonary Disease

## 2013-04-02 VITALS — BP 138/64 | HR 62 | Temp 98.1°F | Ht 63.0 in | Wt 135.0 lb

## 2013-04-02 DIAGNOSIS — M545 Low back pain, unspecified: Secondary | ICD-10-CM

## 2013-04-02 DIAGNOSIS — E7800 Pure hypercholesterolemia, unspecified: Secondary | ICD-10-CM

## 2013-04-02 DIAGNOSIS — M1990 Unspecified osteoarthritis, unspecified site: Secondary | ICD-10-CM

## 2013-04-02 DIAGNOSIS — K5730 Diverticulosis of large intestine without perforation or abscess without bleeding: Secondary | ICD-10-CM

## 2013-04-02 DIAGNOSIS — E041 Nontoxic single thyroid nodule: Secondary | ICD-10-CM

## 2013-04-02 DIAGNOSIS — Z860100 Personal history of colon polyps, unspecified: Secondary | ICD-10-CM

## 2013-04-02 DIAGNOSIS — R634 Abnormal weight loss: Secondary | ICD-10-CM

## 2013-04-02 DIAGNOSIS — F411 Generalized anxiety disorder: Secondary | ICD-10-CM

## 2013-04-02 DIAGNOSIS — R413 Other amnesia: Secondary | ICD-10-CM

## 2013-04-02 DIAGNOSIS — R269 Unspecified abnormalities of gait and mobility: Secondary | ICD-10-CM

## 2013-04-02 DIAGNOSIS — I10 Essential (primary) hypertension: Secondary | ICD-10-CM

## 2013-04-02 DIAGNOSIS — K449 Diaphragmatic hernia without obstruction or gangrene: Secondary | ICD-10-CM

## 2013-04-02 DIAGNOSIS — K219 Gastro-esophageal reflux disease without esophagitis: Secondary | ICD-10-CM

## 2013-04-02 DIAGNOSIS — I2510 Atherosclerotic heart disease of native coronary artery without angina pectoris: Secondary | ICD-10-CM

## 2013-04-02 LAB — HEPATIC FUNCTION PANEL
ALT: 14 U/L (ref 0–35)
Alkaline Phosphatase: 52 U/L (ref 39–117)
Bilirubin, Direct: 0.1 mg/dL (ref 0.0–0.3)
Total Protein: 7.2 g/dL (ref 6.0–8.3)

## 2013-04-02 LAB — BASIC METABOLIC PANEL
CO2: 31 mEq/L (ref 19–32)
Chloride: 105 mEq/L (ref 96–112)
Glucose, Bld: 95 mg/dL (ref 70–99)
Sodium: 139 mEq/L (ref 135–145)

## 2013-04-02 LAB — CBC WITH DIFFERENTIAL/PLATELET
Basophils Absolute: 0 10*3/uL (ref 0.0–0.1)
Eosinophils Absolute: 0 10*3/uL (ref 0.0–0.7)
Lymphocytes Relative: 19.1 % (ref 12.0–46.0)
MCHC: 32.8 g/dL (ref 30.0–36.0)
Neutrophils Relative %: 69.2 % (ref 43.0–77.0)
Platelets: 304 10*3/uL (ref 150.0–400.0)
RDW: 14.9 % — ABNORMAL HIGH (ref 11.5–14.6)

## 2013-04-02 LAB — TSH: TSH: 1.01 u[IU]/mL (ref 0.35–5.50)

## 2013-04-02 LAB — SEDIMENTATION RATE: Sed Rate: 12 mm/hr (ref 0–22)

## 2013-04-02 NOTE — Progress Notes (Signed)
Subjective:    Patient ID: [name], female    DOB: Feb 25, 1928, 79 y.o.   MRN: [id number]  HPI 79 y/o [name] here for a 6 month follow up visit...  she has multiple medical problems as noted below...  Followed for general medical purposes w/ hx of OSA, HBP, CAD, Hyperchol, Multinod thyroid, HH, Divertics & polyps, DJD, Anxiety, etc...  ~  July 11, 2011:  80mo ROV & add-on for wt loss> pt called very concerned about losing "20lbs in 3 months" but on eval today it is only 11# in 80months from 177==>166# w/ her 23" height her current BMI is 27.5;  She notes good appetite, usually eats 3 meals/d, and denies any GI symptoms- w/o jaw prob, swallowing difficulty, abd pain, N/V, D/C/blood, etc; she had colonoscopy 5/12 by DrEdwards w/ divertics, hems, no polyps, no lesions; still she is quite concerned & we outlined an evaluation including labs & CT Chest/ Abd/ Pelvis to address her concerns and r/o underlying/ hidden reason for her wt loss...    BP well regulated on meds & she denies CP, palpit, dizzy, SOB, edema, etc;  Chol looks good on Lip10;  She has thyroid nodule but has been clinically & biochemically euthyroid;  She notes a little arthritis pain in her neck & wants refill Mobic;  NOTE:  LABS ret w/ K=3.0 on her Hyzaar 100-25 & K20Bid> I called [facility] & they note Hyzaar filled 10/31 #30 & 9/15 #30, but K20 last filled 09/01/09- we called in #60 1Bid (pt asked again to bring all meds to every visit);  See prob list below>>  ~  November 11, 2011:  53mo ROV & [name] continues to complain of weight loss (wt=163# down 3# further in the last 53mo) despite drinking Boost Bid she says; reminded to eat 3 meals regularly & use the supplement betw meals as a "medication";  She intermittently & irregularly c/o abd discomfort- sometimes when she eats, sometimes in LLQ area; Denies n/v, dysphagia, reflux symptoms, change in bowels or blood seen; she sees DrJEdwards for GI> as noted above- she had colonoscopy  5/12 by DrEdwards w/ divertics, hems, no polyps, no lesions; and we did CT Chest/ Abd/ Pelvis 12/12 w/ cardiomeg, divertics,but otherw neg & no lesions seen... ?she wants further eval & we discussed referral back to GI for their review & recommendations>> (Appt sched for 4/11 at 10:30AM)...    During last OV her potassium was low at 3.0 & call to Pharm indicated she wasn't filling her KCl supplement (on Hyzaar for BP- see below); we called in K20Bid & she tells me she is taking it properly ever since> K=3.8 today, continue same...    LABS 4/13:  FLP on Lip10 showed TChol 223, TG 90, HDL 62, LDL 140 (I called Pharm- hasn't refilled in 5 months!);  Chems- wnl w/ K=3.8 on K20Bid...  ~  May 12, 2012:  73mo ROV & DrEWilson, [facility] plans spigelian hernia repair soon;  Weight is down to 148#, still w/ pretty good appetite, eating 3 meals + Boost etc... She has no specific complaints & her pre-op labs all look good...    We reviewed prob list, meds, xrays and labs> see below for updates >>  CXR 10/13 showed heart at upper lim of norm, lungs clear, NAD.Marland KitchenMarland Kitchen EKG 10/13 showed Sinus arrhythmia, rate63, incr voltage, NSSTTWA, no change from prev... LABS 10/13:  Chems- wnl w/ creat=0.91;  CBC- wnl w/ Hg=13.8 & Alb=3.5;  TFTs= wnl, euthyroid...   ~  Dec 15, 2012:  728mo ROV & [name] states "doin pretty good- I can't complain" but notes issue w/ her memory (see below) & we discussed starting Aricept5 today; she lives alone & next of kin are 2 grandkids but they are not local & not involved, she has a cousin that helps; she has lost weight down to 133# (down 15# in the interval) & states that appetite is fair- asked to incr the nutritional supplements, denies pain anywhere, etc...  We reviewed the following medical problems during today's office visit >>     HBP> on MetopER50, Plendil5, & Hyzaar100-25; BP= 108/64 w/ her wt loss 7 we decided to decr the Hyzaar to 1/2 tab daily...    CAD> stable on above meds (she is intol to  ASA) w/o CP, palpit, ch in SOB, edema, etc; prev cath 2006 w/ min CAD & focal 30%CIRC; prev hx palpit w/ holter showing PACs & PVCs only; she knows to avoid caffeine...    CHOL> on Lip10; reminded to take her meds daily & bring bottles to each OV for review; she will ret fasting for labs...    Multinod Thyroid> no palp nodules & clinically euthyroid; last labs also wnl & she will ret for f/u blood work...    GI- HH, Divertics, Polyps> on Nexium40; she has seen DrStark & DrJEdwards; CT Abd 7/13 showed LLQ spigelian hernia- eval by [facility] w/ laparoscopic repair 10/13 by DrEWilson & no complications...    Wt Loss> she has had extensive evals & hx reviewed by TP over several visits this yr- ?poss related to Effexor Rx (prev depression rx by [employer]) & she was switched to Zoloft, taking supplements regularly, got a new puppy...    DJD- s/p bilat TKRs> also w/ hx LBP & prev epid steroid injections; followed by DrDean & on Mobic7.5 prn...    Vit D Defic> reminded to continue VitD 2000u daily OTC supplement...    Memory Loss> she mentioned this 5/14 & MMSE w/ 25/30 & we rec starting Aricept5...    Anxiety & Depression> she lives alone, has a new puppy, no close relatives; on Ativan0.5 prn & Zoloft100mg /d... We reviewed prob list, meds, xrays and labs> see below for updates >> she did not bring med bottles to the OV today... SHE WILL NEED f/u FASTING LABS ON RETURN...  ~  January 18, 2013:  28mo ROV & add-on for tic bite> she found tic on her shoulder this AM & panicked- not attached, removed & destroyed; she denies f/c/s, aching soreness, malaise, or rash- she is reassured about the exposure... She is taking the Aricept5mg /d, she is lost 2# to 132# today & we discussed nutritional supplements and referral to [facility]...     We reviewed prob list, meds, xrays and labs> see below for updates >>  LABS 6/14:  She was asked to go to the lab for blood work (non-fasting) today but she forgot (again)   ~  April 02, 2013:  2-13mo ROV & [name] reports a fall at home- no apparent injury, she is becoming increasingly concerned about being on her own, is getting forgetful, but still doing her own meds; she is thinking about selling her house and looking into assisted living;  Last OV she was exposed to a tick- no illness as a result of this, she had lost 2# more & now is up 3# on Boost supplements;  We reviewed the following medical problems during today's office visit >>     HBP>  on MetopER50, Plendil5, & Hyzaar100-25 (she never decr to 1/2); BP= 138/64 & she denies CP, palpit, SOB, dizzy, edema, etc...    CAD> stable on above meds (she is intol to ASA) w/o CP, palpit, ch in SOB, etc; prev cath 2006 w/ min CAD & focal 30%CIRC; prev hx palpit w/ holter showing PACs & PVCs only; she knows to avoid caffeine...    CHOL> on Lip10; reminded to take her meds daily & bring bottles to each OV for review; she never remembers to come fasting for f/u FLP...    Multinod Thyroid> no palp nodules & clinically euthyroid; last TFTs 10/13 were all wnl...    GI- HH, Divertics, Polyps> on Nexium40; she has seen DrStark & DrJEdwards; CT Abd 7/13 showed LLQ spigelian hernia- eval by [facility] w/ laparoscopic repair 10/13 by DrEWilson & no complications...    Wt Loss> she has had extensive evals & hx reviewed by TP over several visits this yr- ?poss related to Effexor Rx (prev depression rx by [employer]) & she was switched to Zoloft, taking supplements regularly, got a new puppy [name])...    DJD- s/p bilat TKRs> also w/ hx LBP & prev epid steroid injections; followed by DrDean & on Mobic7.5 prn...    Vit D Defic> reminded to continue VitD 2000u daily OTC supplement...    Memory Loss> she mentioned this 5/14 & MMSE w/ 25/30 & we rec starting Aricept5 & tol well...    Anxiety & Depression> she lives alone, has a new puppy, no close relatives; on Ativan0.5 prn & Zoloft100mg /d... We reviewed prob list, meds, xrays and labs> see below for updates  >>  LABS 8/14:  Chems, LFTs, CBC, Thyroid, Sed> all wnl...           Problem List:       GLAUCOMA - on eye drops from DrShapiro...  HEARING LOSS - saw DrByers for hearing eval & they discussed hearing aides.  CHRONIC OSTEOMYELITIS OTHER SPECIFIED SITES (ICD-730.18) - she developed osteomyelitis of the right mandible 2009 related to a tooth abcess and path showed bact colonies consistant w/ Actinomyces- oral surg= DrRehm & seen by ID- DrFitzgerald and was treated w/ Clindamycin (due to Pen allergy)...  she stopped the Clindamycin on her own... last seen by DrFitzgerald 4/10 (57mo off Clinda) and doing satis- he released her... still follows up w/ DrRehm for OralSurg...  OBSTRUCTIVE SLEEP APNEA (ICD-327.23) - mod OSA w/ sleep study 8/04 showing RDI=23, consult DrClance... Rx CPAP 10... states she is using CPAP regularly and tol well...  BRONCHITIS, RECURRENT (ICD-491.9) - no recent upper resp infection... ~  CXR 1/11 showed clear lungs, WNL.[name]. ~  CT Chest (along w/ Abd/Pelvis) 12/12 due to c/o wt loss showed cardiomegaly, clear lungs, NAD...  HYPERTENSION (ICD-401.9) - controlled on TOPROL XL 50mg /d, PLENDIL 5mg /d, HYZAAR 100/25 daily, & KCl Bid...  ~  7/12: BP=136/74 and asymptomatic- denies HA, fatigue, visual changes, CP, palipit, dizziness, syncope, dyspnea, edema, etc... she has a long hx of chr noncompliance w/ med Rx... ~  12/12:  BP= 124/82 and she notes wt loss & some weakness; Labs revealed K=3.0 & pharm confirms she hasn't filled K20 since 1/11> asked to bring all meds bottles to every visit & we called in K20 1Bid #60... ~  4/13:  BP= 132/88 & she confirms taking meds properly + the K20Bid; LABS showed K=3.8, Renal= wnl, Creat=0.9; rec to continue same meds... ~  10/13:  BP= 122/70 & she remains asymptomatic... ~  5/14:  on MetopER50, Plendil5, & Hyzaar100-25; BP= 108/64 w/ her wt loss 7 we decided to decr the Hyzaar to 1/2 tab daily (she never did)... ~  8/14: on MetopER50,  Plendil5, & Hyzaar100-25 (she never decr to 1/2); BP= 138/64 & she denies CP, palpit, SOB, dizzy, edema, etc  CAD (ICD-414.00) - non-obstructive disease w/ cath 1/06 showing 30% focal stenosis in CIRC... NuclearStressTest 9/07 showed apical thinning, no ischemia, EF=69%... followed by Walker Kehr for her HBP, palpit, lipids> Event recoder showed benign PVCs & PACs, no change in med Rx... she can't take ASA, she says. ~  3/14:  She had f/u DrNishan> doing satis & no changes made in her meds...  HYPERCHOLESTEROLEMIA (ICD-272.0) - on LIPITOR 10mg /d...  ~  FLP 8/08 w/ TChol 174, TG 76, HDL 65, LDL 94 ~  FLP 7/09 showed TChol 173, TG 76, HDL 57, LDL 101 ~  FLP 7/10 showed TChol 174, TG 94, HDL 57, LDL 99 ~  FLP 7/11 showed TChol 166, TG 90, HDL 58, LDL 91 ~  FLP 1/12 on Lip10 showed TChol 158, TG 91, HDL 53, LDL 86 ~  FLP 4/13 on Lip10 showed TChol 223, TG 90, HDL 62, LDL 140; what happened? Call to pharm revealed that she hasn't refilled Lip10 in 5months! Rec to take daily...  Hx of THYROID NODULE (ICD-241.0) - she has multinodular thyroid, no palp nodules, clinically euthyroid & doing well...  ~  labs 8/08 showed TSH = 0.87 ~  labs 7/09 showed TSH = 0.70 ~  labs 7/10 showed TSH= 1.12 ~  labs 1/11 showed TSH= 1.06 ~  labs 1/12 showed TSH= 1.53 ~  Labs 12/12 showed TSH= 1.08, FreeT3= 2.5 (2.3-4.2). FreeT4= 0.66 (0.60-1.60) ~  Labs 10/13 showed TSH= 0.78, FreeT3= 2.6, FreeT4= 0.78 ~  Labs 8/14 showed TSH= 1/01  HIATAL HERNIA (ICD-553.3) - on NEXIUM 40mg /d... HH on prev CTscans... EGD in 1998 w/ distal esoph stricture dilated... no recurrent dysphagia etc... ~  last EGD 7/09 by DrStark showed 4cmHH, GERD, gastritis & gastic polyps- neg HPylori... Rx w/ NEXIUM 40mg Bid ==> then weaned to one per day.  DIVERTICULOSIS OF COLON (ICD-562.10), & COLONIC POLYPS, ADENOMATOUS, HX OF (ICD-V12.72) - colonoscopy 5/05 by DrStark w/ several sm 3-4 mm polyps, divertics, & f/u planned 42yrs... hosp in 2006 w/ divertic  bleed that resolved spont... repeat hosp and f/u colonoscopy 7/09 showed divertics, and several 4-72mm adenomatous polyps... ~  11/11:  eval by DrStark for diarrhea & fecal incontinence> he rec refer to [facility], she declined & says she improved w/ METAMUCIL daily. ~  5/12:  She sought second opinion GI from DrEdwards> colonoscopy w/ divertics, hems, no colitis; Asked to decr milk products, given Hyoscyamine 0.125mg  SL Qid prn... ~  CT Abd 7/13 showed LLQ Spigelian hernia on CT Abd per DrNeal; pt referred to [facility] & DrWilson plans hernia repair soon... ~  10/13: s/p laparoscopic hernia repair by DrEWilson; pt did well w/o complications...  Pt concern for weight loss >> despite good appetite, eating well she says, taking Boost supplements Bid... ~  Prev GI evals from DrStark, then DrJEdwards as above... ~  7/12:  Weight = 177# ~  12/12:  Weight = 166# ~  CT Chest/Abd/Pelvis 12/12 showed cardiomeg, clear lungs & no lesions in the chest; Divertics & otherw no signif lesions in the Abd... ~  4/13:  Weight = 163#; Pt still c/o wt loss & we will refer back to GI for further assessment & on-going management... ~  CT  Abd&Pelvis 7/13 by DrRNeal showed cardiomegaly, prob abd wall hernia in LLQ c/w Spigelian hernia w/ 5-6cm hernia sac, numerous divertics (no complic), calcif in Ao & iliacs, s/p hyst/ GB, multilevel DDD, left posterior urinary bladder divertic, NO MASSES etc... ~  10/13:  Weight is down to 148# still w/ pretty good appetite, eating 3 meals + Boost etc... ~  She has had several interval evaluations by TP w/ change in antidepressant meds etc- now on Zoloft100... ~  5/14:  Weight is down to 133# & she is encouraged to eat 3 meals/d & drinks supplement Tid as well... ~  8/14:  Weight is up 3# to 135# today on Boost supplements- continue same...  DEGENERATIVE JOINT DISEASE (ICD-715.90) - followed by DrDean w/ severe DJD, s/p bilat TKRs... S/P Left TKR in 12/04 by DrDean & Right TKR 7/02 by DrKrege...  known CSpine and L/S spine disease... s/p recent epid steroid shot in lower back w/ improvement... we refilled her MOBIC 7.5mg  for as needed use. ~  8/11:  she fell & saw DrDean & DrNewton for pain & paresthesias in left arm & improved w/ CSpine injection.  BACK PAIN, LUMBAR (ICD-724.2) >> she has known multilevel DDD w/ disc/osteophyte complexes, retrolisthesis, etc (see CT Abd 7/13)...  VITAMIN D DEFICIENCY (ICD-268.9) - Vit D level Jul09 = 19 & started on Vit D 50K weekly... ~  labs 7/10 showed Vit D level = 33... OK to switch to OTC Vit D 1000 u daily, also takes MVI & Calcium. ~  labs 1/12 showed Vit D level = 26... rec incr OTC supplement to 2000 u daily. ~  Labs 12/12 showed Vit D level = 35... rec to continue 2000u daily.  MEMORY LOSS >>  ~  5/14:  She mentioned memory loss & MMSE= 25/30, started on ARICEPT5...  ABNORMALITY OF GAIT (ICD-781.2)  ANXIETY (ICD-300.00) - she is under the care of DrPlovsky for Psychiatry now on EFFEXOR XR 75mg - 2 tabs Qam, & LORAZEPAM 0.5mg  as needed... she was prev on Wellbutrin & Celexa, but DrPlovsky makes freq changes.  ANEMIA (ICD-285.9) - after her diverticular bleed in 2006... on Fe therapy w/ resolution... ~  labs 8/08 showed Hg= 12.8 ~  labs 7/10 showed Hg= 13.2 ~  labs 1/11 showed Hg= 13.9, Fe= 66 (16%sat) ~  labs 1/12 showed Hg= 13.5 ~  Labs 12/12 showed Hg= 14.0 ~  Labs 10/13 showed Hg= 13.8 ~  Labs 8/14 showed Hg= 13.6  HEALTH MAINTENANCE:  She gets the annual Flu vaccines each fall;  Given TDAP 7/12...   Past Surgical History  Procedure Laterality Date  . Appendectomy    . Cholecystectomy    . Abdominal hysterectomy    . Total knee arthroplasty      left  . Total knee arthroplasty      right  . Cataract extraction    . Inguinal hernia repair  05/21/2012    WITH MESH   . Ventral hernia repair  05/21/2012    Procedure: LAPAROSCOPIC VENTRAL HERNIA;  Surgeon: Atilano Ina, MD,FACS;  Location: [facility] OR;  Service: General;   Laterality: N/A;    Outpatient Encounter Prescriptions as of 04/02/2013  Medication Sig Dispense Refill  . atorvastatin (LIPITOR) 10 MG tablet Take 10 mg by mouth daily.      [name] donepezil (ARICEPT) 5 MG tablet Take 1 tablet (5 mg total) by mouth at bedtime as needed.  30 tablet  11  . dorzolamide-timolol (COSOPT) 22.3-6.8 MG/ML ophthalmic solution Place 1  drop into both eyes Three times a day.      . esomeprazole (NEXIUM) 40 MG capsule Take 1 capsule (40 mg total) by mouth daily.  30 capsule  3  . felodipine (PLENDIL) 5 MG 24 hr tablet TAKE 1 TABLET (5 MG TOTAL) BY MOUTH DAILY.  30 tablet  5  . lactose free nutrition (BOOST) LIQD 1 drink 2-3 times daily      . LORazepam (ATIVAN) 0.5 MG tablet Take 0.5 mg by mouth at bedtime.      [name] losartan-hydrochlorothiazide (HYZAAR) 100-25 MG per tablet TAKE 1 TABLET EVERY DAY  30 tablet  5  . meloxicam (MOBIC) 7.5 MG tablet Take 7.5 mg by mouth daily as needed. For arthritis pain      . metoprolol succinate (TOPROL-XL) 50 MG 24 hr tablet TAKE 1 TABLET BY MOUTH DAILY  30 tablet  6  . Multiple Vitamins-Minerals (CENTRUM SILVER ULTRA WOMENS PO) Take 1 tablet by mouth daily.      . phenazopyridine (PYRIDIUM) 200 MG tablet Take 1 tablet (200 mg total) by mouth 3 (three) times daily as needed for pain.  15 tablet  0  . sertraline (ZOLOFT) 100 MG tablet Take 1 tablet (100 mg total) by mouth daily.  30 tablet  5  . [DISCONTINUED] losartan-hydrochlorothiazide (HYZAAR) 100-25 MG per tablet Take 1/2 tablet by mouth daily       No facility-administered encounter medications on file as of 04/02/2013.    Allergies  Allergen Reactions  . Amoxicillin Itching    Chest and back  . Cephalexin Itching    Chest and back  . Penicillins Itching    Chest and back    Current Medications, Allergies, Past Medical History, Past Surgical History, Family History, and Social History were reviewed in [employer] record.     Review of Systems         See  HPI - all other systems neg except as noted...   The patient complains of dyspnea on exertion and difficulty walking.  The patient denies anorexia, fever, weight loss, weight gain, vision loss, decreased hearing, hoarseness, chest pain, syncope, peripheral edema, prolonged cough, headaches, hemoptysis, abdominal pain, melena, hematochezia, severe indigestion/heartburn, hematuria, incontinence, muscle weakness, suspicious skin lesions, transient blindness, depression, unusual weight change, abnormal bleeding, enlarged lymph nodes, and angioedema.     Objective:   Physical Exam      WD, Overweight, 79 y/o [name] in NAD... GENERAL:  Alert, pleasant & cooperative... I did not do formal MMSE... HEENT:  [place]/AT, EOM-wnl, PERRLA, EACs-clear, TMs-wnl, NOSE-clear, THROAT-clear & wnl, JAW- no pain or tenderness. NECK:  Supple w/ fairROM; no JVD; normal carotid impulses w/o bruits; no thyromegaly or nodules palpated (cannot feel the multinod goiter), no lymphadenopathy. CHEST:  Clear to P & A; without wheezes/ rales/ or rhonchi heard... HEART:  Regular Rhythm; without murmurs/ rubs/ or gallops detected... ABDOMEN:  Soft & nontender; normal bowel sounds; no organomegaly or masses palpated... EXT:  s/p bilat TKRs, mod arthritic changes; no varicose veins/ +venous insuffic/ tr edema. NEURO:  CN's intact; no focal neuro deficits... DERM:  No lesions noted; no rash etc...  RADIOLOGY DATA:  Reviewed in the EPIC EMR & discussed w/ the patient...  LABORATORY DATA:  Reviewed in the EPIC EMR & discussed w/ the patient...   Assessment & Plan:    MEMORY LOSS>  On ARICEPT5mg /d now, she does not want Neuro eval, her social situation is a problem...  Weight Loss>  No obvious cause  on our eval as above; she is concerned & has had thorough evaluations by GI, GYN, etc; encouraged 3 meals/d + 3 cans Boost per day=> [facility] nutrition...  HBP>  Controlled on Toprol, Plendil, Hyzaar; & BP is ok on current regimen...  CAD>   Followed by Walker Kehr, stable on meds, no angina...  Palpit>  She had PACs & PVCs on Holter, symptoms improved w/ BBlocker, takes extra 1/4-1/2 as needed...  CHOL>  She was stable on Lip10 + diet Rx; Pharm confirmed Lipitor not refilled in 25mo & FLP wasn't at goal; asked to restart Lip10 & stay on it...  Multinod Thyroid>  Clinically & biochem euthyroid, not on meds...  GI>  eval by DrEdwards reviewed> HH, GERD, hx stricture, Divertics, Hems, Polyps; she has some wt loss & vague intermittent symptoms & he found LLQ hernia- s/p lap hernia repair by DrEWilson 10/13...  DJD>  Followed by DrDean, on Mobic prn, exercises by walking w/ "Keebler"...  Anxiety>  Prev followed by DrPlovsky on Lorazepam & Effexor changed to Zoloft100...   Patient's Medications  New Prescriptions   No medications on file  Previous Medications   ATORVASTATIN (LIPITOR) 10 MG TABLET    Take 10 mg by mouth daily.   DONEPEZIL (ARICEPT) 5 MG TABLET    Take 1 tablet (5 mg total) by mouth at bedtime as needed.   DORZOLAMIDE-TIMOLOL (COSOPT) 22.3-6.8 MG/ML OPHTHALMIC SOLUTION    Place 1 drop into both eyes Three times a day.   ESOMEPRAZOLE (NEXIUM) 40 MG CAPSULE    Take 1 capsule (40 mg total) by mouth daily.   FELODIPINE (PLENDIL) 5 MG 24 HR TABLET    TAKE 1 TABLET (5 MG TOTAL) BY MOUTH DAILY.   LACTOSE FREE NUTRITION (BOOST) LIQD    1 drink 2-3 times daily   LORAZEPAM (ATIVAN) 0.5 MG TABLET    Take 0.5 mg by mouth at bedtime.   LOSARTAN-HYDROCHLOROTHIAZIDE (HYZAAR) 100-25 MG PER TABLET    TAKE 1 TABLET EVERY DAY   MELOXICAM (MOBIC) 7.5 MG TABLET    Take 7.5 mg by mouth daily as needed. For arthritis pain   METOPROLOL SUCCINATE (TOPROL-XL) 50 MG 24 HR TABLET    TAKE 1 TABLET BY MOUTH DAILY   MULTIPLE VITAMINS-MINERALS (CENTRUM SILVER ULTRA WOMENS PO)    Take 1 tablet by mouth daily.   PHENAZOPYRIDINE (PYRIDIUM) 200 MG TABLET    Take 1 tablet (200 mg total) by mouth 3 (three) times daily as needed for pain.   SERTRALINE  (ZOLOFT) 100 MG TABLET    Take 1 tablet (100 mg total) by mouth daily.  Modified Medications   No medications on file  Discontinued Medications   LOSARTAN-HYDROCHLOROTHIAZIDE (HYZAAR) 100-25 MG PER TABLET    Take 1/2 tablet by mouth daily

## 2013-04-02 NOTE — Patient Instructions (Addendum)
Today we updated your med list in our EPIC system...    Continue your current medications the same...  Today we did your follow up blood work...    We will contact you w/ the results when available...   Keep up the good job of eating & taking the BOOST supplement...    Your weight was up 3# today...  Call for any questions...  Let's plan a follow up visit in 28mo, sooner if needed for problems.Marland KitchenMarland Kitchen

## 2013-04-06 DIAGNOSIS — Z0289 Encounter for other administrative examinations: Secondary | ICD-10-CM

## 2013-04-23 ENCOUNTER — Other Ambulatory Visit: Payer: Self-pay | Admitting: Pulmonary Disease

## 2013-05-07 ENCOUNTER — Ambulatory Visit (INDEPENDENT_AMBULATORY_CARE_PROVIDER_SITE_OTHER): Payer: Medicare Other | Admitting: Cardiovascular Disease

## 2013-05-07 ENCOUNTER — Encounter: Payer: Self-pay | Admitting: Cardiovascular Disease

## 2013-05-07 VITALS — BP 153/82 | HR 63 | Ht 65.5 in | Wt 132.0 lb

## 2013-05-07 DIAGNOSIS — I10 Essential (primary) hypertension: Secondary | ICD-10-CM

## 2013-05-07 DIAGNOSIS — F411 Generalized anxiety disorder: Secondary | ICD-10-CM

## 2013-05-07 DIAGNOSIS — I491 Atrial premature depolarization: Secondary | ICD-10-CM

## 2013-05-07 DIAGNOSIS — E7800 Pure hypercholesterolemia, unspecified: Secondary | ICD-10-CM

## 2013-05-07 DIAGNOSIS — I2510 Atherosclerotic heart disease of native coronary artery without angina pectoris: Secondary | ICD-10-CM

## 2013-05-07 NOTE — Patient Instructions (Signed)
Your physician wants you to follow-up in:  6 MONTHS WITH DR NISHAN  You will receive a reminder letter in the mail two months in advance. If you don't receive a letter, please call our office to schedule the follow-up appointment. Your physician recommends that you continue on your current medications as directed. Please refer to the Current Medication list given to you today. 

## 2013-05-07 NOTE — Progress Notes (Signed)
Patient ID: [name], female   DOB: 1927-11-14, 79 y.o.   MRN: [id number] [name] is seen today for F/U of HTN, elevated lipids and palpitations. Event monitor reviewed and showed only benign PAC's and PVC's. There was no afib. She had a normal cath in 2006 and non-ischemic myhovue in 2007. She is tolerating lipitor with no myalgias. She's had some arthritic hip and neck pain that she just saw Tammy Parret for. She is not having any SSCP, dyspnea, she ambulates well with a cane and she is having less palpitations on Toprol  Health nurse noted irregular pusle Patient has had these chronic PAC;s and PVC;s for years  Has rescue dog named Donley Redder that is good company but widowed and no family    ROS: Denies fever, malais, weight loss, blurry vision, decreased visual acuity, cough, sputum, SOB, hemoptysis, pleuritic pain, palpitaitons, heartburn, abdominal pain, melena, lower extremity edema, claudication, or rash.  All other systems reviewed and negative  General: Affect appropriate Healthy:  appears stated age HEENT: normal Neck supple with no adenopathy JVP normal no bruits no thyromegaly Lungs clear with no wheezing and good diaphragmatic motion Heart:  S1/S2 no murmur, no rub, gallop or click PMI normal Abdomen: benighn, BS positve, no tenderness, no AAA no bruit.  No HSM or HJR Distal pulses intact with no bruits No edema Neuro non-focal Skin warm and dry No muscular weakness   Current Outpatient Prescriptions  Medication Sig Dispense Refill  . atorvastatin (LIPITOR) 10 MG tablet Take 10 mg by mouth daily.      [name] donepezil (ARICEPT) 5 MG tablet Take 1 tablet (5 mg total) by mouth at bedtime as needed.  30 tablet  11  . dorzolamide-timolol (COSOPT) 22.3-6.8 MG/ML ophthalmic solution Place 1 drop into both eyes Three times a day.      . esomeprazole (NEXIUM) 40 MG capsule Take 1 capsule (40 mg total) by mouth daily.  30 capsule  3  . felodipine (PLENDIL) 5 MG 24 hr tablet TAKE 1  TABLET (5 MG TOTAL) BY MOUTH DAILY.  30 tablet  5  . lactose free nutrition (BOOST) LIQD 1 drink 2-3 times daily      . LORazepam (ATIVAN) 0.5 MG tablet Take 0.5 mg by mouth at bedtime.      [name] losartan-hydrochlorothiazide (HYZAAR) 100-25 MG per tablet TAKE 1 TABLET EVERY DAY  30 tablet  5  . meloxicam (MOBIC) 7.5 MG tablet Take 7.5 mg by mouth daily as needed. For arthritis pain      . metoprolol succinate (TOPROL-XL) 50 MG 24 hr tablet TAKE 1 TABLET BY MOUTH DAILY  30 tablet  6  . Multiple Vitamins-Minerals (CENTRUM SILVER ULTRA WOMENS PO) Take 1 tablet by mouth daily.      . phenazopyridine (PYRIDIUM) 200 MG tablet Take 1 tablet (200 mg total) by mouth 3 (three) times daily as needed for pain.  15 tablet  0  . sertraline (ZOLOFT) 100 MG tablet Take 1 tablet (100 mg total) by mouth daily.  30 tablet  5   No current facility-administered medications for this visit.    Allergies  Amoxicillin; Cephalexin; and Penicillins  Electrocardiogram:  05/24/12  SR rate 63  LAD LVH   Today SR rate 63  LVH PVC  No sig change from 2013   Assessment and Plan

## 2013-05-07 NOTE — Assessment & Plan Note (Signed)
Stable with no angina and good activity level.  Continue medical Rx  

## 2013-05-07 NOTE — Assessment & Plan Note (Signed)
Well controlled.  Continue current medications and low sodium Dash type diet.    

## 2013-05-07 NOTE — Assessment & Plan Note (Signed)
Benign palpitations more quiescent  Continue current meds

## 2013-05-07 NOTE — Assessment & Plan Note (Signed)
Widowed with no family Some lonliness and stress dealing with all her affairs  Continue current anti depressant

## 2013-05-07 NOTE — Assessment & Plan Note (Signed)
Cholesterol is at goal.  Continue current dose of statin and diet Rx.  No myalgias or side effects.  F/U  LFT's in 6 months. Lab Results  Component Value Date   LDLCALC 86 08/16/2010

## 2013-05-10 ENCOUNTER — Encounter: Payer: Self-pay | Admitting: Cardiovascular Disease

## 2013-05-21 ENCOUNTER — Other Ambulatory Visit: Payer: Self-pay | Admitting: Adult Health

## 2013-06-17 ENCOUNTER — Telehealth: Payer: Self-pay | Admitting: Pulmonary Disease

## 2013-06-17 NOTE — Telephone Encounter (Signed)
I spoke with pt. She wanted to make an appt to discuss weight issues. I scheduled her to come in on Tuesday to see TP. Nothing further needed

## 2013-06-22 ENCOUNTER — Encounter: Payer: Self-pay | Admitting: Adult Health

## 2013-06-22 ENCOUNTER — Ambulatory Visit (INDEPENDENT_AMBULATORY_CARE_PROVIDER_SITE_OTHER): Payer: Medicare Other | Admitting: Adult Health

## 2013-06-22 ENCOUNTER — Encounter (INDEPENDENT_AMBULATORY_CARE_PROVIDER_SITE_OTHER): Payer: Self-pay

## 2013-06-22 VITALS — BP 138/76 | HR 65 | Temp 97.6°F | Ht 63.0 in | Wt 135.8 lb

## 2013-06-22 DIAGNOSIS — R634 Abnormal weight loss: Secondary | ICD-10-CM

## 2013-06-22 NOTE — Progress Notes (Signed)
Subjective:    Patient ID: [name], female    DOB: 1928-07-18, 79 y.o.   MRN: [id number]  HPI    Review of Systems     Objective:   Physical Exam        Assessment & Plan:    Subjective:    Patient ID: [name], female    DOB: 01/04/1928, 79 y.o.   MRN: [id number]  HPI 79 y/o BF here for a 6 month follow up visit...  she has multiple medical problems as noted below...  Followed for general medical purposes w/ hx of OSA, HBP, CAD, Hyperchol, Multinod thyroid, HH, Divertics & polyps, DJD, Anxiety, etc...  ~  July 11, 2011:  75mo ROV & add-on for wt loss> pt called very concerned about losing "20lbs in 3 months" but on eval today it is only 11# in 75months from 177==>166# w/ her 69" height her current BMI is 27.5;  She notes good appetite, usually eats 3 meals/d, and denies any GI symptoms- w/o jaw prob, swallowing difficulty, abd pain, N/V, D/C/blood, etc; she had colonoscopy 5/12 by DrEdwards w/ divertics, hems, no polyps, no lesions; still she is quite concerned & we outlined an evaluation including labs & CT Chest/ Abd/ Pelvis to address her concerns and r/o underlying/ hidden reason for her wt loss...    BP well regulated on meds & she denies CP, palpit, dizzy, SOB, edema, etc;  Chol looks good on Lip10;  She has thyroid nodule but has been clinically & biochemically euthyroid;  She notes a little arthritis pain in her neck & wants refill Mobic;  NOTE:  LABS ret w/ K=3.0 on her Hyzaar 100-25 & K20Bid> I called [facility] & they note Hyzaar filled 10/31 #30 & 9/15 #30, but K20 last filled 09/01/09- we called in #60 1Bid (pt asked again to bring all meds to every visit);  See prob list below>>  ~  November 11, 2011:  30mo ROV & [place] continues to complain of weight loss (wt=163# down 3# further in the last 30mo) despite drinking Boost Bid she says; reminded to eat 3 meals regularly & use the supplement betw meals as a "medication";  She intermittently & irregularly c/o  abd discomfort- sometimes when she eats, sometimes in LLQ area; Denies n/v, dysphagia, reflux symptoms, change in bowels or blood seen; she sees DrJEdwards for GI> as noted above- she had colonoscopy 5/12 by DrEdwards w/ divertics, hems, no polyps, no lesions; and we did CT Chest/ Abd/ Pelvis 12/12 w/ cardiomeg, divertics,but otherw neg & no lesions seen... ?she wants further eval & we discussed referral back to GI for their review & recommendations>> (Appt sched for 4/11 at 10:30AM)...    During last OV her potassium was low at 3.0 & call to Pharm indicated she wasn't filling her KCl supplement (on Hyzaar for BP- see below); we called in K20Bid & she tells me she is taking it properly ever since> K=3.8 today, continue same...    LABS 4/13:  FLP on Lip10 showed TChol 223, TG 90, HDL 62, LDL 140 (I called Pharm- hasn't refilled in 5 months!);  Chems- wnl w/ K=3.8 on K20Bid...  ~  May 12, 2012:  40mo ROV & DrEWilson, [facility] plans spigelian hernia repair soon;  Weight is down to 148#, still w/ pretty good appetite, eating 3 meals + Boost etc... She has no specific complaints & her pre-op labs all look good...    We reviewed prob list, meds, xrays and  labs> see below for updates >>  CXR 10/13 showed heart at upper lim of norm, lungs clear, NAD.Marland KitchenMarland Kitchen EKG 10/13 showed Sinus arrhythmia, rate63, incr voltage, NSSTTWA, no change from prev... LABS 10/13:  Chems- wnl w/ creat=0.91;  CBC- wnl w/ Hg=13.8 & Alb=3.5;  TFTs= wnl, euthyroid...   ~  Dec 15, 2012:  70mo ROV & Suriya states "doin pretty good- I can't complain" but notes issue w/ her memory (see below) & we discussed starting Aricept5 today; she lives alone & next of kin are 2 grandkids but they are not local & not involved, she has a cousin that helps; she has lost weight down to 133# (down 15# in the interval) & states that appetite is fair- asked to incr the nutritional supplements, denies pain anywhere, etc...  We reviewed the following medical problems during  today's office visit >>     HBP> on MetopER50, Plendil5, & Hyzaar100-25; BP= 108/64 w/ her wt loss 7 we decided to decr the Hyzaar to 1/2 tab daily...    CAD> stable on above meds (she is intol to ASA) w/o CP, palpit, ch in SOB, edema, etc; prev cath 2006 w/ min CAD & focal 30%CIRC; prev hx palpit w/ holter showing PACs & PVCs only; she knows to avoid caffeine...    CHOL> on Lip10; reminded to take her meds daily & bring bottles to each OV for review; she will ret fasting for labs...    Multinod Thyroid> no palp nodules & clinically euthyroid; last labs also wnl & she will ret for f/u blood work...    GI- HH, Divertics, Polyps> on Nexium40; she has seen DrStark & DrJEdwards; CT Abd 7/13 showed LLQ spigelian hernia- eval by [facility] w/ laparoscopic repair 10/13 by DrEWilson & no complications...    Wt Loss> she has had extensive evals & hx reviewed by TP over several visits this yr- ?poss related to Effexor Rx (prev depression rx by [employer]) & she was switched to Zoloft, taking supplements regularly, got a new puppy...    DJD- s/p bilat TKRs> also w/ hx LBP & prev epid steroid injections; followed by DrDean & on Mobic7.5 prn...    Vit D Defic> reminded to continue VitD 2000u daily OTC supplement...    Memory Loss> she mentioned this 5/14 & MMSE w/ 25/30 & we rec starting Aricept5...    Anxiety & Depression> she lives alone, has a new puppy, no close relatives; on Ativan0.5 prn & Zoloft100mg /d... We reviewed prob list, meds, xrays and labs> see below for updates >> she did not bring med bottles to the OV today... SHE WILL NEED f/u FASTING LABS ON RETURN...  ~  January 18, 2013:  51mo ROV & add-on for tic bite> she found tic on her shoulder this AM & panicked- not attached, removed & destroyed; she denies f/c/s, aching soreness, malaise, or rash- she is reassured about the exposure... She is taking the Aricept5mg /d, she is lost 2# to 132# today & we discussed nutritional supplements and referral to [facility]  Nutrition...     We reviewed prob list, meds, xrays and labs> see below for updates >>  LABS 6/14:  She was asked to go to the lab for blood work (non-fasting) today but she forgot (again)   ~  April 02, 2013:  2-36mo ROV & Shell reports a fall at home- no apparent injury, she is becoming increasingly concerned about being on her own, is getting forgetful, but still doing her own meds; she is thinking about  selling her house and looking into assisted living;  Last OV she was exposed to a tick- no illness as a result of this, she had lost 2# more & now is up 3# on Boost supplements;  We reviewed the following medical problems during today's office visit >>     HBP> on MetopER50, Plendil5, & Hyzaar100-25 (she never decr to 1/2); BP= 138/64 & she denies CP, palpit, SOB, dizzy, edema, etc...    CAD> stable on above meds (she is intol to ASA) w/o CP, palpit, ch in SOB, etc; prev cath 2006 w/ min CAD & focal 30%CIRC; prev hx palpit w/ holter showing PACs & PVCs only; she knows to avoid caffeine...    CHOL> on Lip10; reminded to take her meds daily & bring bottles to each OV for review; she never remembers to come fasting for f/u FLP...    Multinod Thyroid> no palp nodules & clinically euthyroid; last TFTs 10/13 were all wnl...    GI- HH, Divertics, Polyps> on Nexium40; she has seen DrStark & DrJEdwards; CT Abd 7/13 showed LLQ spigelian hernia- eval by [facility] w/ laparoscopic repair 10/13 by DrEWilson & no complications...    Wt Loss> she has had extensive evals & hx reviewed by TP over several visits this yr- ?poss related to Effexor Rx (prev depression rx by [employer]) & she was switched to Zoloft, taking supplements regularly, got a new puppy Levora Angel)...    DJD- s/p bilat TKRs> also w/ hx LBP & prev epid steroid injections; followed by DrDean & on Mobic7.5 prn...    Vit D Defic> reminded to continue VitD 2000u daily OTC supplement...    Memory Loss> she mentioned this 5/14 & MMSE w/ 25/30 & we rec starting  Aricept5 & tol well...    Anxiety & Depression> she lives alone, has a new puppy, no close relatives; on Ativan0.5 prn & Zoloft100mg /d... We reviewed prob list, meds, xrays and labs> see below for updates >>  LABS 8/14:  Chems, LFTs, CBC, Thyroid, Sed> all wnl...  06/22/2013 Follow up  Returns today because she noticed her weight was going down.  pt would like to discuss weight - reports her scale at home says 113.  reports appetite/eating habits are unchanged Our office scale shows her weight at 135 which is up 3 lbs from last month.  Pt has had an extensive workup for weight loss over the past couple of  years .    Prev GI evals from DrStark, then DrJEdwards  ~  CT Chest/Abd/Pelvis 12/12 showed cardiomeg, clear lungs & no lesions in the chest; Divertics & otherw no signif lesions in the Abd...  ~  CT Abd&Pelvis 7/13 by DrRNeal showed cardiomegaly, prob abd wall hernia in LLQ c/w Spigelian hernia w/ 5-6cm hernia sac, numerous divertics (no complic), calcif in Ao & iliacs, s/p hyst/ GB, multilevel DDD, left posterior urinary bladder divertic, NO MASSES etc...  Labs have been unrevealing w/ nml cbc, and thyroid panel.   Says she eats  3 meals daily and  drinks boost in b/tw meals. She says she feels good. She is very focused on her weight.  She was changed from effexor to zoloft in case effexor was contributing to weight loss. Earlier this year.   She denies chest pain , orthopnea, bloody stools, fever or abdominal pain. No recent travel or abx. Use.            Problem List:       GLAUCOMA - on eye drops from  DrShapiro...  HEARING LOSS - saw DrByers for hearing eval & they discussed hearing aides.  CHRONIC OSTEOMYELITIS OTHER SPECIFIED SITES (ICD-730.18) - she developed osteomyelitis of the right mandible 2009 related to a tooth abcess and path showed bact colonies consistant w/ Actinomyces- oral surg= DrRehm & seen by ID- DrFitzgerald and was treated w/ Clindamycin (due to Pen allergy)...   she stopped the Clindamycin on her own... last seen by DrFitzgerald 4/10 (61mo off Clinda) and doing satis- he released her... still follows up w/ DrRehm for OralSurg...  OBSTRUCTIVE SLEEP APNEA (ICD-327.23) - mod OSA w/ sleep study 8/04 showing RDI=23, consult DrClance... Rx CPAP 10... states she is using CPAP regularly and tol well...  BRONCHITIS, RECURRENT (ICD-491.9) - no recent upper resp infection... ~  CXR 1/11 showed clear lungs, WNL.[name]. ~  CT Chest (along w/ Abd/Pelvis) 12/12 due to c/o wt loss showed cardiomegaly, clear lungs, NAD...  HYPERTENSION (ICD-401.9) - controlled on TOPROL XL 50mg /d, PLENDIL 5mg /d, HYZAAR 100/25 daily, & KCl Bid...  ~  7/12: BP=136/74 and asymptomatic- denies HA, fatigue, visual changes, CP, palipit, dizziness, syncope, dyspnea, edema, etc... she has a long hx of chr noncompliance w/ med Rx... ~  12/12:  BP= 124/82 and she notes wt loss & some weakness; Labs revealed K=3.0 & pharm confirms she hasn't filled K20 since 1/11> asked to bring all meds bottles to every visit & we called in K20 1Bid #60... ~  4/13:  BP= 132/88 & she confirms taking meds properly + the K20Bid; LABS showed K=3.8, Renal= wnl, Creat=0.9; rec to continue same meds... ~  10/13:  BP= 122/70 & she remains asymptomatic... ~  5/14: on MetopER50, Plendil5, & Hyzaar100-25; BP= 108/64 w/ her wt loss 7 we decided to decr the Hyzaar to 1/2 tab daily (she never did)... ~  8/14: on MetopER50, Plendil5, & Hyzaar100-25 (she never decr to 1/2); BP= 138/64 & she denies CP, palpit, SOB, dizzy, edema, etc  CAD (ICD-414.00) - non-obstructive disease w/ cath 1/06 showing 30% focal stenosis in CIRC... NuclearStressTest 9/07 showed apical thinning, no ischemia, EF=69%... followed by Walker Kehr for her HBP, palpit, lipids> Event recoder showed benign PVCs & PACs, no change in med Rx... she can't take ASA, she says. ~  3/14:  She had f/u DrNishan> doing satis & no changes made in her  meds...  HYPERCHOLESTEROLEMIA (ICD-272.0) - on LIPITOR 10mg /d...  ~  FLP 8/08 w/ TChol 174, TG 76, HDL 65, LDL 94 ~  FLP 7/09 showed TChol 173, TG 76, HDL 57, LDL 101 ~  FLP 7/10 showed TChol 174, TG 94, HDL 57, LDL 99 ~  FLP 7/11 showed TChol 166, TG 90, HDL 58, LDL 91 ~  FLP 1/12 on Lip10 showed TChol 158, TG 91, HDL 53, LDL 86 ~  FLP 4/13 on Lip10 showed TChol 223, TG 90, HDL 62, LDL 140; what happened? Call to pharm revealed that she hasn't refilled Lip10 in 5months! Rec to take daily...  Hx of THYROID NODULE (ICD-241.0) - she has multinodular thyroid, no palp nodules, clinically euthyroid & doing well...  ~  labs 8/08 showed TSH = 0.87 ~  labs 7/09 showed TSH = 0.70 ~  labs 7/10 showed TSH= 1.12 ~  labs 1/11 showed TSH= 1.06 ~  labs 1/12 showed TSH= 1.53 ~  Labs 12/12 showed TSH= 1.08, FreeT3= 2.5 (2.3-4.2). FreeT4= 0.66 (0.60-1.60) ~  Labs 10/13 showed TSH= 0.78, FreeT3= 2.6, FreeT4= 0.78 ~  Labs 8/14 showed TSH= 1/01  HIATAL HERNIA (ICD-553.3) -  on NEXIUM 40mg /d... HH on prev CTscans... EGD in 1998 w/ distal esoph stricture dilated... no recurrent dysphagia etc... ~  last EGD 7/09 by DrStark showed 4cmHH, GERD, gastritis & gastic polyps- neg HPylori... Rx w/ NEXIUM 40mg Bid ==> then weaned to one per day.  DIVERTICULOSIS OF COLON (ICD-562.10), & COLONIC POLYPS, ADENOMATOUS, HX OF (ICD-V12.72) - colonoscopy 5/05 by DrStark w/ several sm 3-4 mm polyps, divertics, & f/u planned 77yrs... hosp in 2006 w/ divertic bleed that resolved spont... repeat hosp and f/u colonoscopy 7/09 showed divertics, and several 4-49mm adenomatous polyps... ~  11/11:  eval by DrStark for diarrhea & fecal incontinence> he rec refer to [facility], she declined & says she improved w/ METAMUCIL daily. ~  5/12:  She sought second opinion GI from DrEdwards> colonoscopy w/ divertics, hems, no colitis; Asked to decr milk products, given Hyoscyamine 0.125mg  SL Qid prn... ~  CT Abd 7/13 showed LLQ Spigelian hernia on CT Abd  per DrNeal; pt referred to [facility] & DrWilson plans hernia repair soon... ~  10/13: s/p laparoscopic hernia repair by DrEWilson; pt did well w/o complications...  Pt concern for weight loss >> despite good appetite, eating well she says, taking Boost supplements Bid... ~  Prev GI evals from DrStark, then DrJEdwards as above... ~  7/12:  Weight = 177# ~  12/12:  Weight = 166# ~  CT Chest/Abd/Pelvis 12/12 showed cardiomeg, clear lungs & no lesions in the chest; Divertics & otherw no signif lesions in the Abd... ~  4/13:  Weight = 163#; Pt still c/o wt loss & we will refer back to GI for further assessment & on-going management... ~  CT Abd&Pelvis 7/13 by DrRNeal showed cardiomegaly, prob abd wall hernia in LLQ c/w Spigelian hernia w/ 5-6cm hernia sac, numerous divertics (no complic), calcif in Ao & iliacs, s/p hyst/ GB, multilevel DDD, left posterior urinary bladder divertic, NO MASSES etc... ~  10/13:  Weight is down to 148# still w/ pretty good appetite, eating 3 meals + Boost etc... ~  She has had several interval evaluations by TP w/ change in antidepressant meds etc- now on Zoloft100... ~  5/14:  Weight is down to 133# & she is encouraged to eat 3 meals/d & drinks supplement Tid as well... ~  8/14:  Weight is up 3# to 135# today on Boost supplements- continue same...  DEGENERATIVE JOINT DISEASE (ICD-715.90) - followed by DrDean w/ severe DJD, s/p bilat TKRs... S/P Left TKR in 12/04 by DrDean & Right TKR 7/02 by DrKrege... known CSpine and L/S spine disease... s/p recent epid steroid shot in lower back w/ improvement... we refilled her MOBIC 7.5mg  for as needed use. ~  8/11:  she fell & saw DrDean & DrNewton for pain & paresthesias in left arm & improved w/ CSpine injection.  BACK PAIN, LUMBAR (ICD-724.2) >> she has known multilevel DDD w/ disc/osteophyte complexes, retrolisthesis, etc (see CT Abd 7/13)...  VITAMIN D DEFICIENCY (ICD-268.9) - Vit D level Jul09 = 19 & started on Vit D 50K  weekly... ~  labs 7/10 showed Vit D level = 33... OK to switch to OTC Vit D 1000 u daily, also takes MVI & Calcium. ~  labs 1/12 showed Vit D level = 26... rec incr OTC supplement to 2000 u daily. ~  Labs 12/12 showed Vit D level = 35... rec to continue 2000u daily.  MEMORY LOSS >>  ~  5/14:  She mentioned memory loss & MMSE= 25/30, started on ARICEPT5...  ABNORMALITY OF GAIT (  UXL-244.2)  ANXIETY (ICD-300.00) - she is under the care of DrPlovsky for Psychiatry now on EFFEXOR XR 75mg - 2 tabs Qam, & LORAZEPAM 0.5mg  as needed... she was prev on Wellbutrin & Celexa, but DrPlovsky makes freq changes.  ANEMIA (ICD-285.9) - after her diverticular bleed in 2006... on Fe therapy w/ resolution... ~  labs 8/08 showed Hg= 12.8 ~  labs 7/10 showed Hg= 13.2 ~  labs 1/11 showed Hg= 13.9, Fe= 66 (16%sat) ~  labs 1/12 showed Hg= 13.5 ~  Labs 12/12 showed Hg= 14.0 ~  Labs 10/13 showed Hg= 13.8 ~  Labs 8/14 showed Hg= 13.6  HEALTH MAINTENANCE:  She gets the annual Flu vaccines each fall;  Given TDAP 7/12...   Past Surgical History  Procedure Laterality Date  . Appendectomy    . Cholecystectomy    . Abdominal hysterectomy    . Total knee arthroplasty      left  . Total knee arthroplasty      right  . Cataract extraction    . Inguinal hernia repair  05/21/2012    WITH MESH   . Ventral hernia repair  05/21/2012    Procedure: LAPAROSCOPIC VENTRAL HERNIA;  Surgeon: Atilano Ina, MD,FACS;  Location: [facility] OR;  Service: General;  Laterality: N/A;    Outpatient Encounter Prescriptions as of 06/22/2013  Medication Sig  . atorvastatin (LIPITOR) 10 MG tablet Take 10 mg by mouth daily.  [name] donepezil (ARICEPT) 5 MG tablet Take 1 tablet (5 mg total) by mouth at bedtime as needed.  . dorzolamide-timolol (COSOPT) 22.3-6.8 MG/ML ophthalmic solution Place 1 drop into both eyes Three times a day.  . esomeprazole (NEXIUM) 40 MG capsule Take 1 capsule (40 mg total) by mouth daily.  . felodipine (PLENDIL) 5 MG 24  hr tablet TAKE 1 TABLET (5 MG TOTAL) BY MOUTH DAILY.  [name] lactose free nutrition (BOOST) LIQD 1 drink 2-3 times daily  . LORazepam (ATIVAN) 0.5 MG tablet Take 0.5 mg by mouth at bedtime.  [name] losartan-hydrochlorothiazide (HYZAAR) 100-25 MG per tablet TAKE 1 TABLET EVERY DAY  . meloxicam (MOBIC) 7.5 MG tablet Take 7.5 mg by mouth daily as needed. For arthritis pain  . metoprolol succinate (TOPROL-XL) 50 MG 24 hr tablet TAKE 1 TABLET BY MOUTH DAILY  . Multiple Vitamins-Minerals (CENTRUM SILVER ULTRA WOMENS PO) Take 1 tablet by mouth daily.  . sertraline (ZOLOFT) 100 MG tablet TAKE 1 TABLET (100 MG TOTAL) BY MOUTH DAILY.    Allergies  Allergen Reactions  . Amoxicillin Itching    Chest and back  . Cephalexin Itching    Chest and back  . Penicillins Itching    Chest and back    Current Medications, Allergies, Past Medical History, Past Surgical History, Family History, and Social History were reviewed in [employer] record.     Review of Systems         See HPI - all other systems neg except as noted...   The patient complains of dyspnea on exertion and difficulty walking.  The patient denies anorexia, fever, weight loss, weight gain, vision loss, decreased hearing, hoarseness, chest pain, syncope, peripheral edema, prolonged cough, headaches, hemoptysis, abdominal pain, melena, hematochezia, severe indigestion/heartburn, hematuria, incontinence, muscle weakness, suspicious skin lesions, transient blindness, depression, unusual weight change, abnormal bleeding, enlarged lymph nodes, and angioedema.     Objective:   Physical Exam      WD, 79 y/o BF in NAD... GENERAL:  Alert, pleasant & cooperative...  HEENT:  [place]/AT, EOM-wnl, PERRLA,  EACs-clear, TMs-wnl, NOSE-clear, THROAT-clear & wnl, JAW- no pain or tenderness. NECK:  Supple w/ fairROM; no JVD; normal carotid impulses w/o bruits; no thyromegaly or nodules palpated   no lymphadenopathy. CHEST:  Clear to P & A; without  wheezes/ rales/ or rhonchi heard... HEART:  Regular Rhythm; without murmurs/ rubs/ or gallops detected... ABDOMEN:  Soft & nontender; normal bowel sounds; no organomegaly or masses palpated... EXT:  s/p bilat TKRs, mod arthritic changes; no varicose veins/ +venous insuffic/ tr edema. NEURO:  CN's intact; no focal neuro deficits... DERM:  No lesions noted; no rash etc...   Assessment & Plan:

## 2013-06-22 NOTE — Assessment & Plan Note (Signed)
Wt loss has been stable over last 6 months  She has been able to stay around 135 since March of this year after Effexor discontinued. She is eating good and taking in boost.  Extensive workup unrealing for wt loss It is obvious her scales are not correct at home.  Advised to calibrate or purchase new set  Cont w/ dietician recommendations from previous referral.  No labs today

## 2013-06-22 NOTE — Patient Instructions (Addendum)
Buy new scales for home .  Continue boost Three times a day  Between meals-try lactose intolerant supplement.  Follow nutritionist recommendations for diet follow up Dr. Kriste Basque  As planned and As needed

## 2013-07-16 ENCOUNTER — Telehealth: Payer: Self-pay | Admitting: Pulmonary Disease

## 2013-07-16 DIAGNOSIS — Z8719 Personal history of other diseases of the digestive system: Secondary | ICD-10-CM

## 2013-07-16 NOTE — Telephone Encounter (Signed)
As there is no name to call neighbor back, I called pt to verify below information from neighbor.  I received VM - lmtcb

## 2013-07-16 NOTE — Telephone Encounter (Signed)
Pt aware of recs and referral has been placed. Pt needed further needed

## 2013-07-16 NOTE — Telephone Encounter (Signed)
Neighbor calling back stating she is very concerned about patient. She is leaking stool, abd burning, and she has lost a considerable amount of weigh.

## 2013-07-16 NOTE — Telephone Encounter (Signed)
Pt's neighbor Sudie Bailey Minors called again per [employer] with Sudie Bailey who reported that since the last time she saw pt (approx 30 days ago) pt has visibly lost weight.  Discussed with Sudie Bailey how at the last ov, it was recommended for pt get get a new scale as hers was 57+ yrs old and pt had reported that it had weighed her at 113 that morning.  Per patient (whom I could hear while on the phone while speaking w/ Bernadine) stated that the reported 113 was incorrect -- her scale had weighed her at 130 prior to her last appt.  Per Sudie Bailey pt has been "leaking stool for sometime now" and having a hot/burning sensation in her abdomen x1 month.  No mention of these symptoms in last office notes.  Asked for more information on the recent symptoms and apparently pt is not incontinent of stool, but has a small loose BM when she goes to the bathroom 1-2x per day that she was unaware of the need to go.  Denies any bloody stool, sweet smelling odor, frequent BM's.  Pt stopped drinking Boost as she was concerned this could be the culprit.  Pt denies any decreased appetite/fluid intake.  Dr Kriste Basque please advise, thank you.

## 2013-07-16 NOTE — Telephone Encounter (Signed)
Per SN-refer to GI Follow up ASAP Dr Randa Evens. Thanks.

## 2013-07-16 NOTE — Telephone Encounter (Signed)
Pt c/o burning in stomach.  Worse in am's.  Feels like hot acid in stomach in ams.  Taking Nexium daily.  Pt denies N or V or diarrhea or constipation.  Pt states concern over not gaining weight.  States that her scales show 133.  Pt never purchased new scales as advised by TP.  Tried Boost but caused diarrhea so pt stopped this.  Please advise.

## 2013-08-02 ENCOUNTER — Ambulatory Visit (INDEPENDENT_AMBULATORY_CARE_PROVIDER_SITE_OTHER): Payer: Medicare Other | Admitting: Pulmonary Disease

## 2013-08-02 ENCOUNTER — Encounter: Payer: Self-pay | Admitting: Pulmonary Disease

## 2013-08-02 VITALS — BP 116/72 | HR 55 | Temp 98.2°F | Ht 63.0 in | Wt 132.8 lb

## 2013-08-02 DIAGNOSIS — I10 Essential (primary) hypertension: Secondary | ICD-10-CM

## 2013-08-02 DIAGNOSIS — E041 Nontoxic single thyroid nodule: Secondary | ICD-10-CM

## 2013-08-02 DIAGNOSIS — M545 Low back pain, unspecified: Secondary | ICD-10-CM

## 2013-08-02 DIAGNOSIS — K5730 Diverticulosis of large intestine without perforation or abscess without bleeding: Secondary | ICD-10-CM

## 2013-08-02 DIAGNOSIS — I2510 Atherosclerotic heart disease of native coronary artery without angina pectoris: Secondary | ICD-10-CM

## 2013-08-02 DIAGNOSIS — K219 Gastro-esophageal reflux disease without esophagitis: Secondary | ICD-10-CM

## 2013-08-02 DIAGNOSIS — M1990 Unspecified osteoarthritis, unspecified site: Secondary | ICD-10-CM

## 2013-08-02 DIAGNOSIS — R42 Dizziness and giddiness: Secondary | ICD-10-CM

## 2013-08-02 DIAGNOSIS — E7800 Pure hypercholesterolemia, unspecified: Secondary | ICD-10-CM

## 2013-08-02 DIAGNOSIS — R413 Other amnesia: Secondary | ICD-10-CM

## 2013-08-02 DIAGNOSIS — R269 Unspecified abnormalities of gait and mobility: Secondary | ICD-10-CM

## 2013-08-02 MED ORDER — MECLIZINE HCL 25 MG PO TABS: ORAL_TABLET | ORAL | Status: DC

## 2013-08-02 NOTE — Patient Instructions (Signed)
Today we updated your med list in our EPIC system...    Continue your current medications the same...  For the dizziness>>    Try the new Rx- MECLIZINE 25mg  tabs 1/2 to 1 tab every 6h as needed...  Stay as active as poss...    Continue the Boost etc...  Call for any questions...  Let's plan a follow up visit in 78mo w/ FASTING blood work at that time.Marland KitchenMarland Kitchen

## 2013-08-02 NOTE — Progress Notes (Signed)
Subjective:    Patient ID: [name], female    DOB: [birth year], 79 y.o.   MRN: [id number]  HPI 79 y/o BF here for a follow up visit...  she has multiple medical problems as noted below...  Followed for general medical purposes w/ hx of OSA, HBP, CAD, Hyperchol, Multinod thyroid, HH, Divertics & polyps, DJD, Anxiety, etc...  ~  November 11, 2011:  190mo ROV & [name] continues to complain of weight loss (wt=163# down 3# further in the last 190mo) despite drinking Boost Bid she says; reminded to eat 3 meals regularly & use the supplement betw meals as a "medication";  She intermittently & irregularly c/o abd discomfort- sometimes when she eats, sometimes in LLQ area; Denies n/v, dysphagia, reflux symptoms, change in bowels or blood seen; she sees [name] for GI> as noted above- she had colonoscopy 5/12 by DrEdwards w/ divertics, hems, no polyps, no lesions; and we did CT Chest/ Abd/ Pelvis 12/12 w/ cardiomeg, divertics,but otherw neg & no lesions seen... ?she wants further eval & we discussed referral back to GI for their review & recommendations>> (Appt sched for 4/11 at 10:30AM)...    During last OV her potassium was low at 3.0 & call to Pharm indicated she wasn't filling her KCl supplement (on Hyzaar for BP- see below); we called in K20Bid & she tells me she is taking it properly ever since> K=3.8 today, continue same...    LABS 4/13:  FLP on Lip10 showed TChol 223, TG 90, HDL 62, LDL 140 (I called Pharm- hasn't refilled in 5 months!);  Chems- wnl w/ K=3.8 on K20Bid...  ~  May 12, 2012:  81mo ROV & [name], [facility] plans spigelian hernia repair soon;  Weight is down to 148#, still w/ pretty good appetite, eating 3 meals + Boost etc... She has no specific complaints & her pre-op labs all look good...    We reviewed prob list, meds, xrays and labs> see below for updates >>  CXR 10/13 showed heart at upper lim of norm, lungs clear, NAD.Marland KitchenMarland Kitchen EKG 10/13 showed Sinus arrhythmia, rate63, incr voltage, NSSTTWA,  no change from prev... LABS 10/13:  Chems- wnl w/ creat=0.91;  CBC- wnl w/ Hg=13.8 & Alb=3.5;  TFTs= wnl, euthyroid...   ~  Dec 15, 2012:  2mo ROV & [name] states "doin pretty good- I can't complain" but notes issue w/ her memory (see below) & we discussed starting Aricept5 today; she lives alone & next of kin are 2 grandkids but they are not local & not involved, she has a cousin that helps; she has lost weight down to 133# (down 15# in the interval) & states that appetite is fair- asked to incr the nutritional supplements, denies pain anywhere, etc...  We reviewed the following medical problems during today's office visit >>     HBP> on MetopER50, Plendil5, & Hyzaar100-25; BP= 108/64 w/ her wt loss 7 we decided to decr the Hyzaar to 1/2 tab daily...    CAD> stable on above meds (she is intol to ASA) w/o CP, palpit, ch in SOB, edema, etc; prev cath 2006 w/ min CAD & focal 30%CIRC; prev hx palpit w/ holter showing PACs & PVCs only; she knows to avoid caffeine...    CHOL> on Lip10; reminded to take her meds daily & bring bottles to each OV for review; she will ret fasting for labs...    Multinod Thyroid> no palp nodules & clinically euthyroid; last labs also wnl & she will ret for f/u  blood work...    GI- HH, Divertics, Polyps> on Nexium40; she has seen [name] & [name]; CT Abd 7/13 showed LLQ spigelian hernia- eval by [facility] w/ laparoscopic repair 10/13 by [name] & no complications...    Wt Loss> she has had extensive evals & hx reviewed by TP over several visits this yr- ?poss related to Effexor Rx (prev depression rx by [employer]) & she was switched to Zoloft, taking supplements regularly, got a new puppy...    DJD- s/p bilat TKRs> also w/ hx LBP & prev epid steroid injections; followed by [name] & on Mobic7.5 prn...    Vit D Defic> reminded to continue VitD 2000u daily OTC supplement...    Memory Loss> she mentioned this 5/14 & MMSE w/ 25/30 & we rec starting Aricept5...    Anxiety & Depression>  she lives alone, has a new puppy, no close relatives; on Ativan0.5 prn & Zoloft100mg /d... We reviewed prob list, meds, xrays and labs> see below for updates >> she did not bring med bottles to the OV today... SHE WILL NEED f/u FASTING LABS ON RETURN...  ~  January 18, 2013:  5mo ROV & add-on for tic bite> she found tic on her shoulder this AM & panicked- not attached, removed & destroyed; she denies f/c/s, aching soreness, malaise, or rash- she is reassured about the exposure... She is taking the Aricept5mg /d, she is lost 2# to 132# today & we discussed nutritional supplements and referral to [facility]...     We reviewed prob list, meds, xrays and labs> see below for updates >>  LABS 6/14:  She was asked to go to the lab for blood work (non-fasting) today but she forgot (again)   ~  April 02, 2013:  2-46mo ROV & [name] reports a fall at home- no apparent injury, she is becoming increasingly concerned about being on her own, is getting forgetful, but still doing her own meds; she is thinking about selling her house and looking into assisted living;  Last OV she was exposed to a tick- no illness as a result of this, she had lost 2# more & now is up 3# on Boost supplements;  We reviewed the following medical problems during today's office visit >>     HBP> on MetopER50, Plendil5, & Hyzaar100-25 (she never decr to 1/2); BP= 138/64 & she denies CP, palpit, SOB, dizzy, edema, etc...    CAD> stable on above meds (she is intol to ASA) w/o CP, palpit, ch in SOB, etc; prev cath 2006 w/ min CAD & focal 30%CIRC; prev hx palpit w/ holter showing PACs & PVCs only; she knows to avoid caffeine...    CHOL> on Lip10; reminded to take her meds daily & bring bottles to each OV for review; she never remembers to come fasting for f/u FLP...    Multinod Thyroid> no palp nodules & clinically euthyroid; last TFTs 10/13 were all wnl...    GI- HH, Divertics, Polyps> on Nexium40; she has seen [name] & [name]; CT Abd 7/13  showed LLQ spigelian hernia- eval by [facility] w/ laparoscopic repair 10/13 by [name] & no complications...    Wt Loss> she has had extensive evals & hx reviewed by TP over several visits this yr- ?poss related to Effexor Rx (prev depression rx by [employer]) & she was switched to Zoloft, taking supplements regularly, got a new puppy [name])...    DJD- s/p bilat TKRs> also w/ hx LBP & prev epid steroid injections; followed by [name] & on Mobic7.5 prn.Marland KitchenMarland Kitchen  Vit D Defic> reminded to continue VitD 2000u daily OTC supplement...    Memory Loss> she mentioned this 5/14 & MMSE w/ 25/30 & we rec starting Aricept5 & tol well...    Anxiety & Depression> she lives alone, has a new puppy, no close relatives; on Ativan0.5 prn & Zoloft100mg /d... We reviewed prob list, meds, xrays and labs> see below for updates >>  LABS 8/14:  Chems, LFTs, CBC, Thyroid, Sed> all wnl...  ~  August 02, 2013:  9mo ROV & [name] is stable- CC is some AM dizziness (not taking any OTC meds and we discussed trial Meclizine for prn use), appetite is good, weight is stable in mid 130's eating 3 meals per day and boost 2 cans/d...     HBP controlled w/ MetopER50, Plendil5, Hyzaar100-25 w/ BP= 116/72 & she denies CP, paplit, SOB, edema...     Chol is regulated w/ Lip10 but last FLP was 4/13, not at goals and reminded to ret FASTING for f/u lipid profile...    GI- she remains on Nexium40 and denies abd pain, n/v, d/c, blod seen...     Known DJD w/ bilat TKRs, LBP, s/p epid steroid injections & improved...    She remains on Aricept5, Ativan0.5Qhs and Zoloft100... We reviewed prob list, meds, xrays and labs> see below for updates >> she had the 2014 flu vaccine 10/14...            Problem List:       GLAUCOMA - on eye drops from DrShapiro...  HEARING LOSS - saw DrByers for hearing eval & they discussed hearing aides.  CHRONIC OSTEOMYELITIS OTHER SPECIFIED SITES (ICD-730.18) - she developed osteomyelitis of the right mandible 2009 related  to a tooth abcess and path showed bact colonies consistant w/ Actinomyces- oral surg= DrRehm & seen by ID- DrFitzgerald and was treated w/ Clindamycin (due to Pen allergy)...  she stopped the Clindamycin on her own... last seen by DrFitzgerald 4/10 (46mo off Clinda) and doing satis- he released her... still follows up w/ DrRehm for OralSurg...  OBSTRUCTIVE SLEEP APNEA (ICD-327.23) - mod OSA w/ sleep study 8/04 showing RDI=23, consult DrClance... Rx CPAP 10... states she is using CPAP regularly and tol well...  BRONCHITIS, RECURRENT (ICD-491.9) - no recent upper resp infection... ~  CXR 1/11 showed clear lungs, WNL.[name]. ~  CT Chest (along w/ Abd/Pelvis) 12/12 due to c/o wt loss showed cardiomegaly, clear lungs, NAD...  HYPERTENSION (ICD-401.9) - controlled on TOPROL XL 50mg /d, PLENDIL 5mg /d, HYZAAR 100/25 daily, & KCl Bid...  ~  7/12: BP=136/74 and asymptomatic- denies HA, fatigue, visual changes, CP, palipit, dizziness, syncope, dyspnea, edema, etc... she has a long hx of chr noncompliance w/ med Rx... ~  12/12:  BP= 124/82 and she notes wt loss & some weakness; Labs revealed K=3.0 & pharm confirms she hasn't filled K20 since 1/11> asked to bring all meds bottles to every visit & we called in K20 1Bid #60... ~  4/13:  BP= 132/88 & she confirms taking meds properly + the K20Bid; LABS showed K=3.8, Renal= wnl, Creat=0.9; rec to continue same meds... ~  10/13:  BP= 122/70 & she remains asymptomatic... ~  5/14: on MetopER50, Plendil5, & Hyzaar100-25; BP= 108/64 w/ her wt loss 7 we decided to decr the Hyzaar to 1/2 tab daily (she never did)... ~  8/14: on MetopER50, Plendil5, & Hyzaar100-25 (she never decr to 1/2); BP= 138/64 & she denies CP, palpit, SOB, dizzy, edema, etc ~  12/14: HBP controlled w/ MetopER50, Plendil5,  Hyzaar100-25 w/ BP= 116/72 & she denies CP, paplit, SOB, edema.  CAD (ICD-414.00) - non-obstructive disease w/ cath 1/06 showing 30% focal stenosis in CIRC... NuclearStressTest 9/07  showed apical thinning, no ischemia, EF=69%... followed by Walker Kehr for her HBP, palpit, lipids> Event recoder showed benign PVCs & PACs, no change in med Rx... she can't take ASA, she says. ~  3/14:  She had f/u DrNishan> doing satis & no changes made in her meds...  HYPERCHOLESTEROLEMIA (ICD-272.0) - on LIPITOR 10mg /d...  ~  FLP 8/08 w/ TChol 174, TG 76, HDL 65, LDL 94 ~  FLP 7/09 showed TChol 173, TG 76, HDL 57, LDL 101 ~  FLP 7/10 showed TChol 174, TG 94, HDL 57, LDL 99 ~  FLP 7/11 showed TChol 166, TG 90, HDL 58, LDL 91 ~  FLP 1/12 on Lip10 showed TChol 158, TG 91, HDL 53, LDL 86 ~  FLP 4/13 on Lip10 showed TChol 223, TG 90, HDL 62, LDL 140; what happened? Call to pharm revealed that she hasn't refilled Lip10 in 5months! Rec to take daily...  Hx of THYROID NODULE (ICD-241.0) - she has multinodular thyroid, no palp nodules, clinically euthyroid & doing well...  ~  labs 8/08 showed TSH = 0.87 ~  labs 7/09 showed TSH = 0.70 ~  labs 7/10 showed TSH= 1.12 ~  labs 1/11 showed TSH= 1.06 ~  labs 1/12 showed TSH= 1.53 ~  Labs 12/12 showed TSH= 1.08, FreeT3= 2.5 (2.3-4.2). FreeT4= 0.66 (0.60-1.60) ~  Labs 10/13 showed TSH= 0.78, FreeT3= 2.6, FreeT4= 0.78 ~  Labs 8/14 showed TSH= 1.01  HIATAL HERNIA (ICD-553.3) - on NEXIUM 40mg /d... HH on prev CTscans... EGD in 1998 w/ distal esoph stricture dilated... no recurrent dysphagia etc... ~  last EGD 7/09 by [name] showed 4cmHH, GERD, gastritis & gastic polyps- neg HPylori... Rx w/ NEXIUM 40mg Bid ==> then weaned to one per day.  DIVERTICULOSIS OF COLON (ICD-562.10), & COLONIC POLYPS, ADENOMATOUS, HX OF (ICD-V12.72) - colonoscopy 5/05 by [name] w/ several sm 3-4 mm polyps, divertics, & f/u planned 60yrs... hosp in 2006 w/ divertic bleed that resolved spont... repeat hosp and f/u colonoscopy 7/09 showed divertics, and several 4-22mm adenomatous polyps... ~  11/11:  eval by [name] for diarrhea & fecal incontinence> he rec refer to [facility], she declined &  says she improved w/ METAMUCIL daily. ~  5/12:  She sought second opinion GI from DrEdwards> colonoscopy w/ divertics, hems, no colitis; Asked to decr milk products, given Hyoscyamine 0.125mg  SL Qid prn... ~  CT Abd 7/13 showed LLQ Spigelian hernia on CT Abd per DrNeal; pt referred to [facility] & DrWilson plans hernia repair soon... ~  10/13: s/p laparoscopic hernia repair by [name]; pt did well w/o complications...  Pt concern for weight loss >> despite good appetite, eating well she says, taking Boost supplements Bid... ~  Prev GI evals from [name], then [name] as above... ~  7/12:  Weight = 177# ~  12/12:  Weight = 166# ~  CT Chest/Abd/Pelvis 12/12 showed cardiomeg, clear lungs & no lesions in the chest; Divertics & otherw no signif lesions in the Abd... ~  4/13:  Weight = 163#; Pt still c/o wt loss & we will refer back to GI for further assessment & on-going management... ~  CT Abd&Pelvis 7/13 by DrRNeal showed cardiomegaly, prob abd wall hernia in LLQ c/w Spigelian hernia w/ 5-6cm hernia sac, numerous divertics (no complic), calcif in Ao & iliacs, s/p hyst/ GB, multilevel DDD, left posterior urinary bladder divertic,  NO MASSES etc... ~  10/13:  Weight is down to 148# still w/ pretty good appetite, eating 3 meals + Boost etc... ~  She has had several interval evaluations by TP w/ change in antidepressant meds etc- now on Zoloft100... ~  5/14:  Weight is down to 133# & she is encouraged to eat 3 meals/d & drinks supplement Tid as well... ~  8/14:  Weight is up 3# to 135# today on Boost supplements- continue same... ~  12/14:  Weight = 133# appetite is good, weight is stable in mid 130's eating 3 meals per day and boost 2 cans/d.  DEGENERATIVE JOINT DISEASE (ICD-715.90) - followed by [name] w/ severe DJD, s/p bilat TKRs... S/P Left TKR in 12/04 by [name] & Right TKR 7/02 by DrKrege... known CSpine and L/S spine disease... s/p recent epid steroid shot in lower back w/ improvement... we refilled  her MOBIC 7.5mg  for as needed use. ~  8/11:  she fell & saw [name] & DrNewton for pain & paresthesias in left arm & improved w/ CSpine injection.  BACK PAIN, LUMBAR (ICD-724.2) >> she has known multilevel DDD w/ disc/osteophyte complexes, retrolisthesis, etc (see CT Abd 7/13)...  VITAMIN D DEFICIENCY (ICD-268.9) - Vit D level Jul09 = 19 & started on Vit D 50K weekly... ~  labs 7/10 showed Vit D level = 33... OK to switch to OTC Vit D 1000 u daily, also takes MVI & Calcium. ~  labs 1/12 showed Vit D level = 26... rec incr OTC supplement to 2000 u daily. ~  Labs 12/12 showed Vit D level = 35... rec to continue 2000u daily.  MEMORY LOSS >>  ~  5/14:  She mentioned memory loss & MMSE= 25/30, started on ARICEPT5...  ABNORMALITY OF GAIT (ICD-781.2)  ANXIETY (ICD-300.00) - she is under the care of DrPlovsky for Psychiatry now on EFFEXOR XR 75mg - 2 tabs Qam, & LORAZEPAM 0.5mg  as needed... she was prev on Wellbutrin & Celexa, but DrPlovsky makes freq changes.  ANEMIA (ICD-285.9) - after her diverticular bleed in 2006... on Fe therapy w/ resolution... ~  labs 8/08 showed Hg= 12.8 ~  labs 7/10 showed Hg= 13.2 ~  labs 1/11 showed Hg= 13.9, Fe= 66 (16%sat) ~  labs 1/12 showed Hg= 13.5 ~  Labs 12/12 showed Hg= 14.0 ~  Labs 10/13 showed Hg= 13.8 ~  Labs 8/14 showed Hg= 13.6  HEALTH MAINTENANCE:  She gets the annual Flu vaccines each fall;  Given TDAP 7/12...   Past Surgical History  Procedure Laterality Date  . Appendectomy    . Cholecystectomy    . Abdominal hysterectomy    . Total knee arthroplasty      left  . Total knee arthroplasty      right  . Cataract extraction    . Inguinal hernia repair  05/21/2012    WITH MESH   . Ventral hernia repair  05/21/2012    Procedure: LAPAROSCOPIC VENTRAL HERNIA;  Surgeon: Atilano Ina, MD,FACS;  Location: [facility] OR;  Service: General;  Laterality: N/A;    Outpatient Encounter Prescriptions as of 08/02/2013  Medication Sig  . atorvastatin (LIPITOR)  10 MG tablet Take 10 mg by mouth daily.  [name] donepezil (ARICEPT) 5 MG tablet Take 1 tablet (5 mg total) by mouth at bedtime as needed.  . dorzolamide-timolol (COSOPT) 22.3-6.8 MG/ML ophthalmic solution Place 1 drop into both eyes Three times a day.  . esomeprazole (NEXIUM) 40 MG capsule Take 1 capsule (40 mg total) by mouth daily.  [name]  felodipine (PLENDIL) 5 MG 24 hr tablet TAKE 1 TABLET (5 MG TOTAL) BY MOUTH DAILY.  [name] lactose free nutrition (BOOST) LIQD 1 drink 2-3 times daily  . LORazepam (ATIVAN) 0.5 MG tablet Take 0.5 mg by mouth at bedtime.  [name] losartan-hydrochlorothiazide (HYZAAR) 100-25 MG per tablet TAKE 1 TABLET EVERY DAY  . meloxicam (MOBIC) 7.5 MG tablet Take 7.5 mg by mouth daily as needed. For arthritis pain  . metoprolol succinate (TOPROL-XL) 50 MG 24 hr tablet TAKE 1 TABLET BY MOUTH DAILY  . Multiple Vitamins-Minerals (CENTRUM SILVER ULTRA WOMENS PO) Take 1 tablet by mouth daily.  . sertraline (ZOLOFT) 100 MG tablet TAKE 1 TABLET (100 MG TOTAL) BY MOUTH DAILY.    Allergies  Allergen Reactions  . Amoxicillin Itching    Chest and back  . Cephalexin Itching    Chest and back  . Penicillins Itching    Chest and back    Current Medications, Allergies, Past Medical History, Past Surgical History, Family History, and Social History were reviewed in [employer] record.     Review of Systems         See HPI - all other systems neg except as noted...   The patient complains of dyspnea on exertion and difficulty walking.  The patient denies anorexia, fever, weight loss, weight gain, vision loss, decreased hearing, hoarseness, chest pain, syncope, peripheral edema, prolonged cough, headaches, hemoptysis, abdominal pain, melena, hematochezia, severe indigestion/heartburn, hematuria, incontinence, muscle weakness, suspicious skin lesions, transient blindness, depression, unusual weight change, abnormal bleeding, enlarged lymph nodes, and angioedema.     Objective:    Physical Exam      WD, Overweight, 79 y/o BF in NAD... GENERAL:  Alert, pleasant & cooperative... I did not do formal MMSE... HEENT:  [place]/AT, EOM-wnl, PERRLA, EACs-clear, TMs-wnl, NOSE-clear, THROAT-clear & wnl, JAW- no pain or tenderness. NECK:  Supple w/ fairROM; no JVD; normal carotid impulses w/o bruits; no thyromegaly or nodules palpated (cannot feel the multinod goiter), no lymphadenopathy. CHEST:  Clear to P & A; without wheezes/ rales/ or rhonchi heard... HEART:  Regular Rhythm; without murmurs/ rubs/ or gallops detected... ABDOMEN:  Soft & nontender; normal bowel sounds; no organomegaly or masses palpated... EXT:  s/p bilat TKRs, mod arthritic changes; no varicose veins/ +venous insuffic/ tr edema. NEURO:  CN's intact; no focal neuro deficits... DERM:  No lesions noted; no rash etc...  RADIOLOGY DATA:  Reviewed in the EPIC EMR & discussed w/ the patient...  LABORATORY DATA:  Reviewed in the EPIC EMR & discussed w/ the patient...   Assessment & Plan:    MEMORY LOSS>  On ARICEPT5mg /d now, she does not want Neuro eval, her social situation is a problem...  Weight Loss>  No obvious cause on our eval as above; she is concerned & has had thorough evaluations by GI, GYN, etc; encouraged 3 meals/d + 3 cans Boost per day=> cone nutrition...  HBP>  Controlled on Toprol, Plendil, Hyzaar; & BP is ok on current regimen...  CAD>  Followed by Walker Kehr, stable on meds, no angina...  Palpit>  She had PACs & PVCs on Holter, symptoms improved w/ BBlocker, takes extra 1/4-1/2 as needed...  CHOL>  She was stable on Lip10 + diet Rx; Pharm confirmed Lipitor not refilled in 53mo & FLP wasn't at goal; asked to restart Lip10 & stay on it...  Multinod Thyroid>  Clinically & biochem euthyroid, not on meds...  GI>  eval by DrEdwards reviewed> HH, GERD, hx stricture, Divertics, Hems, Polyps;  she has some wt loss & vague intermittent symptoms & he found LLQ hernia- s/p lap hernia repair by [name]  10/13...  DJD>  Followed by [name], on Mobic prn, exercises by walking w/ "Keebler"...  Anxiety>  Prev followed by DrPlovsky on Lorazepam & Effexor changed to Zoloft100...   Patient's Medications  New Prescriptions   MECLIZINE (ANTIVERT) 25 MG TABLET    Take 1/2 to 1 tablet by mouth every 6  hours as needed for dizziness  Previous Medications   ATORVASTATIN (LIPITOR) 10 MG TABLET    Take 10 mg by mouth daily.   DONEPEZIL (ARICEPT) 5 MG TABLET    Take 1 tablet (5 mg total) by mouth at bedtime as needed.   DORZOLAMIDE-TIMOLOL (COSOPT) 22.3-6.8 MG/ML OPHTHALMIC SOLUTION    Place 1 drop into both eyes Three times a day.   ESOMEPRAZOLE (NEXIUM) 40 MG CAPSULE    Take 1 capsule (40 mg total) by mouth daily.   FELODIPINE (PLENDIL) 5 MG 24 HR TABLET    TAKE 1 TABLET (5 MG TOTAL) BY MOUTH DAILY.   LACTOSE FREE NUTRITION (BOOST) LIQD    1 drink 2-3 times daily   LORAZEPAM (ATIVAN) 0.5 MG TABLET    Take 0.5 mg by mouth at bedtime.   LOSARTAN-HYDROCHLOROTHIAZIDE (HYZAAR) 100-25 MG PER TABLET    TAKE 1 TABLET EVERY DAY   MELOXICAM (MOBIC) 7.5 MG TABLET    Take 7.5 mg by mouth daily as needed. For arthritis pain   METOPROLOL SUCCINATE (TOPROL-XL) 50 MG 24 HR TABLET    TAKE 1 TABLET BY MOUTH DAILY   MULTIPLE VITAMINS-MINERALS (CENTRUM SILVER ULTRA WOMENS PO)    Take 1 tablet by mouth daily.   SERTRALINE (ZOLOFT) 100 MG TABLET    TAKE 1 TABLET (100 MG TOTAL) BY MOUTH DAILY.  Modified Medications   No medications on file  Discontinued Medications   No medications on file

## 2013-08-14 ENCOUNTER — Other Ambulatory Visit: Payer: Self-pay | Admitting: Pulmonary Disease

## 2013-09-01 ENCOUNTER — Telehealth: Payer: Self-pay | Admitting: Pulmonary Disease

## 2013-09-01 NOTE — Telephone Encounter (Signed)
Called and spoke with pt and she stated that she had a house guest not long ago and when her house guest left, this person took all of her medications with her.  Pt stated that SN is aware of her irregular HR and she is followed by Dr. Eden EmmsNishan for this---last ov was 05/2013 and told to follow back up in 6 months.  [facility] stated that the pts weight today was 120---pt was 132 at her last ov with SN on 07/2013.     Spoke with SN and recs are:  We can refill her meds if needed to the pharmacy--pt stated that she has refills and she will call the pharmacy and have these meds refilled.  Pt will need to follow up with Dr. Eden EmmsNishan on her irregular HR Pt will need to make sure she is doing 3 meals a day and 2 boost daily.   Pt is aware to follow up with Dr. Eden EmmsNishan for any concerns and she will have [facility] continue to monitor her weight at home.

## 2013-10-09 ENCOUNTER — Other Ambulatory Visit: Payer: Self-pay | Admitting: Pulmonary Disease

## 2013-10-14 ENCOUNTER — Other Ambulatory Visit: Payer: Self-pay | Admitting: Pulmonary Disease

## 2013-10-14 DIAGNOSIS — K449 Diaphragmatic hernia without obstruction or gangrene: Secondary | ICD-10-CM

## 2013-10-14 DIAGNOSIS — E559 Vitamin D deficiency, unspecified: Secondary | ICD-10-CM

## 2013-10-19 ENCOUNTER — Telehealth: Payer: Self-pay | Admitting: Pulmonary Disease

## 2013-10-19 NOTE — Telephone Encounter (Signed)
Called spoke with pt. She is needing the # to [facility]. She reports she is returning a call to them. Nothing further needed

## 2013-12-06 ENCOUNTER — Ambulatory Visit: Payer: Medicare Other | Admitting: Pulmonary Disease

## 2013-12-08 ENCOUNTER — Ambulatory Visit: Payer: Medicare Other | Admitting: Physician Assistant

## 2013-12-09 ENCOUNTER — Ambulatory Visit (INDEPENDENT_AMBULATORY_CARE_PROVIDER_SITE_OTHER): Payer: Medicare Other | Admitting: Internal Medicine

## 2013-12-09 ENCOUNTER — Encounter: Payer: Self-pay | Admitting: Internal Medicine

## 2013-12-09 ENCOUNTER — Other Ambulatory Visit (INDEPENDENT_AMBULATORY_CARE_PROVIDER_SITE_OTHER): Payer: Medicare Other

## 2013-12-09 VITALS — BP 106/70 | HR 65 | Temp 99.0°F | Resp 16 | Ht 64.0 in | Wt 120.0 lb

## 2013-12-09 DIAGNOSIS — I2510 Atherosclerotic heart disease of native coronary artery without angina pectoris: Secondary | ICD-10-CM

## 2013-12-09 DIAGNOSIS — E785 Hyperlipidemia, unspecified: Secondary | ICD-10-CM

## 2013-12-09 DIAGNOSIS — Z0000 Encounter for general adult medical examination without abnormal findings: Secondary | ICD-10-CM | POA: Insufficient documentation

## 2013-12-09 DIAGNOSIS — Z23 Encounter for immunization: Secondary | ICD-10-CM

## 2013-12-09 DIAGNOSIS — I10 Essential (primary) hypertension: Secondary | ICD-10-CM

## 2013-12-09 DIAGNOSIS — G4733 Obstructive sleep apnea (adult) (pediatric): Secondary | ICD-10-CM

## 2013-12-09 LAB — LIPID PANEL
Cholesterol: 211 mg/dL — ABNORMAL HIGH (ref 0–200)
HDL: 63.3 mg/dL (ref >39.00)
LDL CALC: 133 mg/dL — AB (ref 0–99)
Total CHOL/HDL Ratio: 3
Triglycerides: 74 mg/dL (ref 0.0–149.0)
VLDL: 14.8 mg/dL (ref 0.0–40.0)

## 2013-12-09 LAB — CBC WITH DIFFERENTIAL/PLATELET
Basophils Absolute: 0 10*3/uL (ref 0.0–0.1)
Basophils Relative: 0.4 % (ref 0.0–3.0)
EOS PCT: 0.6 % (ref 0.0–5.0)
Eosinophils Absolute: 0 10*3/uL (ref 0.0–0.7)
HCT: 44 % (ref 36.0–46.0)
HEMOGLOBIN: 14.3 g/dL (ref 12.0–15.0)
LYMPHS ABS: 1.4 10*3/uL (ref 0.7–4.0)
Lymphocytes Relative: 20.2 % (ref 12.0–46.0)
MCHC: 32.5 g/dL (ref 30.0–36.0)
MCV: 91.7 fl (ref 78.0–100.0)
Monocytes Absolute: 0.5 10*3/uL (ref 0.1–1.0)
Monocytes Relative: 7.4 % (ref 3.0–12.0)
NEUTROS ABS: 4.9 10*3/uL (ref 1.4–7.7)
Neutrophils Relative %: 71.4 % (ref 43.0–77.0)
Platelets: 317 10*3/uL (ref 150.0–400.0)
RBC: 4.8 Mil/uL (ref 3.87–5.11)
RDW: 15.4 % (ref 11.5–15.5)
WBC: 6.9 10*3/uL (ref 4.0–10.5)

## 2013-12-09 LAB — COMPREHENSIVE METABOLIC PANEL
ALBUMIN: 4 g/dL (ref 3.5–5.2)
ALT: 12 U/L (ref 0–35)
AST: 23 U/L (ref 0–37)
Alkaline Phosphatase: 46 U/L (ref 39–117)
BUN: 14 mg/dL (ref 6–23)
CHLORIDE: 104 meq/L (ref 96–112)
CO2: 31 meq/L (ref 19–32)
Calcium: 10.4 mg/dL (ref 8.4–10.5)
Creatinine, Ser: 0.9 mg/dL (ref 0.4–1.2)
GFR: 78.71 mL/min (ref >60.00)
GLUCOSE: 101 mg/dL — AB (ref 70–99)
POTASSIUM: 4.2 meq/L (ref 3.5–5.1)
SODIUM: 141 meq/L (ref 135–145)
TOTAL PROTEIN: 7.6 g/dL (ref 6.0–8.3)
Total Bilirubin: 1.5 mg/dL — ABNORMAL HIGH (ref 0.2–1.2)

## 2013-12-09 LAB — TSH: TSH: 0.97 u[IU]/mL (ref 0.35–4.50)

## 2013-12-09 LAB — HM DEXA SCAN

## 2013-12-09 NOTE — Assessment & Plan Note (Addendum)

## 2013-12-09 NOTE — Progress Notes (Signed)
Pre-visit discussion using our clinic review tool. No additional management support is needed unless otherwise documented below in the visit note.  

## 2013-12-09 NOTE — Assessment & Plan Note (Signed)
She is doing well on lipitor I will check her FLP today 

## 2013-12-09 NOTE — Patient Instructions (Signed)

## 2013-12-09 NOTE — Progress Notes (Signed)
   Subjective:    Patient ID: [name], female    DOB: 12/24/1927, 80 y.o.   MRN: [id number]  Hypertension This is a chronic problem. The current episode started more than 1 year ago. The problem is unchanged. The problem is controlled. Pertinent negatives include no anxiety, blurred vision, chest pain, headaches, malaise/fatigue, neck pain, orthopnea, palpitations, peripheral edema, PND, shortness of breath or sweats. There are no associated agents to hypertension. Past treatments include beta blockers, angiotensin blockers, diuretics and calcium channel blockers. The current treatment provides moderate improvement. There are no compliance problems.       Review of Systems  Constitutional: Negative.  Negative for fever, chills, malaise/fatigue, diaphoresis, appetite change and fatigue.  HENT: Negative.   Eyes: Negative.  Negative for blurred vision.  Respiratory: Negative.  Negative for cough, choking, chest tightness, shortness of breath, wheezing and stridor.   Cardiovascular: Negative.  Negative for chest pain, palpitations, orthopnea, leg swelling and PND.  Gastrointestinal: Negative.  Negative for nausea, vomiting, abdominal pain, diarrhea, constipation and blood in stool.  Endocrine: Negative.   Genitourinary: Negative.   Musculoskeletal: Negative.  Negative for arthralgias, back pain, myalgias, neck pain and neck stiffness.  Skin: Negative.   Allergic/Immunologic: Negative.   Neurological: Negative.  Negative for dizziness, tremors, weakness, light-headedness and headaches.  Hematological: Negative.  Negative for adenopathy. Does not bruise/bleed easily.  Psychiatric/Behavioral: Negative.        Objective:   Physical Exam  Vitals reviewed. Constitutional: She is oriented to person, place, and time. She appears well-developed and well-nourished. No distress.  HENT:  Head: Normocephalic and atraumatic.  Mouth/Throat: Oropharynx is clear and moist. No oropharyngeal  exudate.  Eyes: Conjunctivae are normal. Right eye exhibits no discharge. Left eye exhibits no discharge. No scleral icterus.  Neck: Normal range of motion. Neck supple. No JVD present. No tracheal deviation present. No thyromegaly present.  Cardiovascular: Normal rate, regular rhythm, normal heart sounds and intact distal pulses.  Exam reveals no gallop and no friction rub.   No murmur heard. Pulmonary/Chest: Effort normal and breath sounds normal. No stridor. No respiratory distress. She has no wheezes. She has no rales. She exhibits no tenderness.  Abdominal: Soft. Bowel sounds are normal. She exhibits no distension and no mass. There is no tenderness. There is no rebound and no guarding.  Musculoskeletal: Normal range of motion. She exhibits no edema and no tenderness.  Lymphadenopathy:    She has no cervical adenopathy.  Neurological: She is oriented to person, place, and time.  Skin: Skin is warm and dry. No rash noted. She is not diaphoretic. No erythema. No pallor.  Psychiatric: She has a normal mood and affect. Her behavior is normal. Judgment and thought content normal.    Lab Results  Component Value Date   WBC 6.1 04/02/2013   HGB 13.6 04/02/2013   HCT 41.5 04/02/2013   PLT 304.0 04/02/2013   GLUCOSE 95 04/02/2013   CHOL 223* 11/11/2011   TRIG 90.0 11/11/2011   HDL 62.40 11/11/2011   LDLDIRECT 140.3 11/11/2011   LDLCALC 86 08/16/2010   ALT 14 04/02/2013   AST 22 04/02/2013   NA 139 04/02/2013   K 4.2 04/02/2013   CL 105 04/02/2013   CREATININE 0.8 04/02/2013   BUN 20 04/02/2013   CO2 31 04/02/2013   TSH 1.01 04/02/2013   INR 1.1 02/09/2008        Assessment & Plan:

## 2013-12-09 NOTE — Assessment & Plan Note (Signed)
Her BP is well controlled I will check her lytes and renal function today 

## 2013-12-11 ENCOUNTER — Other Ambulatory Visit: Payer: Self-pay | Admitting: Pulmonary Disease

## 2014-04-06 ENCOUNTER — Ambulatory Visit: Payer: Medicare Other | Admitting: Internal Medicine

## 2014-04-07 ENCOUNTER — Telehealth: Payer: Self-pay | Admitting: Internal Medicine

## 2014-04-07 NOTE — Telephone Encounter (Signed)
Patient no showed for evaluation for assistance on 9/2.  Please advise.

## 2014-06-13 ENCOUNTER — Ambulatory Visit (INDEPENDENT_AMBULATORY_CARE_PROVIDER_SITE_OTHER): Payer: Medicare Other | Admitting: Internal Medicine

## 2014-06-13 ENCOUNTER — Ambulatory Visit: Payer: Medicare Other | Admitting: Internal Medicine

## 2014-06-13 ENCOUNTER — Encounter: Payer: Self-pay | Admitting: Internal Medicine

## 2014-06-13 ENCOUNTER — Other Ambulatory Visit (INDEPENDENT_AMBULATORY_CARE_PROVIDER_SITE_OTHER): Payer: Medicare Other

## 2014-06-13 VITALS — BP 140/80 | HR 64 | Temp 98.0°F | Ht 64.0 in | Wt 119.0 lb

## 2014-06-13 DIAGNOSIS — I2510 Atherosclerotic heart disease of native coronary artery without angina pectoris: Secondary | ICD-10-CM

## 2014-06-13 DIAGNOSIS — Z23 Encounter for immunization: Secondary | ICD-10-CM

## 2014-06-13 DIAGNOSIS — Z0000 Encounter for general adult medical examination without abnormal findings: Secondary | ICD-10-CM

## 2014-06-13 DIAGNOSIS — I10 Essential (primary) hypertension: Secondary | ICD-10-CM

## 2014-06-13 LAB — BASIC METABOLIC PANEL
BUN: 17 mg/dL (ref 6–23)
CO2: 30 mEq/L (ref 19–32)
Calcium: 10.3 mg/dL (ref 8.4–10.5)
Chloride: 105 mEq/L (ref 96–112)
Creatinine, Ser: 1 mg/dL (ref 0.4–1.2)
GFR: 70.26 mL/min (ref >60.00)
Glucose, Bld: 107 mg/dL — ABNORMAL HIGH (ref 70–99)
Potassium: 4.1 mEq/L (ref 3.5–5.1)
Sodium: 142 mEq/L (ref 135–145)

## 2014-06-13 NOTE — Progress Notes (Signed)
Pre visit review using our clinic review tool, if applicable. No additional management support is needed unless otherwise documented below in the visit note. 

## 2014-06-13 NOTE — Patient Instructions (Signed)

## 2014-06-13 NOTE — Assessment & Plan Note (Signed)
She has no s/s related to this Will follow for now 

## 2014-06-13 NOTE — Assessment & Plan Note (Signed)
Her BP is well controlled I will monitor her lytes and renal function today 

## 2014-06-13 NOTE — Progress Notes (Signed)
   Subjective:    Patient ID: [name], female    DOB: Aug 20, 1927, 80 y.o.   MRN: [id number]  Hypertension This is a chronic problem. The current episode started more than 1 year ago. The problem has been gradually improving since onset. The problem is controlled. Pertinent negatives include no anxiety, blurred vision, chest pain, headaches, malaise/fatigue, neck pain, orthopnea, palpitations, peripheral edema, PND, shortness of breath or sweats. There are no associated agents to hypertension. Past treatments include angiotensin blockers, diuretics, calcium channel blockers and beta blockers. The current treatment provides significant improvement. There are no compliance problems.       Review of Systems  Constitutional: Negative.  Negative for fever, chills, malaise/fatigue, diaphoresis, appetite change and fatigue.  HENT: Negative.   Eyes: Negative.  Negative for blurred vision.  Respiratory: Negative.  Negative for apnea, cough, choking, chest tightness, shortness of breath, wheezing and stridor.   Cardiovascular: Negative.  Negative for chest pain, palpitations, orthopnea, leg swelling and PND.  Gastrointestinal: Negative.  Negative for nausea, vomiting, abdominal pain, diarrhea, constipation and blood in stool.  Endocrine: Negative.   Genitourinary: Negative.   Musculoskeletal: Negative.  Negative for myalgias, back pain, joint swelling, arthralgias and neck pain.  Skin: Negative.  Negative for rash.  Allergic/Immunologic: Negative.   Neurological: Negative.  Negative for headaches.  Hematological: Negative.  Negative for adenopathy. Does not bruise/bleed easily.  Psychiatric/Behavioral: Negative.        Objective:   Physical Exam  Constitutional: She is oriented to person, place, and time. She appears well-developed and well-nourished. No distress.  HENT:  Head: Normocephalic and atraumatic.  Mouth/Throat: Oropharynx is clear and moist. No oropharyngeal exudate.  Eyes:  Conjunctivae are normal. Right eye exhibits no discharge. Left eye exhibits no discharge. No scleral icterus.  Neck: Normal range of motion. Neck supple. No JVD present. No tracheal deviation present. No thyromegaly present.  Cardiovascular: Normal rate, regular rhythm, normal heart sounds and intact distal pulses.  Exam reveals no gallop and no friction rub.   No murmur heard. Pulmonary/Chest: Effort normal and breath sounds normal. No stridor. No respiratory distress. She has no wheezes. She has no rales. She exhibits no tenderness.  Abdominal: Soft. Bowel sounds are normal. She exhibits no distension and no mass. There is no tenderness. There is no rebound and no guarding.  Musculoskeletal: Normal range of motion. She exhibits no edema or tenderness.  Lymphadenopathy:    She has no cervical adenopathy.  Neurological: She is oriented to person, place, and time.  Skin: Skin is warm and dry. No rash noted. She is not diaphoretic. No erythema. No pallor.  Psychiatric: She has a normal mood and affect. Her behavior is normal. Judgment and thought content normal.  Vitals reviewed.    Lab Results  Component Value Date   WBC 6.9 12/09/2013   HGB 14.3 12/09/2013   HCT 44.0 12/09/2013   PLT 317.0 12/09/2013   GLUCOSE 101* 12/09/2013   CHOL 211* 12/09/2013   TRIG 74.0 12/09/2013   HDL 63.30 12/09/2013   LDLDIRECT 140.3 11/11/2011   LDLCALC 133* 12/09/2013   ALT 12 12/09/2013   AST 23 12/09/2013   NA 141 12/09/2013   K 4.2 12/09/2013   CL 104 12/09/2013   CREATININE 0.9 12/09/2013   BUN 14 12/09/2013   CO2 31 12/09/2013   TSH 0.97 12/09/2013   INR 1.1 02/09/2008       Assessment & Plan:

## 2014-07-25 ENCOUNTER — Other Ambulatory Visit: Payer: Self-pay | Admitting: Pulmonary Disease

## 2014-07-27 ENCOUNTER — Encounter: Payer: Self-pay | Admitting: Internal Medicine

## 2014-07-27 ENCOUNTER — Ambulatory Visit (INDEPENDENT_AMBULATORY_CARE_PROVIDER_SITE_OTHER): Payer: Medicare Other | Admitting: Internal Medicine

## 2014-07-27 VITALS — BP 110/70 | HR 68 | Temp 98.3°F | Ht 65.5 in | Wt 119.0 lb

## 2014-07-27 DIAGNOSIS — F03918 Unspecified dementia, unspecified severity, with other behavioral disturbance: Secondary | ICD-10-CM

## 2014-07-27 DIAGNOSIS — E559 Vitamin D deficiency, unspecified: Secondary | ICD-10-CM

## 2014-07-27 DIAGNOSIS — F0391 Unspecified dementia with behavioral disturbance: Secondary | ICD-10-CM

## 2014-07-27 DIAGNOSIS — K219 Gastro-esophageal reflux disease without esophagitis: Secondary | ICD-10-CM

## 2014-07-27 DIAGNOSIS — I10 Essential (primary) hypertension: Secondary | ICD-10-CM

## 2014-07-27 DIAGNOSIS — E785 Hyperlipidemia, unspecified: Secondary | ICD-10-CM

## 2014-07-27 MED ORDER — ESOMEPRAZOLE MAGNESIUM 40 MG PO CPDR: 40.0000 mg | DELAYED_RELEASE_CAPSULE | Freq: Every day | ORAL | Status: DC

## 2014-07-27 MED ORDER — FELODIPINE ER 5 MG PO TB24: ORAL_TABLET | ORAL | Status: DC

## 2014-07-27 MED ORDER — LOSARTAN POTASSIUM-HCTZ 100-25 MG PO TABS: 1.0000 | ORAL_TABLET | Freq: Every day | ORAL | Status: DC

## 2014-07-27 MED ORDER — ATORVASTATIN CALCIUM 10 MG PO TABS: 10.0000 mg | ORAL_TABLET | Freq: Every day | ORAL | Status: DC

## 2014-07-27 MED ORDER — METOPROLOL SUCCINATE ER 50 MG PO TB24: 50.0000 mg | ORAL_TABLET | Freq: Every day | ORAL | Status: DC

## 2014-07-27 MED ORDER — MEMANTINE HCL-DONEPEZIL HCL ER 28-10 MG PO CP24: 1.0000 | ORAL_CAPSULE | Freq: Every day | ORAL | Status: DC

## 2014-07-27 NOTE — Progress Notes (Signed)
Subjective:    Patient ID: [name], female    DOB: 1928-03-25, 80 y.o.   MRN: [id number]  Hypertension This is a chronic problem. The current episode started more than 1 year ago. The problem has been gradually worsening since onset. The problem is uncontrolled. Pertinent negatives include no anxiety, blurred vision, chest pain, headaches, malaise/fatigue, neck pain, orthopnea, palpitations, peripheral edema, PND, shortness of breath or sweats. Past treatments include calcium channel blockers, angiotensin blockers, diuretics and beta blockers. The current treatment provides mild improvement. Compliance problems include psychosocial issues (she forgets to take her meds).       Review of Systems  Constitutional: Negative.  Negative for fever, chills, malaise/fatigue, diaphoresis, appetite change and fatigue.  HENT: Negative.   Eyes: Negative.  Negative for blurred vision.  Respiratory: Negative.  Negative for cough, choking, chest tightness and shortness of breath.   Cardiovascular: Negative.  Negative for chest pain, palpitations, orthopnea and PND.  Gastrointestinal: Negative.  Negative for nausea, vomiting, abdominal pain, diarrhea, constipation and blood in stool.  Endocrine: Negative.   Genitourinary: Negative.   Musculoskeletal: Negative.  Negative for neck pain.  Skin: Negative.   Allergic/Immunologic: Negative.   Neurological: Negative.  Negative for headaches.  Hematological: Negative.  Negative for adenopathy.  Psychiatric/Behavioral: Positive for confusion and decreased concentration. Negative for suicidal ideas, hallucinations, behavioral problems, sleep disturbance, self-injury, dysphoric mood and agitation. The patient is not nervous/anxious and is not hyperactive.        She has worsening memory, confusion, and forgets to take meds       Objective:   Physical Exam  Constitutional: She is oriented to person, place, and time. She appears well-developed and  well-nourished. No distress.  HENT:  Head: Normocephalic and atraumatic.  Mouth/Throat: Oropharynx is clear and moist. No oropharyngeal exudate.  Eyes: Conjunctivae are normal. Right eye exhibits no discharge. Left eye exhibits no discharge. No scleral icterus.  Neck: Normal range of motion. Neck supple. No JVD present. No tracheal deviation present. No thyromegaly present.  Cardiovascular: Normal rate, regular rhythm, normal heart sounds and intact distal pulses.  Exam reveals no gallop and no friction rub.   No murmur heard. Pulmonary/Chest: Effort normal and breath sounds normal. No stridor. No respiratory distress. She has no wheezes. She has no rales. She exhibits no tenderness.  Abdominal: Soft. Bowel sounds are normal. She exhibits no distension and no mass. There is no tenderness. There is no rebound and no guarding.  Musculoskeletal: Normal range of motion. She exhibits no edema or tenderness.  Lymphadenopathy:    She has no cervical adenopathy.  Neurological: She is oriented to person, place, and time.  Skin: Skin is warm and dry. No rash noted. She is not diaphoretic. No erythema. No pallor.  Psychiatric: She has a normal mood and affect. Judgment and thought content normal. Her mood appears not anxious. Her affect is not angry, not blunt, not labile and not inappropriate. Her speech is delayed. Her speech is not rapid and/or pressured, not tangential and not slurred. She is not slowed, not withdrawn and not actively hallucinating. Cognition and memory are impaired. She does not exhibit a depressed mood. She expresses no homicidal and no suicidal ideation. She expresses no suicidal plans and no homicidal plans. She is communicative. She exhibits abnormal recent memory and abnormal remote memory. She is inattentive.  Vitals reviewed.    Lab Results  Component Value Date   WBC 6.9 12/09/2013   HGB 14.3 12/09/2013   HCT  44.0 12/09/2013   PLT 317.0 12/09/2013   GLUCOSE 107*  06/13/2014   CHOL 211* 12/09/2013   TRIG 74.0 12/09/2013   HDL 63.30 12/09/2013   LDLDIRECT 140.3 11/11/2011   LDLCALC 133* 12/09/2013   ALT 12 12/09/2013   AST 23 12/09/2013   NA 142 06/13/2014   K 4.1 06/13/2014   CL 105 06/13/2014   CREATININE 1.0 06/13/2014   BUN 17 06/13/2014   CO2 30 06/13/2014   TSH 0.97 12/09/2013   INR 1.1 02/09/2008       Assessment & Plan:

## 2014-07-27 NOTE — Assessment & Plan Note (Signed)
Her BP is not well controlled, she has not been compliant with her meds There is family with her today that will help with this Will also see if [facility] can help her

## 2014-07-27 NOTE — Assessment & Plan Note (Signed)
She has achieved her LDL goal 

## 2014-07-27 NOTE — Assessment & Plan Note (Signed)
She has not improved much with aricept, will increase the dose to 10 mg and will add namenda with namzaric, she was given samples to try

## 2014-07-27 NOTE — Patient Instructions (Signed)

## 2014-07-28 ENCOUNTER — Telehealth: Payer: Self-pay | Admitting: Internal Medicine

## 2014-07-28 NOTE — Telephone Encounter (Signed)
emmi emailed °

## 2014-08-12 ENCOUNTER — Ambulatory Visit: Payer: Self-pay | Admitting: Internal Medicine

## 2014-08-17 ENCOUNTER — Ambulatory Visit (INDEPENDENT_AMBULATORY_CARE_PROVIDER_SITE_OTHER): Payer: Medicare Other | Admitting: Internal Medicine

## 2014-08-17 ENCOUNTER — Encounter: Payer: Self-pay | Admitting: Internal Medicine

## 2014-08-17 VITALS — BP 116/82 | HR 64 | Temp 98.0°F | Resp 16 | Ht 60.5 in | Wt 122.0 lb

## 2014-08-17 DIAGNOSIS — F03918 Unspecified dementia, unspecified severity, with other behavioral disturbance: Secondary | ICD-10-CM

## 2014-08-17 DIAGNOSIS — F0391 Unspecified dementia with behavioral disturbance: Secondary | ICD-10-CM

## 2014-08-17 DIAGNOSIS — I10 Essential (primary) hypertension: Secondary | ICD-10-CM

## 2014-08-17 NOTE — Assessment & Plan Note (Signed)
FL2 form completed.  

## 2014-08-17 NOTE — Assessment & Plan Note (Signed)
Her BP is well controlled 

## 2014-08-17 NOTE — Progress Notes (Signed)
Pre visit review using our clinic review tool, if applicable. No additional management support is needed unless otherwise documented below in the visit note. 

## 2014-08-17 NOTE — Progress Notes (Signed)
   Subjective:    Patient ID: [name], female    DOB: 1928-05-23, 81 y.o.   MRN: [id number]  HPI Comments: She comes in today with family members to have an FL-2 form completed so that she can [facility] [place]. She feels well and offers no complaints.     Review of Systems  Constitutional: Negative.  Negative for fever, chills, diaphoresis, appetite change and fatigue.  HENT: Negative.   Eyes: Negative.   Respiratory: Negative.  Negative for cough, choking, chest tightness, shortness of breath and stridor.   Cardiovascular: Negative.  Negative for chest pain, palpitations and leg swelling.  Gastrointestinal: Negative.  Negative for abdominal pain.  Endocrine: Negative.   Genitourinary: Negative.   Musculoskeletal: Negative.   Skin: Negative.   Allergic/Immunologic: Negative.   Neurological: Negative.   Hematological: Negative.  Negative for adenopathy. Does not bruise/bleed easily.  Psychiatric/Behavioral: Negative.        Objective:   Physical Exam  Constitutional: She is oriented to person, place, and time. She appears well-developed and well-nourished. No distress.  HENT:  Head: Normocephalic and atraumatic.  Mouth/Throat: Oropharynx is clear and moist. No oropharyngeal exudate.  Eyes: Conjunctivae are normal. Right eye exhibits no discharge. Left eye exhibits no discharge. No scleral icterus.  Neck: Normal range of motion. Neck supple. No JVD present. No tracheal deviation present. No thyromegaly present.  Cardiovascular: Normal rate, regular rhythm, normal heart sounds and intact distal pulses.  Exam reveals no gallop and no friction rub.   No murmur heard. Pulmonary/Chest: Effort normal and breath sounds normal. No stridor. No respiratory distress. She has no wheezes. She has no rales. She exhibits no tenderness.  Abdominal: Soft. Bowel sounds are normal. She exhibits no distension and no mass. There is no tenderness. There is no rebound and no  guarding.  Musculoskeletal: Normal range of motion. She exhibits no edema or tenderness.  Lymphadenopathy:    She has no cervical adenopathy.  Neurological: She is oriented to person, place, and time.  Skin: Skin is warm and dry. No rash noted. She is not diaphoretic. No erythema. No pallor.  Vitals reviewed.   Lab Results  Component Value Date   WBC 6.9 12/09/2013   HGB 14.3 12/09/2013   HCT 44.0 12/09/2013   PLT 317.0 12/09/2013   GLUCOSE 107* 06/13/2014   CHOL 211* 12/09/2013   TRIG 74.0 12/09/2013   HDL 63.30 12/09/2013   LDLDIRECT 140.3 11/11/2011   LDLCALC 133* 12/09/2013   ALT 12 12/09/2013   AST 23 12/09/2013   NA 142 06/13/2014   K 4.1 06/13/2014   CL 105 06/13/2014   CREATININE 1.0 06/13/2014   BUN 17 06/13/2014   CO2 30 06/13/2014   TSH 0.97 12/09/2013   INR 1.1 02/09/2008        Assessment & Plan:

## 2014-08-18 ENCOUNTER — Telehealth: Payer: Self-pay | Admitting: Internal Medicine

## 2014-08-18 NOTE — Telephone Encounter (Signed)
[facility] facility is concerned over DX of dementia. You had advised that you were comfortable recommending pt being able to live in an independent living facility. They are asking that you draft a letterhead stating that you feel she is able to live independently at this point, regardless of dementia DX. I will amend notes once I can get a fax #.

## 2014-08-18 NOTE — Telephone Encounter (Signed)
Pt daughter called in and said that [facility] something from Dr Yetta BarreJones that states that Pt can live independently.  She said that the other paper has dementia  on it and that will not allow her to live alone  [facility] Fax number [phone] Attn Bjorn LoserRhonda Mcbride   [phone]-Daughter  Barbette MerinoElzora Holland

## 2014-08-19 ENCOUNTER — Encounter: Payer: Self-pay | Admitting: Internal Medicine

## 2014-08-23 NOTE — Telephone Encounter (Signed)
Dr. Yetta BarreJones printed and faxed letter on 08/19/2014. Printed and faxed once more.

## 2014-08-23 NOTE — Telephone Encounter (Signed)
Pt daughter called stated they need this letter ASAP, pt's daughter stated she just need a letter head stating that she can live independently. Please advise, pt doesn't want to lose the spot for that place.

## 2014-09-12 ENCOUNTER — Telehealth: Payer: Self-pay | Admitting: *Deleted

## 2014-09-12 ENCOUNTER — Telehealth: Payer: Self-pay | Admitting: Internal Medicine

## 2014-09-12 DIAGNOSIS — M545 Low back pain: Secondary | ICD-10-CM

## 2014-09-12 DIAGNOSIS — I2510 Atherosclerotic heart disease of native coronary artery without angina pectoris: Secondary | ICD-10-CM

## 2014-09-12 DIAGNOSIS — F0391 Unspecified dementia with behavioral disturbance: Secondary | ICD-10-CM

## 2014-09-12 DIAGNOSIS — F03918 Unspecified dementia, unspecified severity, with other behavioral disturbance: Secondary | ICD-10-CM

## 2014-09-12 NOTE — Telephone Encounter (Signed)
Family request home health assessment/referral to help with meds. Thanks

## 2014-09-12 NOTE — Telephone Encounter (Signed)
Ms. [name] would like to know  if an aid can be sent to help the patient with baths and giving her medication until they find a facility to put her in.  She also said they need new scripts for the patient because all of her medicine has been misplaced during the move.

## 2014-09-12 NOTE — Telephone Encounter (Signed)
Patient family member Barbette Merinolzora Holland called to say that the patient needs to be put into a facility because her Dementia is getting worse.  Please call [phone] or [phone]

## 2014-09-12 NOTE — Telephone Encounter (Signed)
[facility] - Client TELEPHONE ADVICE RECORD [facility] Patient Name: Jenny PasturesDAISY Guzman Gender: Female DOB: 11/02/1926 Age: [age] Y 10 M 6 D Return Phone Number: [phone] (Primary) Address: [address] City[place]State/Zip: [facility] [place] [facility] - Client Client Site [employer] [facility] [facility] - Night Physician Sanda LingerJones, Thomas Contact Type Call Call Type Triage / Clinical Caller Name Barbette Merinolzora Holland Relationship To Patient Other relative Return Phone Number [phone] (Primary) Chief Complaint Paging or Request for Consult Initial Comment Caller states a pt has dementia and the pt needs to be in a special facility for long term care for 24 hours. Could the on call guide the caller as to the process to get the pt into a special facility as soon as possible. Nurse Assessment Guidelines Guideline Title Affirmed Question Affirmed Notes Nurse Date/Time (Eastern Time) Disp. Time [name](Eastern Time) Disposition Final User 09/10/2014 4:06:12 PM Attempt made - message left Kennieth RadRalston, RN, Jeffrey 09/10/2014 4:26:21 PM Attempt made - message left Kennieth RadRalston, RN, Jeffrey 09/10/2014 4:51:02 PM FINAL ATTEMPT MADE - message left Yes Debera Latalston, RN, Tinnie GensJeffrey After Care Instructions Given Call Event Type User Date / Time Description

## 2014-09-12 NOTE — Telephone Encounter (Signed)
I have sent a referral.

## 2014-09-12 NOTE — Telephone Encounter (Signed)
The family will have to find the facility that works for her/them

## 2014-09-14 ENCOUNTER — Telehealth: Payer: Self-pay

## 2014-09-14 DIAGNOSIS — F03918 Unspecified dementia, unspecified severity, with other behavioral disturbance: Secondary | ICD-10-CM

## 2014-09-14 DIAGNOSIS — F0391 Unspecified dementia with behavioral disturbance: Secondary | ICD-10-CM

## 2014-09-14 MED ORDER — MEMANTINE HCL ER 28 MG PO CP24: 28.0000 mg | ORAL_CAPSULE | Freq: Every day | ORAL | Status: DC

## 2014-09-14 MED ORDER — DONEPEZIL HCL 10 MG PO TABS: 10.0000 mg | ORAL_TABLET | Freq: Every day | ORAL | Status: DC

## 2014-09-14 NOTE — Telephone Encounter (Signed)
Changed, hopefully this is less expensive

## 2014-09-14 NOTE — Telephone Encounter (Signed)
Home health nurse called stating that pt has not been taking any type of medication (aricept or namenda) for memory and need to verify if she should. I advised that Namzeric was sent to pharmacy 07/27/14. Nurse than called back after checking with the pharmacy and told pt could not afford the 1800$ cost.

## 2014-09-14 NOTE — Telephone Encounter (Signed)
Alvino ChapelEllen, notified

## 2014-09-16 ENCOUNTER — Telehealth: Payer: Self-pay | Admitting: Internal Medicine

## 2014-09-16 DIAGNOSIS — I10 Essential (primary) hypertension: Secondary | ICD-10-CM

## 2014-09-16 DIAGNOSIS — K219 Gastro-esophageal reflux disease without esophagitis: Secondary | ICD-10-CM

## 2014-09-16 MED ORDER — METOPROLOL SUCCINATE ER 50 MG PO TB24: 50.0000 mg | ORAL_TABLET | Freq: Every day | ORAL | Status: DC

## 2014-09-16 MED ORDER — ESOMEPRAZOLE MAGNESIUM 40 MG PO CPDR: 40.0000 mg | DELAYED_RELEASE_CAPSULE | Freq: Every day | ORAL | Status: DC

## 2014-09-16 NOTE — Telephone Encounter (Signed)
Pt has missed place her metoprolol and her Nexium.  Her blood pressure is ok Sherren MochaEllen [place] [phone]

## 2014-09-16 NOTE — Telephone Encounter (Signed)
RX resent

## 2014-09-24 ENCOUNTER — Encounter: Payer: Self-pay | Admitting: Internal Medicine

## 2014-09-24 ENCOUNTER — Ambulatory Visit (INDEPENDENT_AMBULATORY_CARE_PROVIDER_SITE_OTHER): Payer: Medicare Other | Admitting: Internal Medicine

## 2014-09-24 VITALS — BP 140/80 | HR 64 | Temp 98.6°F | Ht 60.5 in | Wt 117.0 lb

## 2014-09-24 DIAGNOSIS — T887XXA Unspecified adverse effect of drug or medicament, initial encounter: Secondary | ICD-10-CM

## 2014-09-24 DIAGNOSIS — IMO0001 Reserved for inherently not codable concepts without codable children: Secondary | ICD-10-CM

## 2014-09-24 DIAGNOSIS — R54 Age-related physical debility: Secondary | ICD-10-CM

## 2014-09-24 DIAGNOSIS — I10 Essential (primary) hypertension: Secondary | ICD-10-CM

## 2014-09-24 DIAGNOSIS — R42 Dizziness and giddiness: Secondary | ICD-10-CM

## 2014-09-24 DIAGNOSIS — F0391 Unspecified dementia with behavioral disturbance: Secondary | ICD-10-CM

## 2014-09-24 DIAGNOSIS — F03918 Unspecified dementia, unspecified severity, with other behavioral disturbance: Secondary | ICD-10-CM

## 2014-09-24 DIAGNOSIS — T50905A Adverse effect of unspecified drugs, medicaments and biological substances, initial encounter: Secondary | ICD-10-CM

## 2014-09-24 NOTE — Progress Notes (Signed)
Pre visit review using our clinic review tool, if applicable. No additional management support is needed unless otherwise documented below in the visit note.   Chief Complaint  Patient presents with  . Medication Refill    C/O side effects of Aricept &/or Namenda XR. C/O leg weakness, unsteadiness, dizziness, & fatigue    HPI: Patient comes in today for [facility] Saturday clinic for  new problem evaluation.  [name]  81 y.o.  With hx dementia ht cad  Ht here with caretaker Truddie Coco who has recently been hired to help care .and disc with [facility] Zara Arvella Nigh .  "new medicine  " Namenda 28 xr and aricept 10 begun feb 9th and since then has been more foggy and dizzy unsteady on feet and vision blurry . Not right  No vomiting  Increase apin  Stopped today  Not worse  . Concern that medication is causing the problem . patient had been taking her own medication until  Caretaker helping to take what is on bottles .   taking same bp med no other changes reported . Patient states that this unsteadiness" is not her "  . Caretaker also gives me house calls sheet  From the insurance provider   ROS: See pertinent positives and negatives per HPI. No syncope poss falling no injury  Knees feel weak never before   Uses cane  Living in independent living facility now  Past Medical History  Diagnosis Date  . Glaucoma   . Hearing loss   . Bronchitis, mucopurulent recurrent   . Hypertension   . CAD (coronary artery disease)   . Palpitations   . Hypercholesteremia   . Thyroid nodule   . GERD (gastroesophageal reflux disease)   . Diverticulosis of colon   . Personal history of other diseases of digestive system   . Hx of adenomatous colonic polyps   . Lumbar back pain   . Vitamin D deficiency   . Abnormality of gait   . Anxiety   . Anemia   . Dysrhythmia     OCC HEART PALPITATION DR [name]  . Depression   . OSA (obstructive sleep apnea)     CPAP  MANY YRS AGO NOT SURE WHERE DONE  .  Hiatal hernia   . DJD (degenerative joint disease)   . Chronic osteomyelitis, other specified site     No family history on file.  History   Social History  . Marital Status: Widowed    Spouse Name: N/A  . Number of Children: 2  . Years of Education: N/A   Occupational History  .     Social History Main Topics  . Smoking status: Never Smoker   . Smokeless tobacco: Never Used  . Alcohol Use: No  . Drug Use: No  . Sexual Activity: Not on file   Other Topics Concern  . None   Social History Narrative    Outpatient Encounter Prescriptions as of 09/24/2014  Medication Sig  . atorvastatin (LIPITOR) 10 MG tablet Take 1 tablet (10 mg total) by mouth daily.  . COMBIGAN 0.2-0.5 % ophthalmic solution   . donepezil (ARICEPT) 10 MG tablet Take 1 tablet (10 mg total) by mouth at bedtime.  . dorzolamide-timolol (COSOPT) 22.3-6.8 MG/ML ophthalmic solution Place 1 drop into both eyes Three times a day.  . esomeprazole (NEXIUM) 40 MG capsule Take 1 capsule (40 mg total) by mouth daily. (Patient not taking: Reported on 09/24/2014)  . felodipine (PLENDIL) 5 MG 24 hr  tablet TAKE 1 TABLET (5 MG TOTAL) BY MOUTH DAILY.  [name] lactose free nutrition (BOOST) LIQD 1 drink 2-3 times daily  . losartan-hydrochlorothiazide (HYZAAR) 100-25 MG per tablet Take 1 tablet by mouth daily.  . memantine (NAMENDA XR) 28 MG CP24 24 hr capsule Take 1 capsule (28 mg total) by mouth daily.  . metoprolol succinate (TOPROL-XL) 50 MG 24 hr tablet Take 1 tablet (50 mg total) by mouth daily. Take with or immediately following a meal.  . Multiple Vitamins-Minerals (CENTRUM SILVER ULTRA WOMENS PO) Take 1 tablet by mouth daily.    EXAM:  BP 140/80 mmHg  Pulse 64  Temp(Src) 98.6 F (37 C) (Oral)  Ht 5' 0.5" (1.537 m)  Wt 117 lb (53.071 kg)  BMI 22.47 kg/m2  SpO2 98%  Body mass index is 22.47 kg/(m^2).  GENERAL: vitals reviewed and listed above, alert, oriented, appears well hydrated and in no acute distress elderly  alert conversant in nad non toxic  HEENT: atraumatic, conjunctiva  clear, no obvious abnormalities on inspection of external nose and ears  Wax in eacsOP : no lesion edema or exudate  Tongue midline NECK: no obvious masses on inspection palpation  LUNGS: clear to auscultation bilaterally, no wheezes, rales or rhonchi, good air movement CV: HRRR, no clubbing cyanosis or  peripheral edema nl cap refill  MS: moves all extremities without noticeable focal  Abnormality Neuro non focal  Unsteady gait able to arise on own and walk with cane gingerly  PSYCH:  cooperative, no obvious depression or anxiety looks concerned  Bottle examined ASSESSMENT AND PLAN:  Discussed the following assessment and plan:  Dizziness and giddiness  Medication side effect, initial encounter  Dementia with behavioral disturbance  Age factor  Essential hypertension Poss se of med  What ever the med list says patient was giving her own meds before this month and says she "wasn't taking  aricept and namenda"  Unknown  what she was taking   NO med check documented  . As of feb 10th she is taking as per caretaker and if so certainly can cause se  If not titrated dose .  At this time stay off the med make appt with pcp. For next step. Consider labs eval for metabolic parameters but non focal exam today and no obv infection.  Talked with [facility] on phone about plan . She asked about getting medical  documentation to be able to break the current lease  For her current  residence .  This should be discussed with PCP ; not able to resolve this   on Saturday clinic  -Patient advised to return or notify health care team  if symptoms worsen ,persist or new concerns arise. In the interim.  Total visit > 50% spent counseling and coordinating care   Patient Instructions  Medication could certainly  Cause symptoms and uncertain what changes  Could be this .  Stop the medicine for the next 2 days and then fu with Dr Yetta Barre about  next step.   Important to know what meds are taken and when . Sometimes  Can take too much nad not know it .  Please contact Dr Yetta Barre and make appt with him .      Neta Mends. Panosh M.D.  Lab Results  Component Value Date   WBC 6.9 12/09/2013   HGB 14.3 12/09/2013   HCT 44.0 12/09/2013   PLT 317.0 12/09/2013   GLUCOSE 107* 06/13/2014   CHOL 211* 12/09/2013   TRIG  74.0 12/09/2013   HDL 63.30 12/09/2013   LDLDIRECT 140.3 11/11/2011   LDLCALC 133* 12/09/2013   ALT 12 12/09/2013   AST 23 12/09/2013   NA 142 06/13/2014   K 4.1 06/13/2014   CL 105 06/13/2014   CREATININE 1.0 06/13/2014   BUN 17 06/13/2014   CO2 30 06/13/2014   TSH 0.97 12/09/2013   INR 1.1 02/09/2008

## 2014-09-24 NOTE — Patient Instructions (Addendum)
Medication could certainly  Cause symptoms and uncertain what changes  Could be this .  Stop the medicine for the next 2 days and then fu with Dr Yetta BarreJones about next step.   Important to know what meds are taken and when . Sometimes  Can take too much nad not know it .  Please contact Dr Yetta BarreJones and make appt with him .

## 2014-09-26 ENCOUNTER — Telehealth: Payer: Self-pay | Admitting: Internal Medicine

## 2014-09-26 NOTE — Telephone Encounter (Signed)
Patient needs a letter from dr Yetta Barrejones stating that patient is no longer able to live alone and that she needs 24/7 supervision. This is to get out of her current lease. She has been scheduled for Monday 10/03/2014.   Patient was also seen over the weekend. Dr. Fabian SharpPanosh took her off of her alzheimer's meds . Requesting advise as to what to do now?

## 2014-09-27 ENCOUNTER — Encounter: Payer: Self-pay | Admitting: Internal Medicine

## 2014-09-27 NOTE — Telephone Encounter (Signed)
Stop the meds for alzheimers

## 2014-09-28 ENCOUNTER — Telehealth: Payer: Self-pay | Admitting: Internal Medicine

## 2014-09-28 NOTE — Telephone Encounter (Signed)
[facility] Chapelllen called and verbal okay for PT evaluation.

## 2014-09-28 NOTE — Telephone Encounter (Signed)
Is PT eval okay?

## 2014-09-28 NOTE — Telephone Encounter (Signed)
yes

## 2014-09-28 NOTE — Telephone Encounter (Signed)
[facility] called and want Verbal for PT evaluation.    Alvino Chapelllen [phone]

## 2014-10-03 ENCOUNTER — Ambulatory Visit (INDEPENDENT_AMBULATORY_CARE_PROVIDER_SITE_OTHER): Payer: Medicare Other | Admitting: Internal Medicine

## 2014-10-03 ENCOUNTER — Other Ambulatory Visit (INDEPENDENT_AMBULATORY_CARE_PROVIDER_SITE_OTHER): Payer: Medicare Other

## 2014-10-03 VITALS — BP 130/68 | HR 63 | Temp 98.6°F | Resp 14 | Ht 65.0 in | Wt 119.0 lb

## 2014-10-03 DIAGNOSIS — F03918 Unspecified dementia, unspecified severity, with other behavioral disturbance: Secondary | ICD-10-CM

## 2014-10-03 DIAGNOSIS — I10 Essential (primary) hypertension: Secondary | ICD-10-CM

## 2014-10-03 DIAGNOSIS — K219 Gastro-esophageal reflux disease without esophagitis: Secondary | ICD-10-CM

## 2014-10-03 DIAGNOSIS — Z23 Encounter for immunization: Secondary | ICD-10-CM | POA: Diagnosis not present

## 2014-10-03 DIAGNOSIS — F0391 Unspecified dementia with behavioral disturbance: Secondary | ICD-10-CM | POA: Diagnosis not present

## 2014-10-03 DIAGNOSIS — E785 Hyperlipidemia, unspecified: Secondary | ICD-10-CM

## 2014-10-03 DIAGNOSIS — Z0000 Encounter for general adult medical examination without abnormal findings: Secondary | ICD-10-CM

## 2014-10-03 DIAGNOSIS — I2510 Atherosclerotic heart disease of native coronary artery without angina pectoris: Secondary | ICD-10-CM

## 2014-10-03 DIAGNOSIS — E559 Vitamin D deficiency, unspecified: Secondary | ICD-10-CM

## 2014-10-03 LAB — LIPID PANEL
CHOL/HDL RATIO: 3
CHOLESTEROL: 179 mg/dL (ref 0–200)
HDL: 60.8 mg/dL (ref >39.00)
LDL CALC: 102 mg/dL — AB (ref 0–99)
NONHDL: 118.2
Triglycerides: 80 mg/dL (ref 0.0–149.0)
VLDL: 16 mg/dL (ref 0.0–40.0)

## 2014-10-03 LAB — COMPREHENSIVE METABOLIC PANEL
ALBUMIN: 3.7 g/dL (ref 3.5–5.2)
ALT: 17 U/L (ref 0–35)
AST: 26 U/L (ref 0–37)
Alkaline Phosphatase: 56 U/L (ref 39–117)
BILIRUBIN TOTAL: 0.8 mg/dL (ref 0.2–1.2)
BUN: 22 mg/dL (ref 6–23)
CALCIUM: 9.6 mg/dL (ref 8.4–10.5)
CHLORIDE: 105 meq/L (ref 96–112)
CO2: 34 meq/L — AB (ref 19–32)
Creatinine, Ser: 0.89 mg/dL (ref 0.40–1.20)
GFR: 77.54 mL/min (ref >60.00)
Glucose, Bld: 90 mg/dL (ref 70–99)
POTASSIUM: 4 meq/L (ref 3.5–5.1)
Sodium: 140 mEq/L (ref 135–145)
TOTAL PROTEIN: 6.9 g/dL (ref 6.0–8.3)

## 2014-10-03 LAB — CBC WITH DIFFERENTIAL/PLATELET
BASOS PCT: 0.4 % (ref 0.0–3.0)
Basophils Absolute: 0 10*3/uL (ref 0.0–0.1)
EOS PCT: 1 % (ref 0.0–5.0)
Eosinophils Absolute: 0.1 10*3/uL (ref 0.0–0.7)
HEMATOCRIT: 40.5 % (ref 36.0–46.0)
Hemoglobin: 13.1 g/dL (ref 12.0–15.0)
LYMPHS ABS: 1.4 10*3/uL (ref 0.7–4.0)
Lymphocytes Relative: 22.5 % (ref 12.0–46.0)
MCHC: 32.4 g/dL (ref 30.0–36.0)
MCV: 89.1 fl (ref 78.0–100.0)
MONO ABS: 0.8 10*3/uL (ref 0.1–1.0)
Monocytes Relative: 13.7 % — ABNORMAL HIGH (ref 3.0–12.0)
Neutro Abs: 3.9 10*3/uL (ref 1.4–7.7)
Neutrophils Relative %: 62.4 % (ref 43.0–77.0)
PLATELETS: 341 10*3/uL (ref 150.0–400.0)
RBC: 4.54 Mil/uL (ref 3.87–5.11)
RDW: 15.5 % (ref 11.5–15.5)
WBC: 6.2 10*3/uL (ref 4.0–10.5)

## 2014-10-03 LAB — TSH: TSH: 1.02 u[IU]/mL (ref 0.35–4.50)

## 2014-10-03 NOTE — Patient Instructions (Signed)
Preventive Care for Adults A healthy lifestyle and preventive care can promote health and wellness. Preventive health guidelines for women include the following key practices.  A routine yearly physical is a good way to check with your health care provider about your health and preventive screening. It is a chance to share any concerns and updates on your health and to receive a thorough exam.  Visit your dentist for a routine exam and preventive care every 6 months. Brush your teeth twice a day and floss once a day. Good oral hygiene prevents tooth decay and gum disease.  The frequency of eye exams is based on your age, health, family medical history, use of contact lenses, and other factors. Follow your health care provider's recommendations for frequency of eye exams.  Eat a healthy diet. Foods like vegetables, fruits, whole grains, low-fat dairy products, and lean protein foods contain the nutrients you need without too many calories. Decrease your intake of foods high in solid fats, added sugars, and salt. Eat the right amount of calories for you.Get information about a proper diet from your health care provider, if necessary.  Regular physical exercise is one of the most important things you can do for your health. Most adults should get at least 150 minutes of moderate-intensity exercise (any activity that increases your heart rate and causes you to sweat) each week. In addition, most adults need muscle-strengthening exercises on 2 or more days a week.  Maintain a healthy weight. The body mass index (BMI) is a screening tool to identify possible weight problems. It provides an estimate of body fat based on height and weight. Your health care provider can find your BMI and can help you achieve or maintain a healthy weight.For adults 20 years and older:  A BMI below 18.5 is considered underweight.  A BMI of 18.5 to 24.9 is normal.  A BMI of 25 to 29.9 is considered overweight.  A BMI of  30 and above is considered obese.  Maintain normal blood lipids and cholesterol levels by exercising and minimizing your intake of saturated fat. Eat a balanced diet with plenty of fruit and vegetables. Blood tests for lipids and cholesterol should begin at age 76 and be repeated every 5 years. If your lipid or cholesterol levels are high, you are over 50, or you are at high risk for heart disease, you may need your cholesterol levels checked more frequently.Ongoing high lipid and cholesterol levels should be treated with medicines if diet and exercise are not working.  If you smoke, find out from your health care provider how to quit. If you do not use tobacco, do not start.  Lung cancer screening is recommended for adults aged 22-80 years who are at high risk for developing lung cancer because of a history of smoking. A yearly low-dose CT scan of the lungs is recommended for people who have at least a 30-pack-year history of smoking and are a current smoker or have quit within the past 15 years. A pack year of smoking is smoking an average of 1 pack of cigarettes a day for 1 year (for example: 1 pack a day for 30 years or 2 packs a day for 15 years). Yearly screening should continue until the smoker has stopped smoking for at least 15 years. Yearly screening should be stopped for people who develop a health problem that would prevent them from having lung cancer treatment.  If you are pregnant, do not drink alcohol. If you are breastfeeding,  be very cautious about drinking alcohol. If you are not pregnant and choose to drink alcohol, do not have more than 1 drink per day. One drink is considered to be 12 ounces (355 mL) of beer, 5 ounces (148 mL) of wine, or 1.5 ounces (44 mL) of liquor.  Avoid use of street drugs. Do not share needles with anyone. Ask for help if you need support or instructions about stopping the use of drugs.  High blood pressure causes heart disease and increases the risk of  stroke. Your blood pressure should be checked at least every 1 to 2 years. Ongoing high blood pressure should be treated with medicines if weight loss and exercise do not work.  If you are 75-52 years old, ask your health care provider if you should take aspirin to prevent strokes.  Diabetes screening involves taking a blood sample to check your fasting blood sugar level. This should be done once every 3 years, after age 15, if you are within normal weight and without risk factors for diabetes. Testing should be considered at a younger age or be carried out more frequently if you are overweight and have at least 1 risk factor for diabetes.  Breast cancer screening is essential preventive care for women. You should practice "breast self-awareness." This means understanding the normal appearance and feel of your breasts and may include breast self-examination. Any changes detected, no matter how small, should be reported to a health care provider. Women in their 58s and 30s should have a clinical breast exam (CBE) by a health care provider as part of a regular health exam every 1 to 3 years. After age 16, women should have a CBE every year. Starting at age 53, women should consider having a mammogram (breast X-ray test) every year. Women who have a family history of breast cancer should talk to their health care provider about genetic screening. Women at a high risk of breast cancer should talk to their health care providers about having an MRI and a mammogram every year.  Breast cancer gene (BRCA)-related cancer risk assessment is recommended for women who have family members with BRCA-related cancers. BRCA-related cancers include breast, ovarian, tubal, and peritoneal cancers. Having family members with these cancers may be associated with an increased risk for harmful changes (mutations) in the breast cancer genes BRCA1 and BRCA2. Results of the assessment will determine the need for genetic counseling and  BRCA1 and BRCA2 testing.  Routine pelvic exams to screen for cancer are no longer recommended for nonpregnant women who are considered low risk for cancer of the pelvic organs (ovaries, uterus, and vagina) and who do not have symptoms. Ask your health care provider if a screening pelvic exam is right for you.  If you have had past treatment for cervical cancer or a condition that could lead to cancer, you need Pap tests and screening for cancer for at least 20 years after your treatment. If Pap tests have been discontinued, your risk factors (such as having a new sexual partner) need to be reassessed to determine if screening should be resumed. Some women have medical problems that increase the chance of getting cervical cancer. In these cases, your health care provider may recommend more frequent screening and Pap tests.  The HPV test is an additional test that may be used for cervical cancer screening. The HPV test looks for the virus that can cause the cell changes on the cervix. The cells collected during the Pap test can be  tested for HPV. The HPV test could be used to screen women aged 30 years and older, and should be used in women of any age who have unclear Pap test results. After the age of 30, women should have HPV testing at the same frequency as a Pap test.  Colorectal cancer can be detected and often prevented. Most routine colorectal cancer screening begins at the age of 50 years and continues through age 75 years. However, your health care provider may recommend screening at an earlier age if you have risk factors for colon cancer. On a yearly basis, your health care provider may provide home test kits to check for hidden blood in the stool. Use of a small camera at the end of a tube, to directly examine the colon (sigmoidoscopy or colonoscopy), can detect the earliest forms of colorectal cancer. Talk to your health care provider about this at age 50, when routine screening begins. Direct  exam of the colon should be repeated every 5-10 years through age 75 years, unless early forms of pre-cancerous polyps or small growths are found.  People who are at an increased risk for hepatitis B should be screened for this virus. You are considered at high risk for hepatitis B if:  You were born in a country where hepatitis B occurs often. Talk with your health care provider about which countries are considered high risk.  Your parents were born in a high-risk country and you have not received a shot to protect against hepatitis B (hepatitis B vaccine).  You have HIV or AIDS.  You use needles to inject street drugs.  You live with, or have sex with, someone who has hepatitis B.  You get hemodialysis treatment.  You take certain medicines for conditions like cancer, organ transplantation, and autoimmune conditions.  Hepatitis C blood testing is recommended for all people born from 1945 through 1965 and any individual with known risks for hepatitis C.  Practice safe sex. Use condoms and avoid high-risk sexual practices to reduce the spread of sexually transmitted infections (STIs). STIs include gonorrhea, chlamydia, syphilis, trichomonas, herpes, HPV, and human immunodeficiency virus (HIV). Herpes, HIV, and HPV are viral illnesses that have no cure. They can result in disability, cancer, and death.  You should be screened for sexually transmitted illnesses (STIs) including gonorrhea and chlamydia if:  You are sexually active and are younger than 24 years.  You are older than 24 years and your health care provider tells you that you are at risk for this type of infection.  Your sexual activity has changed since you were last screened and you are at an increased risk for chlamydia or gonorrhea. Ask your health care provider if you are at risk.  If you are at risk of being infected with HIV, it is recommended that you take a prescription medicine daily to prevent HIV infection. This is  called preexposure prophylaxis (PrEP). You are considered at risk if:  You are a heterosexual woman, are sexually active, and are at increased risk for HIV infection.  You take drugs by injection.  You are sexually active with a partner who has HIV.  Talk with your health care provider about whether you are at high risk of being infected with HIV. If you choose to begin PrEP, you should first be tested for HIV. You should then be tested every 3 months for as long as you are taking PrEP.  Osteoporosis is a disease in which the bones lose minerals and strength   with aging. This can result in serious bone fractures or breaks. The risk of osteoporosis can be identified using a bone density scan. Women ages 65 years and over and women at risk for fractures or osteoporosis should discuss screening with their health care providers. Ask your health care provider whether you should take a calcium supplement or vitamin D to reduce the rate of osteoporosis.  Menopause can be associated with physical symptoms and risks. Hormone replacement therapy is available to decrease symptoms and risks. You should talk to your health care provider about whether hormone replacement therapy is right for you.  Use sunscreen. Apply sunscreen liberally and repeatedly throughout the day. You should seek shade when your shadow is shorter than you. Protect yourself by wearing long sleeves, pants, a wide-brimmed hat, and sunglasses year round, whenever you are outdoors.  Once a month, do a whole body skin exam, using a mirror to look at the skin on your back. Tell your health care provider of new moles, moles that have irregular borders, moles that are larger than a pencil eraser, or moles that have changed in shape or color.  Stay current with required vaccines (immunizations).  Influenza vaccine. All adults should be immunized every year.  Tetanus, diphtheria, and acellular pertussis (Td, Tdap) vaccine. Pregnant women should  receive 1 dose of Tdap vaccine during each pregnancy. The dose should be obtained regardless of the length of time since the last dose. Immunization is preferred during the 27th-36th week of gestation. An adult who has not previously received Tdap or who does not know her vaccine status should receive 1 dose of Tdap. This initial dose should be followed by tetanus and diphtheria toxoids (Td) booster doses every 10 years. Adults with an unknown or incomplete history of completing a 3-dose immunization series with Td-containing vaccines should begin or complete a primary immunization series including a Tdap dose. Adults should receive a Td booster every 10 years.  Varicella vaccine. An adult without evidence of immunity to varicella should receive 2 doses or a second dose if she has previously received 1 dose. Pregnant females who do not have evidence of immunity should receive the first dose after pregnancy. This first dose should be obtained before leaving the health care facility. The second dose should be obtained 4-8 weeks after the first dose.  Human papillomavirus (HPV) vaccine. Females aged 13-26 years who have not received the vaccine previously should obtain the 3-dose series. The vaccine is not recommended for use in pregnant females. However, pregnancy testing is not needed before receiving a dose. If a female is found to be pregnant after receiving a dose, no treatment is needed. In that case, the remaining doses should be delayed until after the pregnancy. Immunization is recommended for any person with an immunocompromised condition through the age of 26 years if she did not get any or all doses earlier. During the 3-dose series, the second dose should be obtained 4-8 weeks after the first dose. The third dose should be obtained 24 weeks after the first dose and 16 weeks after the second dose.  Zoster vaccine. One dose is recommended for adults aged 60 years or older unless certain conditions are  present.  Measles, mumps, and rubella (MMR) vaccine. Adults born before 1957 generally are considered immune to measles and mumps. Adults born in 1957 or later should have 1 or more doses of MMR vaccine unless there is a contraindication to the vaccine or there is laboratory evidence of immunity to   each of the three diseases. A routine second dose of MMR vaccine should be obtained at least 28 days after the first dose for students attending postsecondary schools, health care workers, or international travelers. People who received inactivated measles vaccine or an unknown type of measles vaccine during 1963-1967 should receive 2 doses of MMR vaccine. People who received inactivated mumps vaccine or an unknown type of mumps vaccine before 1979 and are at high risk for mumps infection should consider immunization with 2 doses of MMR vaccine. For females of childbearing age, rubella immunity should be determined. If there is no evidence of immunity, females who are not pregnant should be vaccinated. If there is no evidence of immunity, females who are pregnant should delay immunization until after pregnancy. Unvaccinated health care workers born before 35 who lack laboratory evidence of measles, mumps, or rubella immunity or laboratory confirmation of disease should consider measles and mumps immunization with 2 doses of MMR vaccine or rubella immunization with 1 dose of MMR vaccine.  Pneumococcal 13-valent conjugate (PCV13) vaccine. When indicated, a person who is uncertain of her immunization history and has no record of immunization should receive the PCV13 vaccine. An adult aged 46 years or older who has certain medical conditions and has not been previously immunized should receive 1 dose of PCV13 vaccine. This PCV13 should be followed with a dose of pneumococcal polysaccharide (PPSV23) vaccine. The PPSV23 vaccine dose should be obtained at least 8 weeks after the dose of PCV13 vaccine. An adult aged 21  years or older who has certain medical conditions and previously received 1 or more doses of PPSV23 vaccine should receive 1 dose of PCV13. The PCV13 vaccine dose should be obtained 1 or more years after the last PPSV23 vaccine dose.  Pneumococcal polysaccharide (PPSV23) vaccine. When PCV13 is also indicated, PCV13 should be obtained first. All adults aged 69 years and older should be immunized. An adult younger than age 100 years who has certain medical conditions should be immunized. Any person who resides in a nursing home or long-term care facility should be immunized. An adult smoker should be immunized. People with an immunocompromised condition and certain other conditions should receive both PCV13 and PPSV23 vaccines. People with human immunodeficiency virus (HIV) infection should be immunized as soon as possible after diagnosis. Immunization during chemotherapy or radiation therapy should be avoided. Routine use of PPSV23 vaccine is not recommended for American Indians, [place] Natives, or people younger than 65 years unless there are medical conditions that require PPSV23 vaccine. When indicated, people who have unknown immunization and have no record of immunization should receive PPSV23 vaccine. One-time revaccination 5 years after the first dose of PPSV23 is recommended for people aged 19-64 years who have chronic kidney failure, nephrotic syndrome, asplenia, or immunocompromised conditions. People who received 1-2 doses of PPSV23 before age 15 years should receive another dose of PPSV23 vaccine at age 91 years or later if at least 5 years have passed since the previous dose. Doses of PPSV23 are not needed for people immunized with PPSV23 at or after age 68 years.  Meningococcal vaccine. Adults with asplenia or persistent complement component deficiencies should receive 2 doses of quadrivalent meningococcal conjugate (MenACWY-D) vaccine. The doses should be obtained at least 2 months apart.  Microbiologists working with certain meningococcal bacteria, [employer] recruits, people at risk during an outbreak, and people who travel to or live in countries with a high rate of meningitis should be immunized. A first-year college student up through age  21 years who is living in a residence hall should receive a dose if she did not receive a dose on or after her 16th birthday. Adults who have certain high-risk conditions should receive one or more doses of vaccine.  Hepatitis A vaccine. Adults who wish to be protected from this disease, have certain high-risk conditions, work with hepatitis A-infected animals, work in hepatitis A research labs, or travel to or work in countries with a high rate of hepatitis A should be immunized. Adults who were previously unvaccinated and who anticipate close contact with an international adoptee during the first 60 days after arrival in the [place] from a country with a high rate of hepatitis A should be immunized.  Hepatitis B vaccine. Adults who wish to be protected from this disease, have certain high-risk conditions, may be exposed to blood or other infectious body fluids, are household contacts or sex partners of hepatitis B positive people, are clients or workers in certain care facilities, or travel to or work in countries with a high rate of hepatitis B should be immunized.  Haemophilus influenzae type b (Hib) vaccine. A previously unvaccinated person with asplenia or sickle cell disease or having a scheduled splenectomy should receive 1 dose of Hib vaccine. Regardless of previous immunization, a recipient of a hematopoietic stem cell transplant should receive a 3-dose series 6-12 months after her successful transplant. Hib vaccine is not recommended for adults with HIV infection. Preventive Services / Frequency Ages 68 to 49 years  Blood pressure check.** / Every 1 to 2 years.  Lipid and cholesterol check.** / Every 5 years beginning at age  21.  Clinical breast exam.** / Every 3 years for women in their 6s and 22s.  BRCA-related cancer risk assessment.** / For women who have family members with a BRCA-related cancer (breast, ovarian, tubal, or peritoneal cancers).  Pap test.** / Every 2 years from ages 37 through 55. Every 3 years starting at age 57 through age 39 or 88 with a history of 3 consecutive normal Pap tests.  HPV screening.** / Every 3 years from ages 8 through ages 62 to 57 with a history of 3 consecutive normal Pap tests.  Hepatitis C blood test.** / For any individual with known risks for hepatitis C.  Skin self-exam. / Monthly.  Influenza vaccine. / Every year.  Tetanus, diphtheria, and acellular pertussis (Tdap, Td) vaccine.** / Consult your health care provider. Pregnant women should receive 1 dose of Tdap vaccine during each pregnancy. 1 dose of Td every 10 years.  Varicella vaccine.** / Consult your health care provider. Pregnant females who do not have evidence of immunity should receive the first dose after pregnancy.  HPV vaccine. / 3 doses over 6 months, if 68 and younger. The vaccine is not recommended for use in pregnant females. However, pregnancy testing is not needed before receiving a dose.  Measles, mumps, rubella (MMR) vaccine.** / You need at least 1 dose of MMR if you were born in 1957 or later. You may also need a 2nd dose. For females of childbearing age, rubella immunity should be determined. If there is no evidence of immunity, females who are not pregnant should be vaccinated. If there is no evidence of immunity, females who are pregnant should delay immunization until after pregnancy.  Pneumococcal 13-valent conjugate (PCV13) vaccine.** / Consult your health care provider.  Pneumococcal polysaccharide (PPSV23) vaccine.** / 1 to 2 doses if you smoke cigarettes or if you have certain conditions.  Meningococcal vaccine.** /  1 dose if you are age 29 to 75 years and a [place] living in a residence hall, or have one of several medical conditions, you need to get vaccinated against meningococcal disease. You may also need additional booster doses.  Hepatitis A vaccine.** / Consult your health care provider.  Hepatitis B vaccine.** / Consult your health care provider.  Haemophilus influenzae type b (Hib) vaccine.** / Consult your health care provider. Ages 52 to 98 years  Blood pressure check.** / Every 1 to 2 years.  Lipid and cholesterol check.** / Every 5 years beginning at age 6 years.  Lung cancer screening. / Every year if you are aged 21-80 years and have a 30-pack-year history of smoking and currently smoke or have quit within the past 15 years. Yearly screening is stopped once you have quit smoking for at least 15 years or develop a health problem that would prevent you from having lung cancer treatment.  Clinical breast exam.** / Every year after age 7 years.  BRCA-related cancer risk assessment.** / For women who have family members with a BRCA-related cancer (breast, ovarian, tubal, or peritoneal cancers).  Mammogram.** / Every year beginning at age 46 years and continuing for as long as you are in good health. Consult with your health care provider.  Pap test.** / Every 3 years starting at age 57 years through age 15 or 64 years with a history of 3 consecutive normal Pap tests.  HPV screening.** / Every 3 years from ages 85 years through ages 12 to 38 years with a history of 3 consecutive normal Pap tests.  Fecal occult blood test (FOBT) of stool. / Every year beginning at age 32 years and continuing until age 62 years. You may not need to do this test if you get a colonoscopy every 10 years.  Flexible sigmoidoscopy or colonoscopy.** / Every 5 years for a flexible sigmoidoscopy or every 10 years for a colonoscopy beginning at age 50 years and continuing until age 81 years.  Hepatitis C blood test.** / For all people born from [birth year] through  1963 and any individual with known risks for hepatitis C.  Skin self-exam. / Monthly.  Influenza vaccine. / Every year.  Tetanus, diphtheria, and acellular pertussis (Tdap/Td) vaccine.** / Consult your health care provider. Pregnant women should receive 1 dose of Tdap vaccine during each pregnancy. 1 dose of Td every 10 years.  Varicella vaccine.** / Consult your health care provider. Pregnant females who do not have evidence of immunity should receive the first dose after pregnancy.  Zoster vaccine.** / 1 dose for adults aged 50 years or older.  Measles, mumps, rubella (MMR) vaccine.** / You need at least 1 dose of MMR if you were born in 1957 or later. You may also need a 2nd dose. For females of childbearing age, rubella immunity should be determined. If there is no evidence of immunity, females who are not pregnant should be vaccinated. If there is no evidence of immunity, females who are pregnant should delay immunization until after pregnancy.  Pneumococcal 13-valent conjugate (PCV13) vaccine.** / Consult your health care provider.  Pneumococcal polysaccharide (PPSV23) vaccine.** / 1 to 2 doses if you smoke cigarettes or if you have certain conditions.  Meningococcal vaccine.** / Consult your health care provider.  Hepatitis A vaccine.** / Consult your health care provider.  Hepatitis B vaccine.** / Consult your health care provider.  Haemophilus influenzae type b (Hib) vaccine.** / Consult your health care provider. Ages 52  years and over  Blood pressure check.** / Every 1 to 2 years.  Lipid and cholesterol check.** / Every 5 years beginning at age 22 years.  Lung cancer screening. / Every year if you are aged 73-80 years and have a 30-pack-year history of smoking and currently smoke or have quit within the past 15 years. Yearly screening is stopped once you have quit smoking for at least 15 years or develop a health problem that would prevent you from having lung cancer  treatment.  Clinical breast exam.** / Every year after age 4 years.  BRCA-related cancer risk assessment.** / For women who have family members with a BRCA-related cancer (breast, ovarian, tubal, or peritoneal cancers).  Mammogram.** / Every year beginning at age 40 years and continuing for as long as you are in good health. Consult with your health care provider.  Pap test.** / Every 3 years starting at age 9 years through age 34 or 91 years with 3 consecutive normal Pap tests. Testing can be stopped between 65 and 70 years with 3 consecutive normal Pap tests and no abnormal Pap or HPV tests in the past 10 years.  HPV screening.** / Every 3 years from ages 57 years through ages 64 or 45 years with a history of 3 consecutive normal Pap tests. Testing can be stopped between 65 and 70 years with 3 consecutive normal Pap tests and no abnormal Pap or HPV tests in the past 10 years.  Fecal occult blood test (FOBT) of stool. / Every year beginning at age 15 years and continuing until age 17 years. You may not need to do this test if you get a colonoscopy every 10 years.  Flexible sigmoidoscopy or colonoscopy.** / Every 5 years for a flexible sigmoidoscopy or every 10 years for a colonoscopy beginning at age 86 years and continuing until age 71 years.  Hepatitis C blood test.** / For all people born from [birth year] through 1963 and any individual with known risks for hepatitis C.  Osteoporosis screening.** / A one-time screening for women ages 83 years and over and women at risk for fractures or osteoporosis.  Skin self-exam. / Monthly.  Influenza vaccine. / Every year.  Tetanus, diphtheria, and acellular pertussis (Tdap/Td) vaccine.** / 1 dose of Td every 10 years.  Varicella vaccine.** / Consult your health care provider.  Zoster vaccine.** / 1 dose for adults aged 61 years or older.  Pneumococcal 13-valent conjugate (PCV13) vaccine.** / Consult your health care provider.  Pneumococcal  polysaccharide (PPSV23) vaccine.** / 1 dose for all adults aged 28 years and older.  Meningococcal vaccine.** / Consult your health care provider.  Hepatitis A vaccine.** / Consult your health care provider.  Hepatitis B vaccine.** / Consult your health care provider.  Haemophilus influenzae type b (Hib) vaccine.** / Consult your health care provider. ** Family history and personal history of risk and conditions may change your health care provider's recommendations. Document Released: 09/17/2001 Document Revised: 12/06/2013 Document Reviewed: 12/17/2010 [facility] Patient Information 2015 [place], [place]. This information is not intended to replace advice given to you by your health care provider. Make sure you discuss any questions you have with your health care provider.

## 2014-10-03 NOTE — Progress Notes (Signed)
Subjective:    Patient ID: [name], female    DOB: 1928-02-20, 81 y.o.   MRN: [id number]  Hypertension This is a chronic problem. The current episode started more than 1 year ago. The problem has been rapidly improving since onset. The problem is controlled. Associated symptoms include malaise/fatigue. Pertinent negatives include no anxiety, blurred vision, chest pain, headaches, neck pain, orthopnea, palpitations, peripheral edema, PND, shortness of breath or sweats. There are no associated agents to hypertension. Past treatments include beta blockers, calcium channel blockers, angiotensin blockers and diuretics. The current treatment provides significant improvement. There are no compliance problems.       Review of Systems  Constitutional: Positive for malaise/fatigue. Negative for fever, chills, diaphoresis, appetite change and fatigue.  HENT: Negative.   Eyes: Negative.  Negative for blurred vision.  Respiratory: Negative.  Negative for cough, choking, chest tightness, shortness of breath and stridor.   Cardiovascular: Negative.  Negative for chest pain, palpitations, orthopnea, leg swelling and PND.  Gastrointestinal: Negative.  Negative for nausea, vomiting, abdominal pain, diarrhea, constipation and blood in stool.  Endocrine: Negative.   Genitourinary: Negative.   Musculoskeletal: Negative.  Negative for myalgias, back pain, arthralgias and neck pain.  Skin: Negative.  Negative for rash.  Allergic/Immunologic: Negative.   Neurological: Positive for dizziness and light-headedness. Negative for tremors, seizures, syncope, facial asymmetry, speech difficulty, weakness, numbness and headaches.  Hematological: Negative.  Negative for adenopathy. Does not bruise/bleed easily.  Psychiatric/Behavioral: Positive for confusion and decreased concentration. Negative for suicidal ideas, hallucinations, behavioral problems, sleep disturbance, self-injury, dysphoric mood and agitation.  The patient is not nervous/anxious and is not hyperactive.        Objective:   Physical Exam  Constitutional: She is oriented to person, place, and time. She appears well-developed and well-nourished. No distress.  HENT:  Head: Normocephalic and atraumatic.  Mouth/Throat: Oropharynx is clear and moist. No oropharyngeal exudate.  Eyes: Conjunctivae are normal. Right eye exhibits no discharge. Left eye exhibits no discharge. No scleral icterus.  Neck: Normal range of motion. Neck supple. No JVD present. No tracheal deviation present. No thyromegaly present.  Cardiovascular: Normal rate, regular rhythm, normal heart sounds and intact distal pulses.  Exam reveals no gallop and no friction rub.   No murmur heard. Pulmonary/Chest: Effort normal and breath sounds normal. No stridor. No respiratory distress. She has no wheezes. She has no rales. She exhibits no tenderness.  Abdominal: Soft. Bowel sounds are normal. She exhibits no distension and no mass. There is no tenderness. There is no rebound and no guarding.  Genitourinary: No breast swelling, tenderness, discharge or bleeding. Pelvic exam was performed with patient supine.  GU and rectal exams were deferred at her request  Musculoskeletal: Normal range of motion. She exhibits no edema or tenderness.  Lymphadenopathy:    She has no cervical adenopathy.  Neurological: She is oriented to person, place, and time.  Skin: Skin is warm and dry. No rash noted. She is not diaphoretic. No erythema. No pallor.  Psychiatric: She has a normal mood and affect. Her behavior is normal. Judgment and thought content normal.  Vitals reviewed.    Lab Results  Component Value Date   WBC 6.9 12/09/2013   HGB 14.3 12/09/2013   HCT 44.0 12/09/2013   PLT 317.0 12/09/2013   GLUCOSE 107* 06/13/2014   CHOL 211* 12/09/2013   TRIG 74.0 12/09/2013   HDL 63.30 12/09/2013   LDLDIRECT 140.3 11/11/2011   LDLCALC 133* 12/09/2013   ALT 12  12/09/2013   AST 23  12/09/2013   NA 142 06/13/2014   K 4.1 06/13/2014   CL 105 06/13/2014   CREATININE 1.0 06/13/2014   BUN 17 06/13/2014   CO2 30 06/13/2014   TSH 0.97 12/09/2013   INR 1.1 02/09/2008       Assessment & Plan:

## 2014-10-04 ENCOUNTER — Encounter: Payer: Self-pay | Admitting: Internal Medicine

## 2014-10-04 NOTE — Assessment & Plan Note (Signed)
She was not bale to tolerate namenda and aricept No further treatment offered for this

## 2014-10-04 NOTE — Assessment & Plan Note (Signed)
She is doing well on lipitor Will recheck her FLP and will address if indicated

## 2014-10-04 NOTE — Assessment & Plan Note (Signed)
She complains of dizziness and lightheadedness and her BP has been down to 123/77 at home Will stop the ARB/HCTZ but will cont the Beta-blocker and CCB Will monitor her lytes and renal function today

## 2014-10-04 NOTE — Assessment & Plan Note (Signed)

## 2014-10-10 ENCOUNTER — Telehealth: Payer: Self-pay | Admitting: Internal Medicine

## 2014-10-10 NOTE — Telephone Encounter (Signed)
Pt had a fall this morning, no injuries  [name] with [name] called to report fall

## 2014-11-14 ENCOUNTER — Ambulatory Visit (INDEPENDENT_AMBULATORY_CARE_PROVIDER_SITE_OTHER)
Admission: RE | Admit: 2014-11-14 | Discharge: 2014-11-14 | Disposition: A | Discharge status: Home / Self Care | Payer: Medicare Other | Source: Ambulatory Visit | Attending: Internal Medicine | Admitting: Internal Medicine

## 2014-11-14 ENCOUNTER — Other Ambulatory Visit (INDEPENDENT_AMBULATORY_CARE_PROVIDER_SITE_OTHER): Payer: Medicare Other

## 2014-11-14 ENCOUNTER — Encounter: Payer: Self-pay | Admitting: Internal Medicine

## 2014-11-14 ENCOUNTER — Ambulatory Visit (INDEPENDENT_AMBULATORY_CARE_PROVIDER_SITE_OTHER): Payer: Medicare Other | Admitting: Internal Medicine

## 2014-11-14 VITALS — BP 150/80 | HR 77 | Temp 98.5°F | Resp 16 | Ht 65.0 in | Wt 121.0 lb

## 2014-11-14 DIAGNOSIS — I10 Essential (primary) hypertension: Secondary | ICD-10-CM

## 2014-11-14 DIAGNOSIS — M5442 Lumbago with sciatica, left side: Secondary | ICD-10-CM

## 2014-11-14 LAB — CBC WITH DIFFERENTIAL/PLATELET
BASOS PCT: 0.3 % (ref 0.0–3.0)
Basophils Absolute: 0 10*3/uL (ref 0.0–0.1)
EOS PCT: 0.5 % (ref 0.0–5.0)
Eosinophils Absolute: 0 10*3/uL (ref 0.0–0.7)
HEMATOCRIT: 40.5 % (ref 36.0–46.0)
HEMOGLOBIN: 13.4 g/dL (ref 12.0–15.0)
LYMPHS ABS: 1 10*3/uL (ref 0.7–4.0)
LYMPHS PCT: 17.2 % (ref 12.0–46.0)
MCHC: 33.2 g/dL (ref 30.0–36.0)
MCV: 88.1 fl (ref 78.0–100.0)
MONOS PCT: 7.9 % (ref 3.0–12.0)
Monocytes Absolute: 0.5 10*3/uL (ref 0.1–1.0)
NEUTROS ABS: 4.4 10*3/uL (ref 1.4–7.7)
Neutrophils Relative %: 74.1 % (ref 43.0–77.0)
Platelets: 334 10*3/uL (ref 150.0–400.0)
RBC: 4.6 Mil/uL (ref 3.87–5.11)
RDW: 15.1 % (ref 11.5–15.5)
WBC: 5.9 10*3/uL (ref 4.0–10.5)

## 2014-11-14 LAB — URINALYSIS, ROUTINE W REFLEX MICROSCOPIC
Bilirubin Urine: NEGATIVE
Hgb urine dipstick: NEGATIVE
KETONES UR: NEGATIVE
LEUKOCYTES UA: NEGATIVE
NITRITE: NEGATIVE
PH: 7 (ref 5.0–8.0)
RBC / HPF: NONE SEEN (ref 0–?)
Specific Gravity, Urine: 1.01 (ref 1.000–1.030)
Total Protein, Urine: NEGATIVE
UROBILINOGEN UA: 0.2 (ref 0.0–1.0)
Urine Glucose: NEGATIVE

## 2014-11-14 LAB — BASIC METABOLIC PANEL
BUN: 18 mg/dL (ref 6–23)
CALCIUM: 10.1 mg/dL (ref 8.4–10.5)
CHLORIDE: 104 meq/L (ref 96–112)
CO2: 33 mEq/L — ABNORMAL HIGH (ref 19–32)
CREATININE: 0.93 mg/dL (ref 0.40–1.20)
GFR: 73.68 mL/min (ref >60.00)
Glucose, Bld: 101 mg/dL — ABNORMAL HIGH (ref 70–99)
Potassium: 3.9 mEq/L (ref 3.5–5.1)
Sodium: 140 mEq/L (ref 135–145)

## 2014-11-14 MED ORDER — MELOXICAM 7.5 MG PO TABS: 7.5000 mg | ORAL_TABLET | Freq: Every day | ORAL | Status: DC

## 2014-11-14 NOTE — Patient Instructions (Signed)
Back Pain, Adult Low back pain is very common. About 1 in 5 people have back pain.The cause of low back pain is rarely dangerous. The pain often gets better over time.About half of people with a sudden onset of back pain feel better in just 2 weeks. About 8 in 10 people feel better by 6 weeks.  CAUSES Some common causes of back pain include:  Strain of the muscles or ligaments supporting the spine.  Wear and tear (degeneration) of the spinal discs.  Arthritis.  Direct injury to the back. DIAGNOSIS Most of the time, the direct cause of low back pain is not known.However, back pain can be treated effectively even when the exact cause of the pain is unknown.Answering your caregiver's questions about your overall health and symptoms is one of the most accurate ways to make sure the cause of your pain is not dangerous. If your caregiver needs more information, he or she may order lab work or imaging tests (X-rays or MRIs).However, even if imaging tests show changes in your back, this usually does not require surgery. HOME CARE INSTRUCTIONS For many people, back pain returns.Since low back pain is rarely dangerous, it is often a condition that people can learn to manageon their own.   Remain active. It is stressful on the back to sit or stand in one place. Do not sit, drive, or stand in one place for more than 30 minutes at a time. Take short walks on level surfaces as soon as pain allows.Try to increase the length of time you walk each day.  Do not stay in bed.Resting more than 1 or 2 days can delay your recovery.  Do not avoid exercise or work.Your body is made to move.It is not dangerous to be active, even though your back may hurt.Your back will likely heal faster if you return to being active before your pain is gone.  Pay attention to your body when you bend and lift. Many people have less discomfortwhen lifting if they bend their knees, keep the load close to their bodies,and  avoid twisting. Often, the most comfortable positions are those that put less stress on your recovering back.  Find a comfortable position to sleep. Use a firm mattress and lie on your side with your knees slightly bent. If you lie on your back, put a pillow under your knees.  Only take over-the-counter or prescription medicines as directed by your caregiver. Over-the-counter medicines to reduce pain and inflammation are often the most helpful.Your caregiver may prescribe muscle relaxant drugs.These medicines help dull your pain so you can more quickly return to your normal activities and healthy exercise.  Put ice on the injured area.  Put ice in a plastic bag.  Place a towel between your skin and the bag.  Leave the ice on for 15-20 minutes, 03-04 times a day for the first 2 to 3 days. After that, ice and heat may be alternated to reduce pain and spasms.  Ask your caregiver about trying back exercises and gentle massage. This may be of some benefit.  Avoid feeling anxious or stressed.Stress increases muscle tension and can worsen back pain.It is important to recognize when you are anxious or stressed and learn ways to manage it.Exercise is a great option. SEEK MEDICAL CARE IF:  You have pain that is not relieved with rest or medicine.  You have pain that does not improve in 1 week.  You have new symptoms.  You are generally not feeling well. SEEK   IMMEDIATE MEDICAL CARE IF:   You have pain that radiates from your back into your legs.  You develop new bowel or bladder control problems.  You have unusual weakness or numbness in your arms or legs.  You develop nausea or vomiting.  You develop abdominal pain.  You feel faint. Document Released: 07/22/2005 Document Revised: 01/21/2012 Document Reviewed: 11/23/2013 [employer] Patient Information 2015 [employer]. This information is not intended to replace advice given to you by your health care provider. Make sure you  discuss any questions you have with your health care provider.  

## 2014-11-14 NOTE — Progress Notes (Signed)
Pre visit review using our clinic review tool, if applicable. No additional management support is needed unless otherwise documented below in the visit note. 

## 2014-11-15 NOTE — Progress Notes (Signed)
Subjective:    Patient ID: [name], female    DOB: June 04, 1928, 81 y.o.   MRN: [id number]  Back Pain This is a chronic problem. The current episode started more than 1 year ago. The problem occurs intermittently. The problem is unchanged. The pain is present in the lumbar spine. The quality of the pain is described as aching. The pain radiates to the left thigh. The pain is at a severity of 3/10. The pain is mild. The symptoms are aggravated by bending, standing and position. Pertinent negatives include no abdominal pain, bladder incontinence, bowel incontinence, chest pain, dysuria, fever, headaches, leg pain, numbness, paresis, paresthesias, pelvic pain, perianal numbness, tingling, weakness or weight loss. She has tried analgesics for the symptoms.  Hypertension Pertinent negatives include no chest pain, headaches, palpitations or shortness of breath.      Review of Systems  Constitutional: Negative.  Negative for fever, chills, weight loss, diaphoresis, appetite change and fatigue.  HENT: Negative.   Eyes: Negative.   Respiratory: Negative.  Negative for cough, choking, chest tightness, shortness of breath and stridor.   Cardiovascular: Negative.  Negative for chest pain, palpitations and leg swelling.  Gastrointestinal: Negative.  Negative for nausea, vomiting, abdominal pain, diarrhea, constipation and bowel incontinence.  Endocrine: Negative.   Genitourinary: Negative.  Negative for bladder incontinence, dysuria and pelvic pain.  Musculoskeletal: Positive for back pain. Negative for myalgias, joint swelling, arthralgias and gait problem.  Skin: Negative.   Allergic/Immunologic: Negative.   Neurological: Negative.  Negative for dizziness, tingling, syncope, weakness, numbness, headaches and paresthesias.  Hematological: Negative.  Negative for adenopathy. Does not bruise/bleed easily.  Psychiatric/Behavioral: Negative.        Objective:   Physical Exam  Constitutional:  She is oriented to person, place, and time. She appears well-developed and well-nourished.  Non-toxic appearance. She does not have a sickly appearance. She does not appear ill. No distress.  HENT:  Head: Normocephalic and atraumatic.  Mouth/Throat: Oropharynx is clear and moist. No oropharyngeal exudate.  Eyes: Conjunctivae are normal. Right eye exhibits no discharge. Left eye exhibits no discharge. No scleral icterus.  Neck: Normal range of motion. Neck supple. No JVD present. No tracheal deviation present. No thyromegaly present.  Cardiovascular: Normal rate, regular rhythm, normal heart sounds and intact distal pulses.  Exam reveals no gallop and no friction rub.   No murmur heard. Pulmonary/Chest: Effort normal and breath sounds normal. No stridor. No respiratory distress. She has no wheezes. She has no rales. She exhibits no tenderness.  Abdominal: Soft. Bowel sounds are normal. She exhibits no distension and no mass. There is no tenderness. There is no rebound and no guarding.  Musculoskeletal: Normal range of motion. She exhibits no edema or tenderness.       Lumbar back: Normal. She exhibits normal range of motion, no tenderness, no bony tenderness, no swelling, no edema, no deformity, no laceration, no pain, no spasm and normal pulse.  Lymphadenopathy:    She has no cervical adenopathy.  Neurological: She is alert and oriented to person, place, and time. She has normal strength. She displays no atrophy, no tremor and normal reflexes. No cranial nerve deficit or sensory deficit. She exhibits normal muscle tone. She displays a negative Romberg sign. She displays no seizure activity. Coordination and gait normal.  Reflex Scores:      Tricep reflexes are 1+ on the right side and 1+ on the left side.      Bicep reflexes are 1+ on the right  side and 1+ on the left side.      Brachioradialis reflexes are 1+ on the right side and 1+ on the left side.      Patellar reflexes are 0 on the right  side and 0 on the left side.      Achilles reflexes are 0 on the right side and 0 on the left side. Neg SLR in BLE  Skin: Skin is warm and dry. No rash noted. She is not diaphoretic. No erythema. No pallor.  Vitals reviewed.    Lab Results  Component Value Date   WBC 5.9 11/14/2014   HGB 13.4 11/14/2014   HCT 40.5 11/14/2014   PLT 334.0 11/14/2014   GLUCOSE 101* 11/14/2014   CHOL 179 10/03/2014   TRIG 80.0 10/03/2014   HDL 60.80 10/03/2014   LDLDIRECT 140.3 11/11/2011   LDLCALC 102* 10/03/2014   ALT 17 10/03/2014   AST 26 10/03/2014   NA 140 11/14/2014   K 3.9 11/14/2014   CL 104 11/14/2014   CREATININE 0.93 11/14/2014   BUN 18 11/14/2014   CO2 33* 11/14/2014   TSH 1.02 10/03/2014   INR 1.1 02/09/2008       Assessment & Plan:

## 2014-11-15 NOTE — Assessment & Plan Note (Signed)
She does not have any dangerous s/s She will cont tylenol for pain and I will add mobic as well for pain relief

## 2014-11-15 NOTE — Assessment & Plan Note (Signed)
Her BP is well controlled 

## 2014-12-01 ENCOUNTER — Ambulatory Visit (INDEPENDENT_AMBULATORY_CARE_PROVIDER_SITE_OTHER): Payer: Medicare Other | Admitting: Internal Medicine

## 2014-12-01 ENCOUNTER — Other Ambulatory Visit (INDEPENDENT_AMBULATORY_CARE_PROVIDER_SITE_OTHER): Payer: Medicare Other

## 2014-12-01 DIAGNOSIS — K921 Melena: Secondary | ICD-10-CM | POA: Diagnosis not present

## 2014-12-01 DIAGNOSIS — I10 Essential (primary) hypertension: Secondary | ICD-10-CM | POA: Diagnosis not present

## 2014-12-01 LAB — CBC WITH DIFFERENTIAL/PLATELET
Basophils Absolute: 0 10*3/uL (ref 0.0–0.1)
Basophils Relative: 0.3 % (ref 0.0–3.0)
EOS PCT: 0.9 % (ref 0.0–5.0)
Eosinophils Absolute: 0.1 10*3/uL (ref 0.0–0.7)
HEMATOCRIT: 39 % (ref 36.0–46.0)
HEMOGLOBIN: 12.6 g/dL (ref 12.0–15.0)
LYMPHS PCT: 19.7 % (ref 12.0–46.0)
Lymphs Abs: 1.3 10*3/uL (ref 0.7–4.0)
MCHC: 32.4 g/dL (ref 30.0–36.0)
MCV: 89.2 fl (ref 78.0–100.0)
MONOS PCT: 11.6 % (ref 3.0–12.0)
Monocytes Absolute: 0.8 10*3/uL (ref 0.1–1.0)
Neutro Abs: 4.4 10*3/uL (ref 1.4–7.7)
Neutrophils Relative %: 67.5 % (ref 43.0–77.0)
Platelets: 355 10*3/uL (ref 150.0–400.0)
RBC: 4.37 Mil/uL (ref 3.87–5.11)
RDW: 15.1 % (ref 11.5–15.5)
WBC: 6.5 10*3/uL (ref 4.0–10.5)

## 2014-12-01 LAB — SEDIMENTATION RATE: SED RATE: 21 mm/h (ref 0–22)

## 2014-12-01 NOTE — Progress Notes (Signed)
Subjective:    Patient ID: [name], female    DOB: February 28, 1928, 81 y.o.   MRN: [id number]  Hypertension This is a chronic problem. The current episode started more than 1 year ago. The problem is uncontrolled. Pertinent negatives include no anxiety, blurred vision, chest pain, headaches, malaise/fatigue, neck pain, orthopnea, palpitations, peripheral edema, PND, shortness of breath or sweats. Agents associated with hypertension include NSAIDs. Past treatments include calcium channel blockers and beta blockers. The current treatment provides moderate improvement. There are no compliance problems.       Review of Systems  Constitutional: Negative.  Negative for fever, chills, malaise/fatigue, diaphoresis, appetite change and fatigue.  HENT: Negative.  Negative for sinus pressure.   Eyes: Negative.  Negative for blurred vision.  Respiratory: Negative.  Negative for shortness of breath.   Cardiovascular: Negative.  Negative for chest pain, palpitations, orthopnea, leg swelling and PND.  Gastrointestinal: Positive for blood in stool and anal bleeding. Negative for nausea, vomiting, abdominal pain, diarrhea, constipation, abdominal distention and rectal pain.       She has had an occasional episode of blood in the stool for the last 4 days  Endocrine: Negative.   Genitourinary: Negative.  Negative for dysuria, urgency, frequency, hematuria and pelvic pain.  Musculoskeletal: Positive for arthralgias. Negative for back pain, joint swelling and neck pain.  Skin: Negative.  Negative for rash.  Allergic/Immunologic: Negative.   Neurological: Negative.  Negative for dizziness, tremors, speech difficulty, weakness, light-headedness and headaches.  Hematological: Negative.  Negative for adenopathy. Does not bruise/bleed easily.  Psychiatric/Behavioral: Negative.        Objective:   Physical Exam  Constitutional: She is oriented to person, place, and time. She appears well-developed and  well-nourished.  Non-toxic appearance. She does not have a sickly appearance. She does not appear ill. No distress.  HENT:  Head: Normocephalic and atraumatic.  Mouth/Throat: Oropharynx is clear and moist. No oropharyngeal exudate.  Eyes: Conjunctivae are normal. Right eye exhibits no discharge. Left eye exhibits no discharge. No scleral icterus.  Neck: Normal range of motion. Neck supple. No JVD present. No tracheal deviation present. No thyromegaly present.  Cardiovascular: Normal rate, regular rhythm, normal heart sounds and intact distal pulses.  Exam reveals no gallop and no friction rub.   No murmur heard. Pulmonary/Chest: Effort normal and breath sounds normal. No stridor. No respiratory distress. She has no wheezes. She has no rales. She exhibits no tenderness.  Abdominal: Soft. Bowel sounds are normal. She exhibits no distension and no mass. There is no tenderness. There is no rebound and no guarding.  Genitourinary: Rectal exam shows external hemorrhoid. Rectal exam shows no internal hemorrhoid, no fissure, no mass, no tenderness and anal tone normal. Guaiac positive stool.  There is one very small uncomplicated external anal hemorrhoid and a mild amt of BRB  Musculoskeletal: Normal range of motion. She exhibits no edema or tenderness.  Lymphadenopathy:    She has no cervical adenopathy.  Neurological: She is oriented to person, place, and time.  Skin: Skin is warm and dry. No rash noted. She is not diaphoretic. No erythema. No pallor.      Lab Results  Component Value Date   WBC 5.9 11/14/2014   HGB 13.4 11/14/2014   HCT 40.5 11/14/2014   PLT 334.0 11/14/2014   GLUCOSE 101* 11/14/2014   CHOL 179 10/03/2014   TRIG 80.0 10/03/2014   HDL 60.80 10/03/2014   LDLDIRECT 140.3 11/11/2011   LDLCALC 102* 10/03/2014   ALT  17 10/03/2014   AST 26 10/03/2014   NA 140 11/14/2014   K 3.9 11/14/2014   CL 104 11/14/2014   CREATININE 0.93 11/14/2014   BUN 18 11/14/2014   CO2 33*  11/14/2014   TSH 1.02 10/03/2014   INR 1.1 02/09/2008      Assessment & Plan:

## 2014-12-01 NOTE — Patient Instructions (Signed)

## 2014-12-01 NOTE — Progress Notes (Signed)
Pre visit review using our clinic review tool, if applicable. No additional management support is needed unless otherwise documented below in the visit note. 

## 2014-12-02 ENCOUNTER — Encounter: Payer: Self-pay | Admitting: Internal Medicine

## 2014-12-02 NOTE — Assessment & Plan Note (Signed)
Her BP is not well controlled Will hold the nsaid for now She is anxious today so will not react to the BP She will return in about 5 days for a BP recheck

## 2014-12-02 NOTE — Assessment & Plan Note (Signed)
I think she has has a very mild diverticular bleed Her H and H are down only slightly and there has not been any brisk bleeding Will stop the nsaid She will let me know of there is a significant amount of blood loss Will recheck her in about 5 days with another CBC

## 2014-12-07 ENCOUNTER — Telehealth: Payer: Self-pay

## 2014-12-07 NOTE — Telephone Encounter (Signed)
Tried to call patient to schedule appointment for recheck with dr Yetta Barrejones, i placed call several times, i;m hearing a very fast busy signal, if patient calls back, she needs office visit with dr Yetta Barrejones

## 2014-12-14 ENCOUNTER — Telehealth: Payer: Self-pay

## 2014-12-14 NOTE — Telephone Encounter (Signed)
Mrs. Carloyn JaegerHollins called and stated that she needed a progress note faxed over to her lawyers office stating that the patient has dementia. She is currently trying to obtain a POA. The lawyers office fax number is [phone] and the name is Brayton LaymanBlair David McClanahan.

## 2014-12-14 NOTE — Telephone Encounter (Signed)
Unable to provide without a release of information, no call back number left for caller who is requesting this information.

## 2015-01-05 ENCOUNTER — Telehealth: Payer: Self-pay | Admitting: Internal Medicine

## 2015-01-05 NOTE — Telephone Encounter (Signed)
Medical records request are handled trough medical records department. Thanks

## 2015-01-05 NOTE — Telephone Encounter (Signed)
[name] who is an attorney with [name] office called regarding the guardianship for [name]. She needs some kind of documentation on her competency and any kind of records that are associated with this. Her fax #[phone] and phone # is [phone].

## 2015-01-05 NOTE — Telephone Encounter (Signed)
Attorney advise to seek medical records department for any type of records release. However will forward request for letter to PCP to address. Thanks

## 2015-01-05 NOTE — Telephone Encounter (Signed)
[name] an attorney from [employer] needs a letter stating her how her competency is regarding the guardianship. Her fax# is [phone] and her phone # is [phone]

## 2015-01-05 NOTE — Telephone Encounter (Signed)
This can only be done if there is a HIPPA release

## 2015-01-06 NOTE — Telephone Encounter (Signed)
No HIPPA release on file from attorney, therefore unable to release any information regarding pt as stated previously, also no letter will be created without this information.

## 2015-01-11 NOTE — Telephone Encounter (Signed)
[name] is requesting a call back in regards at [phone].  States she has spoken with Medical Records about [place] and medical records has referred her to Dr. Yetta BarreJones in regards to Letter.

## 2015-03-27 ENCOUNTER — Telehealth: Payer: Self-pay | Admitting: Internal Medicine

## 2015-03-27 NOTE — Telephone Encounter (Signed)
This lady has called again.  She has called 4 times this morning.  Can you please give her a call as soon has you get a chance

## 2015-03-27 NOTE — Telephone Encounter (Signed)
[place] called in [place]) she has several questions and is requesting a call back from a nurse.  She said that it is urgent   Best number [phone]

## 2015-03-30 NOTE — Telephone Encounter (Signed)
Documenting my phone calls from 8/22 and 8/23. I called on 8/22 and a women answered and stated I had the wrong number. Double checked the number I dialed it was correct but a women responded with a 336 number. Seems like the calls were maybe forwarded? Tried again with the same number on 8/23 and left a voicemail stating to call the office back. Voicemail stated the [facility] name on it.

## 2015-04-04 ENCOUNTER — Telehealth: Payer: Self-pay | Admitting: Pulmonary Disease

## 2015-04-04 NOTE — Telephone Encounter (Signed)
[name] says blood pressure is 190/108.  Patient is not on any blood pressure medication at all.  Showing no signs of stroke. No symptoms. She has had high blood pressure before, it dropped her blood pressure too low and they took her off the medication.  She is under a lot of stress, very anxious.  Scored 0 on memory test.  Irregular heartbeat.  Vital signs stable.  Current Outpatient Prescriptions on File Prior to Visit  Medication Sig Dispense Refill  . aspirin 81 MG tablet Take 81 mg by mouth daily.    . COMBIGAN 0.2-0.5 % ophthalmic solution     . dorzolamide-timolol (COSOPT) 22.3-6.8 MG/ML ophthalmic solution Place 1 drop into both eyes Three times a day.    . esomeprazole (NEXIUM) 40 MG capsule Take 1 capsule (40 mg total) by mouth daily. 90 capsule 3  . felodipine (PLENDIL) 5 MG 24 hr tablet TAKE 1 TABLET (5 MG TOTAL) BY MOUTH DAILY. 90 tablet 3  . lactose free nutrition (BOOST) LIQD 1 drink 2-3 times daily    . metoprolol succinate (TOPROL-XL) 50 MG 24 hr tablet Take 1 tablet (50 mg total) by mouth daily. Take with or immediately following a meal. 90 tablet 3  . Multiple Vitamins-Minerals (CENTRUM SILVER ULTRA WOMENS PO) Take 1 tablet by mouth daily.    [name] OVER THE COUNTER MEDICATION Ceraplex    . vitamin E 1000 UNIT capsule Take 1,000 Units by mouth daily.     No current facility-administered medications on file prior to visit.

## 2015-04-04 NOTE — Telephone Encounter (Signed)
lmtcb for Tara.  

## 2015-04-04 NOTE — Telephone Encounter (Addendum)
Left message for [name] to call back Number listed on TE is incorrect Called [facility] and obtained correct number for [name]  Correct number is [phone]

## 2015-04-04 NOTE — Telephone Encounter (Signed)
Spoke with Delice Bison from [facility] rec Advised Delice Bison that pt last saw Dr Yetta Barre 11/2014 for HTN and she needed to speak with them in regards to HTN Delice Bison stated that she would call their office today to make an urgent appt  Nothing further is needed at this time.

## 2015-04-04 NOTE — Telephone Encounter (Signed)
Per Dr. Kriste Basque- Patient has not been seen since 07/2013.  There are 2 bp meds on the medication list.  Dr. Yetta Barre is taking care of patient. Called and spoke with Delice Bison at [facility].  She says that Dr. Kriste Basque signed the paperwork for Referral.   I advised her that the only referrals I see in our chart were sent by Dr. Yetta Barre.  She said she is with a patient right now and needs for me to call her back in 10 minutes.  Discussed with Robynn Pane (since I will be with Dr. Vassie Loll in 10 minutes) and she will call Delice Bison back.   Delice Bison can be reached at [phone]

## 2015-04-06 ENCOUNTER — Ambulatory Visit (INDEPENDENT_AMBULATORY_CARE_PROVIDER_SITE_OTHER): Payer: Medicare Other | Admitting: Internal Medicine

## 2015-04-06 ENCOUNTER — Encounter: Payer: Self-pay | Admitting: Internal Medicine

## 2015-04-06 VITALS — BP 160/88 | HR 78 | Temp 98.8°F | Resp 16 | Ht 65.0 in | Wt 133.0 lb

## 2015-04-06 DIAGNOSIS — I10 Essential (primary) hypertension: Secondary | ICD-10-CM

## 2015-04-06 DIAGNOSIS — I2510 Atherosclerotic heart disease of native coronary artery without angina pectoris: Secondary | ICD-10-CM | POA: Diagnosis not present

## 2015-04-06 DIAGNOSIS — Z23 Encounter for immunization: Secondary | ICD-10-CM | POA: Diagnosis not present

## 2015-04-06 MED ORDER — FELODIPINE ER 5 MG PO TB24: ORAL_TABLET | ORAL | Status: DC

## 2015-04-06 MED ORDER — METOPROLOL SUCCINATE ER 50 MG PO TB24: 50.0000 mg | ORAL_TABLET | Freq: Every day | ORAL | Status: DC

## 2015-04-06 NOTE — Progress Notes (Signed)
Pre visit review using our clinic review tool, if applicable. No additional management support is needed unless otherwise documented below in the visit note. 

## 2015-04-06 NOTE — Patient Instructions (Signed)

## 2015-04-06 NOTE — Progress Notes (Signed)
Subjective:  Patient ID: [name], female    DOB: 28-May-1928  Age: 81 y.o. MRN: [id number]  CC: Hypertension   HPI [name] presents for a blood pressure check. Her caretaker checked her blood pressure couple months ago and it was 130/60, she thought that was too low so she stopped her antihypertensive therapy. Today she comes in complaining of a headache and her blood pressure is not well controlled.  Outpatient Prescriptions Prior to Visit  Medication Sig Dispense Refill  . COMBIGAN 0.2-0.5 % ophthalmic solution     . dorzolamide-timolol (COSOPT) 22.3-6.8 MG/ML ophthalmic solution Place 1 drop into both eyes Three times a day.    . lactose free nutrition (BOOST) LIQD 1 drink 2-3 times daily    . Multiple Vitamins-Minerals (CENTRUM SILVER ULTRA WOMENS PO) Take 1 tablet by mouth daily.    . felodipine (PLENDIL) 5 MG 24 hr tablet TAKE 1 TABLET (5 MG TOTAL) BY MOUTH DAILY. 90 tablet 3  . OVER THE COUNTER MEDICATION Ceraplex    . vitamin E 1000 UNIT capsule Take 1,000 Units by mouth daily.    [name] aspirin 81 MG tablet Take 81 mg by mouth daily.    [name] esomeprazole (NEXIUM) 40 MG capsule Take 1 capsule (40 mg total) by mouth daily. (Patient not taking: Reported on 04/06/2015) 90 capsule 3  . metoprolol succinate (TOPROL-XL) 50 MG 24 hr tablet Take 1 tablet (50 mg total) by mouth daily. Take with or immediately following a meal. (Patient not taking: Reported on 04/06/2015) 90 tablet 3   No facility-administered medications prior to visit.    ROS Review of Systems  Constitutional: Negative.  Negative for fever, chills, diaphoresis, appetite change and fatigue.  Eyes: Negative.   Respiratory: Negative.  Negative for cough, choking, chest tightness, shortness of breath and stridor.   Cardiovascular: Negative.  Negative for chest pain, palpitations and leg swelling.  Gastrointestinal: Negative.  Negative for nausea, vomiting, abdominal pain, diarrhea, constipation and blood in stool.    Endocrine: Negative.   Genitourinary: Negative.  Negative for hematuria, flank pain, decreased urine volume and difficulty urinating.  Musculoskeletal: Negative.   Skin: Negative.  Negative for rash.  Allergic/Immunologic: Negative.   Neurological: Positive for headaches. Negative for dizziness, tremors, seizures, syncope, facial asymmetry, speech difficulty, weakness, light-headedness and numbness.  Hematological: Negative.  Negative for adenopathy. Does not bruise/bleed easily.  Psychiatric/Behavioral: Positive for confusion and decreased concentration. Negative for sleep disturbance and agitation. The patient is not nervous/anxious.     Objective:  BP 160/88 mmHg  Pulse 78  Temp(Src) 98.8 F (37.1 C) (Oral)  Resp 16  Ht  (1.651 m)  Wt 133 lb (60.328 kg)  BMI 22.13 kg/m2  SpO2 97%  BP Readings from Last 3 Encounters:  04/06/15 160/88  11/14/14 150/80  10/03/14 130/68    Wt Readings from Last 3 Encounters:  04/06/15 133 lb (60.328 kg)  11/14/14 121 lb (54.885 kg)  10/03/14 119 lb (53.978 kg)    Physical Exam  Constitutional: She is oriented to person, place, and time. No distress.  HENT:  Head: Normocephalic and atraumatic.  Mouth/Throat: Oropharynx is clear and moist. No oropharyngeal exudate.  Eyes: Conjunctivae are normal. Right eye exhibits no discharge. Left eye exhibits no discharge. No scleral icterus.  Neck: Normal range of motion. Neck supple. No JVD present. No tracheal deviation present. No thyromegaly present.  Cardiovascular: Normal rate, regular rhythm, normal heart sounds and intact distal pulses.  Exam reveals no gallop  and no friction rub.   No murmur heard. Pulmonary/Chest: Effort normal and breath sounds normal. No stridor. No respiratory distress. She has no wheezes. She has no rales. She exhibits no tenderness.  Abdominal: Soft. Bowel sounds are normal. She exhibits no distension and no mass. There is no tenderness. There is no rebound and no  guarding.  Musculoskeletal: Normal range of motion. She exhibits no edema or tenderness.  Lymphadenopathy:    She has no cervical adenopathy.  Neurological: She is oriented to person, place, and time.  Skin: Skin is warm and dry. No rash noted. She is not diaphoretic. No erythema. No pallor.  Psychiatric: She has a normal mood and affect. Her behavior is normal. Judgment and thought content normal.  Vitals reviewed.   Lab Results  Component Value Date   WBC 6.5 12/01/2014   HGB 12.6 12/01/2014   HCT 39.0 12/01/2014   PLT 355.0 12/01/2014   GLUCOSE 101* 11/14/2014   CHOL 179 10/03/2014   TRIG 80.0 10/03/2014   HDL 60.80 10/03/2014   LDLDIRECT 140.3 11/11/2011   LDLCALC 102* 10/03/2014   ALT 17 10/03/2014   AST 26 10/03/2014   NA 140 11/14/2014   K 3.9 11/14/2014   CL 104 11/14/2014   CREATININE 0.93 11/14/2014   BUN 18 11/14/2014   CO2 33* 11/14/2014   TSH 1.02 10/03/2014   INR 1.1 02/09/2008    Dg Lumbar Spine Complete  11/14/2014   CLINICAL DATA:  Chronic low back pain with left sciatica pain. No known injury.  EXAM: LUMBAR SPINE - COMPLETE 4+ VIEW  COMPARISON:  02/20/2012 CT  FINDINGS: Diffuse degenerative disc disease and facet disease. Slight anterolisthesis of L3 on L4 and L4 on L5, stable since prior CT, likely related to facet disease. No fracture or acute subluxation. SI joints are symmetric.  IMPRESSION: Diffuse degenerative disc and facet disease.  No acute findings.   Electronically Signed   By: Charlett Nose M.D.   On: 11/14/2014 11:57    Assessment & Plan:   [name] was seen today for hypertension.  Diagnoses and all orders for this visit:  Essential hypertension- her blood pressure is not well controlled and she is symptomatic due to noncompliance. Will restart metoprolol and felodipine. We'll recheck her blood pressure in 2-3 months to see that it is well-controlled. -     metoprolol succinate (TOPROL-XL) 50 MG 24 hr tablet; Take 1 tablet (50 mg total) by  mouth daily. Take with or immediately following a meal. -     felodipine (PLENDIL) 5 MG 24 hr tablet; TAKE 1 TABLET (5 MG TOTAL) BY MOUTH DAILY.  Atherosclerosis of native coronary artery of native heart without angina pectoris- she does not report any chest pain or shortness of breath, will restart metoprolol. -     metoprolol succinate (TOPROL-XL) 50 MG 24 hr tablet; Take 1 tablet (50 mg total) by mouth daily. Take with or immediately following a meal.  Need for prophylactic vaccination and inoculation against influenza -     Flu vaccine HIGH DOSE PF  Need for zoster vaccination -     Varicella-zoster vaccine subcutaneous  I have discontinued [name]'s vitamin E and OVER THE COUNTER MEDICATION. I am also having her maintain her dorzolamide-timolol, Multiple Vitamins-Minerals (CENTRUM SILVER ULTRA WOMENS PO), lactose free nutrition, COMBIGAN, esomeprazole, aspirin, metoprolol succinate, and felodipine.  Meds ordered this encounter  Medications  . metoprolol succinate (TOPROL-XL) 50 MG 24 hr tablet    Sig: Take 1 tablet (50  mg total) by mouth daily. Take with or immediately following a meal.    Dispense:  90 tablet    Refill:  3  . felodipine (PLENDIL) 5 MG 24 hr tablet    Sig: TAKE 1 TABLET (5 MG TOTAL) BY MOUTH DAILY.    Dispense:  90 tablet    Refill:  3     Follow-up: Return in about 3 months (around 07/06/2015).  Sanda Linger, MD

## 2015-04-11 ENCOUNTER — Telehealth: Payer: Self-pay | Admitting: *Deleted

## 2015-04-11 MED ORDER — BOOST HIGH PROTEIN PO LIQD
1.0000 | Freq: Two times a day (BID) | ORAL | Status: DC
Start: 2015-04-11 — End: 2015-06-21

## 2015-04-11 NOTE — Telephone Encounter (Signed)
Daughter left msg on triage stating md recommended for mom to have Ensure or Boost everyday. Requesting rx to be sent for Boost..../lmb

## 2015-04-21 ENCOUNTER — Telehealth: Payer: Self-pay | Admitting: Internal Medicine

## 2015-04-21 NOTE — Telephone Encounter (Signed)
Is requesting patients last OV notes.  Can fax to [phone] or can call and will pick up.

## 2015-04-24 NOTE — Telephone Encounter (Signed)
done

## 2015-05-31 DIAGNOSIS — I10 Essential (primary) hypertension: Secondary | ICD-10-CM | POA: Diagnosis not present

## 2015-06-21 ENCOUNTER — Ambulatory Visit (INDEPENDENT_AMBULATORY_CARE_PROVIDER_SITE_OTHER): Payer: Medicare Other | Admitting: Internal Medicine

## 2015-06-21 ENCOUNTER — Encounter: Payer: Self-pay | Admitting: Internal Medicine

## 2015-06-21 VITALS — BP 168/98 | HR 91 | Temp 98.5°F | Resp 16 | Ht 65.0 in | Wt 138.0 lb

## 2015-06-21 DIAGNOSIS — M159 Polyosteoarthritis, unspecified: Secondary | ICD-10-CM

## 2015-06-21 DIAGNOSIS — F0391 Unspecified dementia with behavioral disturbance: Secondary | ICD-10-CM | POA: Diagnosis not present

## 2015-06-21 DIAGNOSIS — M150 Primary generalized (osteo)arthritis: Secondary | ICD-10-CM

## 2015-06-21 DIAGNOSIS — I10 Essential (primary) hypertension: Secondary | ICD-10-CM

## 2015-06-21 DIAGNOSIS — F03918 Unspecified dementia, unspecified severity, with other behavioral disturbance: Secondary | ICD-10-CM

## 2015-06-21 MED ORDER — FELODIPINE ER 5 MG PO TB24: ORAL_TABLET | ORAL | Status: DC

## 2015-06-21 MED ORDER — BOOST HIGH PROTEIN PO LIQD: 1.0000 | Freq: Two times a day (BID) | ORAL | Status: AC

## 2015-06-21 MED ORDER — MELOXICAM 7.5 MG PO TABS: 7.5000 mg | ORAL_TABLET | Freq: Every day | ORAL | Status: DC

## 2015-06-21 NOTE — Progress Notes (Signed)
Pre visit review using our clinic review tool, if applicable. No additional management support is needed unless otherwise documented below in the visit note. 

## 2015-06-21 NOTE — Patient Instructions (Signed)
Hypertension Hypertension, commonly called high blood pressure, is when the force of blood pumping through your arteries is too strong. Your arteries are the blood vessels that carry blood from your heart throughout your body. A blood pressure reading consists of a higher number over a lower number, such as 110/72. The higher number (systolic) is the pressure inside your arteries when your heart pumps. The lower number (diastolic) is the pressure inside your arteries when your heart relaxes. Ideally you want your blood pressure below 120/80. Hypertension forces your heart to work harder to pump blood. Your arteries may become narrow or stiff. Having untreated or uncontrolled hypertension can cause heart attack, stroke, kidney disease, and other problems. RISK FACTORS Some risk factors for high blood pressure are controllable. Others are not.  Risk factors you cannot control include:   Race. You may be at higher risk if you are African American.  Age. Risk increases with age.  Gender. Men are at higher risk than women before age 45 years. After age 65, women are at higher risk than men. Risk factors you can control include:  Not getting enough exercise or physical activity.  Being overweight.  Getting too much fat, sugar, calories, or salt in your diet.  Drinking too much alcohol. SIGNS AND SYMPTOMS Hypertension does not usually cause signs or symptoms. Extremely high blood pressure (hypertensive crisis) may cause headache, anxiety, shortness of breath, and nosebleed. DIAGNOSIS To check if you have hypertension, your health care provider will measure your blood pressure while you are seated, with your arm held at the level of your heart. It should be measured at least twice using the same arm. Certain conditions can cause a difference in blood pressure between your right and left arms. A blood pressure reading that is higher than normal on one occasion does not mean that you need treatment. If  it is not clear whether you have high blood pressure, you may be asked to return on a different day to have your blood pressure checked again. Or, you may be asked to monitor your blood pressure at home for 1 or more weeks. TREATMENT Treating high blood pressure includes making lifestyle changes and possibly taking medicine. Living a healthy lifestyle can help lower high blood pressure. You may need to change some of your habits. Lifestyle changes may include:  Following the DASH diet. This diet is high in fruits, vegetables, and whole grains. It is low in salt, red meat, and added sugars.  Keep your sodium intake below 2,300 mg per day.  Getting at least 30-45 minutes of aerobic exercise at least 4 times per week.  Losing weight if necessary.  Not smoking.  Limiting alcoholic beverages.  Learning ways to reduce stress. Your health care provider may prescribe medicine if lifestyle changes are not enough to get your blood pressure under control, and if one of the following is true:  You are 18-59 years of age and your systolic blood pressure is above 140.  You are 60 years of age or older, and your systolic blood pressure is above 150.  Your diastolic blood pressure is above 90.  You have diabetes, and your systolic blood pressure is over 140 or your diastolic blood pressure is over 90.  You have kidney disease and your blood pressure is above 140/90.  You have heart disease and your blood pressure is above 140/90. Your personal target blood pressure may vary depending on your medical conditions, your age, and other factors. HOME CARE INSTRUCTIONS    Have your blood pressure rechecked as directed by your health care provider.   Take medicines only as directed by your health care provider. Follow the directions carefully. Blood pressure medicines must be taken as prescribed. The medicine does not work as well when you skip doses. Skipping doses also puts you at risk for  problems.  Do not smoke.   Monitor your blood pressure at home as directed by your health care provider. SEEK MEDICAL CARE IF:   You think you are having a reaction to medicines taken.  You have recurrent headaches or feel dizzy.  You have swelling in your ankles.  You have trouble with your vision. SEEK IMMEDIATE MEDICAL CARE IF:  You develop a severe headache or confusion.  You have unusual weakness, numbness, or feel faint.  You have severe chest or abdominal pain.  You vomit repeatedly.  You have trouble breathing. MAKE SURE YOU:   Understand these instructions.  Will watch your condition.  Will get help right away if you are not doing well or get worse.   This information is not intended to replace advice given to you by your health care provider. Make sure you discuss any questions you have with your health care provider.   Document Released: 07/22/2005 Document Revised: 12/06/2014 Document Reviewed: 05/14/2013 Elsevier Interactive Patient Education 2016 Elsevier Inc.  

## 2015-06-21 NOTE — Progress Notes (Signed)
Subjective:  Patient ID: [name], female    DOB: 1927/10/30  Age: 81 y.o. MRN: [id number]  CC: Hypertension and Osteoarthritis   HPI [name] presents for follow-up. She has misplaced her medication Plendil and has not taken it for several weeks. Subsequently her blood pressure is not well controlled. She complains of arthritis pain in her hips and knees and needs a refill on her anti-inflammatory. She and her daughter are also worried about her memory and forgetfulness and are asking that she see a neurologist.  Outpatient Prescriptions Prior to Visit  Medication Sig Dispense Refill  . aspirin 81 MG tablet Take 81 mg by mouth daily.    . COMBIGAN 0.2-0.5 % ophthalmic solution     . dorzolamide-timolol (COSOPT) 22.3-6.8 MG/ML ophthalmic solution Place 1 drop into both eyes Three times a day.    . lactose free nutrition (BOOST) LIQD 1 drink 2-3 times daily    . metoprolol succinate (TOPROL-XL) 50 MG 24 hr tablet Take 1 tablet (50 mg total) by mouth daily. Take with or immediately following a meal. 90 tablet 3  . Multiple Vitamins-Minerals (CENTRUM SILVER ULTRA WOMENS PO) Take 1 tablet by mouth daily.    . feeding supplement (BOOST HIGH PROTEIN) LIQD Take 237 mLs by mouth 2 (two) times daily between meals. 237 mL 11  . felodipine (PLENDIL) 5 MG 24 hr tablet TAKE 1 TABLET (5 MG TOTAL) BY MOUTH DAILY. 90 tablet 3  . esomeprazole (NEXIUM) 40 MG capsule Take 1 capsule (40 mg total) by mouth daily. (Patient not taking: Reported on 06/21/2015) 90 capsule 3   No facility-administered medications prior to visit.    ROS Review of Systems  Constitutional: Negative.  Negative for fever, chills, diaphoresis, appetite change and fatigue.  HENT: Negative.   Eyes: Negative.   Respiratory: Negative.  Negative for cough, choking, chest tightness, shortness of breath and stridor.   Cardiovascular: Negative.  Negative for chest pain, palpitations and leg swelling.  Gastrointestinal:  Negative.  Negative for nausea, vomiting, abdominal pain, diarrhea and constipation.  Endocrine: Negative.   Genitourinary: Negative.   Musculoskeletal: Positive for arthralgias. Negative for myalgias, back pain, joint swelling and neck stiffness.  Skin: Negative.   Allergic/Immunologic: Negative.   Neurological: Negative.  Negative for dizziness, tremors, syncope, light-headedness and numbness.  Hematological: Negative.  Negative for adenopathy. Does not bruise/bleed easily.  Psychiatric/Behavioral: Positive for confusion and decreased concentration. Negative for sleep disturbance, dysphoric mood and agitation. The patient is not hyperactive.     Objective:  BP 168/98 mmHg  Pulse 91  Temp(Src) 98.5 F (36.9 C) (Oral)  Resp 16  Ht  (1.651 m)  Wt 138 lb (62.596 kg)  BMI 22.96 kg/m2  SpO2 98%  BP Readings from Last 3 Encounters:  06/21/15 168/98  04/06/15 160/88  11/14/14 150/80    Wt Readings from Last 3 Encounters:  06/21/15 138 lb (62.596 kg)  04/06/15 133 lb (60.328 kg)  11/14/14 121 lb (54.885 kg)    Physical Exam  Constitutional: She is oriented to person, place, and time. No distress.  HENT:  Head: Normocephalic and atraumatic.  Mouth/Throat: Oropharynx is clear and moist. No oropharyngeal exudate.  Eyes: Conjunctivae are normal. Right eye exhibits no discharge. Left eye exhibits no discharge. No scleral icterus.  Neck: Normal range of motion. Neck supple. No JVD present. No tracheal deviation present. No thyromegaly present.  Cardiovascular: Normal rate, regular rhythm, normal heart sounds and intact distal pulses.  Exam reveals no  gallop and no friction rub.   No murmur heard. Pulmonary/Chest: Effort normal and breath sounds normal. No stridor. No respiratory distress. She has no wheezes. She has no rales. She exhibits no tenderness.  Abdominal: Soft. Bowel sounds are normal. She exhibits no distension and no mass. There is no tenderness. There is no rebound  and no guarding.  Musculoskeletal: Normal range of motion. She exhibits no edema or tenderness.       Right hip: Normal.       Left hip: Normal.       Right knee: Normal.       Left knee: Normal.  Lymphadenopathy:    She has no cervical adenopathy.  Neurological: She is oriented to person, place, and time.  Skin: Skin is warm and dry. No rash noted. She is not diaphoretic. No erythema. No pallor.  Psychiatric: She has a normal mood and affect. Her behavior is normal. Judgment and thought content normal.  Vitals reviewed.   Lab Results  Component Value Date   WBC 6.5 12/01/2014   HGB 12.6 12/01/2014   HCT 39.0 12/01/2014   PLT 355.0 12/01/2014   GLUCOSE 101* 11/14/2014   CHOL 179 10/03/2014   TRIG 80.0 10/03/2014   HDL 60.80 10/03/2014   LDLDIRECT 140.3 11/11/2011   LDLCALC 102* 10/03/2014   ALT 17 10/03/2014   AST 26 10/03/2014   NA 140 11/14/2014   K 3.9 11/14/2014   CL 104 11/14/2014   CREATININE 0.93 11/14/2014   BUN 18 11/14/2014   CO2 33* 11/14/2014   TSH 1.02 10/03/2014   INR 1.1 02/09/2008    Dg Lumbar Spine Complete  11/14/2014  CLINICAL DATA:  Chronic low back pain with left sciatica pain. No known injury. EXAM: LUMBAR SPINE - COMPLETE 4+ VIEW COMPARISON:  02/20/2012 CT FINDINGS: Diffuse degenerative disc disease and facet disease. Slight anterolisthesis of L3 on L4 and L4 on L5, stable since prior CT, likely related to facet disease. No fracture or acute subluxation. SI joints are symmetric. IMPRESSION: Diffuse degenerative disc and facet disease.  No acute findings. Electronically Signed   By: Charlett NoseKevin  Dover M.D.   On: 11/14/2014 11:57    Assessment & Plan:   [name] was seen today for hypertension and osteoarthritis.  Diagnoses and all orders for this visit:  Essential hypertension- her blood pressure is not well controlled, will restart the calcium channel blocker -     Discontinue: felodipine (PLENDIL) 5 MG 24 hr tablet; TAKE 1 TABLET (5 MG TOTAL) BY MOUTH  DAILY. -     felodipine (PLENDIL) 5 MG 24 hr tablet; TAKE 1 TABLET (5 MG TOTAL) BY MOUTH DAILY.  Dementia with behavioral disturbance- referral as requested -     Ambulatory referral to Neurology  Primary osteoarthritis involving multiple joints- will treat the arthritis pain with an anti-inflammatory -     meloxicam (MOBIC) 7.5 MG tablet; Take 1 tablet (7.5 mg total) by mouth daily.  Other orders -     feeding supplement (BOOST HIGH PROTEIN) LIQD; Take 237 mLs by mouth 2 (two) times daily between meals.  I am having Ms. Homeyer start on meloxicam. I am also having her maintain her dorzolamide-timolol, Multiple Vitamins-Minerals (CENTRUM SILVER ULTRA WOMENS PO), lactose free nutrition, COMBIGAN, esomeprazole, aspirin, metoprolol succinate, feeding supplement, and felodipine.  Meds ordered this encounter  Medications  . DISCONTD: felodipine (PLENDIL) 5 MG 24 hr tablet    Sig: TAKE 1 TABLET (5 MG TOTAL) BY MOUTH DAILY.    Dispense:  90 tablet    Refill:  3  . feeding supplement (BOOST HIGH PROTEIN) LIQD    Sig: Take 237 mLs by mouth 2 (two) times daily between meals.    Dispense:  237 mL    Refill:  11  . felodipine (PLENDIL) 5 MG 24 hr tablet    Sig: TAKE 1 TABLET (5 MG TOTAL) BY MOUTH DAILY.    Dispense:  90 tablet    Refill:  3  . meloxicam (MOBIC) 7.5 MG tablet    Sig: Take 1 tablet (7.5 mg total) by mouth daily.    Dispense:  30 tablet    Refill:  11     Follow-up: Return in about 4 months (around 10/19/2015).  Sanda Linger, MD

## 2015-06-28 ENCOUNTER — Encounter: Payer: Self-pay | Admitting: Internal Medicine

## 2015-07-06 ENCOUNTER — Ambulatory Visit: Payer: Medicare Other | Admitting: Internal Medicine

## 2015-07-12 ENCOUNTER — Telehealth: Payer: Self-pay | Admitting: Internal Medicine

## 2015-07-12 NOTE — Telephone Encounter (Signed)
Left [facility] vm to look into getting patient transferred into a memory care or alzheimer's unit as soon as possible for her safety.

## 2015-07-12 NOTE — Telephone Encounter (Signed)
I referred her to neurology a month ago. Please find out why she has not seen the neurologist yet. For safety she needs to be transferred to a memory or Alzheimer's unit immediately.

## 2015-07-12 NOTE — Telephone Encounter (Signed)
Is calling because patient was found outside her care facility when workers reported to work at [employer]6:00am.  Do not know how long patient was outside.  Would like to know what the next step would be in patient care?  Would like to know if she could get an appointment to be observed for alzheimers and dementia.  Feels like patient needs an urgent appointment in order to go to the next level in a care facility.

## 2015-07-12 NOTE — Telephone Encounter (Signed)
Patient is scheduled with neurologist on 1/9.  I will call guardian back to notify that she needs to look for a memory or alzheimer's unit for patient.

## 2015-07-13 ENCOUNTER — Telehealth: Payer: Self-pay | Admitting: Internal Medicine

## 2015-07-13 ENCOUNTER — Encounter: Payer: Self-pay | Admitting: Internal Medicine

## 2015-07-13 ENCOUNTER — Ambulatory Visit (INDEPENDENT_AMBULATORY_CARE_PROVIDER_SITE_OTHER): Payer: Medicare Other | Admitting: Internal Medicine

## 2015-07-13 VITALS — BP 148/88 | HR 68 | Temp 98.4°F | Resp 20 | Ht 65.0 in | Wt 138.0 lb

## 2015-07-13 DIAGNOSIS — F03918 Unspecified dementia, unspecified severity, with other behavioral disturbance: Secondary | ICD-10-CM

## 2015-07-13 DIAGNOSIS — F0391 Unspecified dementia with behavioral disturbance: Secondary | ICD-10-CM

## 2015-07-13 NOTE — Progress Notes (Signed)
Subjective:  Patient ID: [name], female    DOB: 1928/03/19  Age: 81 y.o. MRN: [id number]  CC: Dementia   HPI Atzi Ketchen Hartwig presents for follow-up. She comes in today with a caretaker. She was found sitting on a bench outside of her assisted living facility yesterday at 6 in the morning. No one knows how long she was sitting out there. There is been no concern that she was harmed or injured. Her memory is declining. I was informed of these events yesterday and asked that she be placed in a memory care or Alzheimer's unit. She was referred to her neurology a month ago but has not seen a neurologist yet. I have told the family that I don't think there is much thickened be done about her dementia and that they should just prepare to take good care of her and to monitor her closely. The family has not accepted those recommendations and her pushing for her to see a neurologist. The caretaker today he asked that I give her some names and numbers of facilities that have a memory care unit in the [place] area.  Outpatient Prescriptions Prior to Visit  Medication Sig Dispense Refill  . aspirin 81 MG tablet Take 81 mg by mouth daily.    . COMBIGAN 0.2-0.5 % ophthalmic solution     . dorzolamide-timolol (COSOPT) 22.3-6.8 MG/ML ophthalmic solution Place 1 drop into both eyes Three times a day.    . esomeprazole (NEXIUM) 40 MG capsule Take 1 capsule (40 mg total) by mouth daily. 90 capsule 3  . feeding supplement (BOOST HIGH PROTEIN) LIQD Take 237 mLs by mouth 2 (two) times daily between meals. 237 mL 11  . felodipine (PLENDIL) 5 MG 24 hr tablet TAKE 1 TABLET (5 MG TOTAL) BY MOUTH DAILY. 90 tablet 3  . lactose free nutrition (BOOST) LIQD 1 drink 2-3 times daily    . meloxicam (MOBIC) 7.5 MG tablet Take 1 tablet (7.5 mg total) by mouth daily. 30 tablet 11  . metoprolol succinate (TOPROL-XL) 50 MG 24 hr tablet Take 1 tablet (50 mg total) by mouth daily. Take with or immediately following a meal.  90 tablet 3  . Multiple Vitamins-Minerals (CENTRUM SILVER ULTRA WOMENS PO) Take 1 tablet by mouth daily.     No facility-administered medications prior to visit.    ROS Review of Systems  Constitutional: Negative.  Negative for fever, chills, diaphoresis, activity change, appetite change, fatigue and unexpected weight change.  HENT: Negative.   Eyes: Negative.   Respiratory: Negative.  Negative for cough, choking, chest tightness, shortness of breath and stridor.   Cardiovascular: Negative.  Negative for chest pain, palpitations and leg swelling.  Gastrointestinal: Negative.  Negative for nausea, vomiting, abdominal pain, diarrhea, constipation and blood in stool.  Endocrine: Negative.   Genitourinary: Negative.   Musculoskeletal: Negative.  Negative for myalgias.  Skin: Negative.  Negative for rash.  Allergic/Immunologic: Negative.   Neurological: Negative.  Negative for dizziness, tremors, seizures, weakness, light-headedness, numbness and headaches.  Hematological: Negative.  Negative for adenopathy. Does not bruise/bleed easily.  Psychiatric/Behavioral: Positive for behavioral problems, confusion, sleep disturbance and decreased concentration. Negative for suicidal ideas, hallucinations, self-injury, dysphoric mood and agitation. The patient is not nervous/anxious and is not hyperactive.     Objective:  BP 148/88 mmHg  Pulse 68  Temp(Src) 98.4 F (36.9 C) (Oral)  Resp 20  Ht 5\' 5"  (1.651 m)  Wt 138 lb (62.596 kg)  BMI 22.96 kg/m2  SpO2 98%  BP Readings from Last 3 Encounters:  07/13/15 148/88  06/21/15 168/98  04/06/15 160/88    Wt Readings from Last 3 Encounters:  07/13/15 138 lb (62.596 kg)  06/21/15 138 lb (62.596 kg)  04/06/15 133 lb (60.328 kg)    Physical Exam  Constitutional: She is oriented to person, place, and time. No distress.  HENT:  Mouth/Throat: Oropharynx is clear and moist. No oropharyngeal exudate.  Eyes: Conjunctivae are normal. Right eye  exhibits no discharge. Left eye exhibits no discharge. No scleral icterus.  Neck: Normal range of motion. Neck supple. No JVD present. No tracheal deviation present. No thyromegaly present.  Cardiovascular: Normal rate, regular rhythm, normal heart sounds and intact distal pulses.  Exam reveals no gallop and no friction rub.   No murmur heard. Pulmonary/Chest: Effort normal and breath sounds normal. No stridor. No respiratory distress. She has no wheezes. She has no rales. She exhibits no tenderness.  Abdominal: Soft. Bowel sounds are normal. She exhibits no distension and no mass. There is no tenderness. There is no rebound and no guarding.  Musculoskeletal: Normal range of motion. She exhibits no edema or tenderness.  Lymphadenopathy:    She has no cervical adenopathy.  Neurological: She is oriented to person, place, and time.  Skin: Skin is warm and dry. No rash noted. She is not diaphoretic. No erythema. No pallor.  Psychiatric: She has a normal mood and affect. Judgment and thought content normal. Her mood appears not anxious. Her speech is not rapid and/or pressured, not delayed and not tangential. She is slowed and withdrawn. Cognition and memory are impaired. She does not exhibit a depressed mood. She exhibits abnormal recent memory and abnormal remote memory. She is inattentive.  Vitals reviewed.   Lab Results  Component Value Date   WBC 6.5 12/01/2014   HGB 12.6 12/01/2014   HCT 39.0 12/01/2014   PLT 355.0 12/01/2014   GLUCOSE 101* 11/14/2014   CHOL 179 10/03/2014   TRIG 80.0 10/03/2014   HDL 60.80 10/03/2014   LDLDIRECT 140.3 11/11/2011   LDLCALC 102* 10/03/2014   ALT 17 10/03/2014   AST 26 10/03/2014   NA 140 11/14/2014   K 3.9 11/14/2014   CL 104 11/14/2014   CREATININE 0.93 11/14/2014   BUN 18 11/14/2014   CO2 33* 11/14/2014   TSH 1.02 10/03/2014   INR 1.1 02/09/2008    Dg Lumbar Spine Complete  11/14/2014  CLINICAL DATA:  Chronic low back pain with left sciatica  pain. No known injury. EXAM: LUMBAR SPINE - COMPLETE 4+ VIEW COMPARISON:  02/20/2012 CT FINDINGS: Diffuse degenerative disc disease and facet disease. Slight anterolisthesis of L3 on L4 and L4 on L5, stable since prior CT, likely related to facet disease. No fracture or acute subluxation. SI joints are symmetric. IMPRESSION: Diffuse degenerative disc and facet disease.  No acute findings. Electronically Signed   By: Charlett Nose M.D.   On: 11/14/2014 11:57    Assessment & Plan:   There are no diagnoses linked to this encounter. I am having Ms. Bleich maintain her dorzolamide-timolol, Multiple Vitamins-Minerals (CENTRUM SILVER ULTRA WOMENS PO), lactose free nutrition, COMBIGAN, esomeprazole, aspirin, metoprolol succinate, feeding supplement, felodipine, and meloxicam.  No orders of the defined types were placed in this encounter.     Follow-up: Return if symptoms worsen or fail to improve.  Sanda Linger, MD

## 2015-07-13 NOTE — Telephone Encounter (Signed)
Please advise 

## 2015-07-13 NOTE — Telephone Encounter (Signed)
Mrs. Eber HongMaban, care giver, called state that Mrs. Johannsen was just in to see Dr. Yetta BarreJones and Dr. Yetta BarreJones mention that the family should not leave Mrs. Kueker alone due to health condition. She was wondering if she can get that in witting so she can give to Mrs. Bascomb' family. Please advise.   Phone # [phone]

## 2015-07-13 NOTE — Assessment & Plan Note (Addendum)
I gave her family member/caretaker the names and numbers of 4 different locations in [place] that provide memory care. I asked her to stay with her and monitor her 24 hours a day for her safety. She will see neurology soon.

## 2015-07-13 NOTE — Progress Notes (Signed)
Pre visit review using our clinic review tool, if applicable. No additional management support is needed unless otherwise documented below in the visit note. 

## 2015-07-13 NOTE — Patient Instructions (Signed)

## 2015-07-15 ENCOUNTER — Encounter: Payer: Self-pay | Admitting: Internal Medicine

## 2015-07-15 NOTE — Telephone Encounter (Signed)
Letter written

## 2015-07-17 NOTE — Telephone Encounter (Signed)
LMOVM to call regarding letter.

## 2015-08-14 ENCOUNTER — Ambulatory Visit: Payer: Medicare Other | Admitting: Neurology

## 2015-08-25 ENCOUNTER — Ambulatory Visit (INDEPENDENT_AMBULATORY_CARE_PROVIDER_SITE_OTHER): Payer: Medicare Other | Admitting: Neurology

## 2015-08-25 ENCOUNTER — Encounter: Payer: Self-pay | Admitting: Neurology

## 2015-08-25 VITALS — BP 150/82 | HR 81 | Ht 61.0 in | Wt 141.0 lb

## 2015-08-25 DIAGNOSIS — F0390 Unspecified dementia without behavioral disturbance: Secondary | ICD-10-CM | POA: Diagnosis not present

## 2015-08-25 DIAGNOSIS — F03B0 Unspecified dementia, moderate, without behavioral disturbance, psychotic disturbance, mood disturbance, and anxiety: Secondary | ICD-10-CM

## 2015-08-25 MED ORDER — MEMANTINE HCL ER 28 MG PO CP24: ORAL_CAPSULE | ORAL | Status: DC

## 2015-08-25 MED ORDER — MEMANTINE HCL ER 7 & 14 & 21 &28 MG PO CP24: ORAL_CAPSULE | ORAL | Status: DC

## 2015-08-25 NOTE — Patient Instructions (Addendum)
1. Schedule head CT without contrast 2. Continue Aricept 3. Start Namenda XR starter pack. After finishing with sample, your new prescription will be in the pharmacy for Namenda XR  daily 4. Highly recommend 24/7 care, agree with moving to Memory Care Facilities 5. Follow-up in 1 year

## 2015-08-25 NOTE — Progress Notes (Addendum)
NEUROLOGY CONSULTATION NOTE  [name] MRN: [id number] DOB: 12-Feb-1928  Referring provider: Dr. Scarlette Calico Primary care provider: Dr. Scarlette Calico  Reason for consult:  Evaluation of dementia  Dear Dr Ronnald Ramp:  Thank you for your kind referral of [name] for consultation of the above symptoms. Although her history is well known to you, please allow me to reiterate it for the purpose of our medical record. The patient was accompanied to the clinic by her friend who also provides collateral information. Records and images were personally reviewed where available.  HISTORY OF PRESENT ILLNESS: This is a pleasant 82 year old right-handed woman with a history of hypertension, hyperlipidemia, presenting for evaluation of dementia. She has a caregiver who stays with her 7 days a week, but is on vacation and currently comes in today with a friend who has known her for a year. The patient reports that she has no living relatives except for her sister-in-law, who is her medical power of attorney. The patient is a poor historian, she reports that her memory is "sometimes good, but most of the time not as good as she wants it to be." She had previously been living in her house, then moved to an independent living facility where she has a common dining area. She has a caregiver staying with her daily, but now that the caregiver is on vacation, friends are alteranating looking after her several hours a day. She initially reported she pays her bills, but her friend reminded her that her sister-in-law takes car of her bills. On review of EPIC notes, she was found sitting on a bench outside her ALF at 6am last month. They were unsure how long she was sitting there, no injuries. She does not drive.  She denies any headaches, dizziness, diplopia, dysarthria, dysphagia, neck pain, focal numbness/tingling/weakness, bowel/bladder dysfunction. She has occasional neck pain. Her friend of 1 year has noticed  slight worsening of memory since she met her. She denies any family history of dementia, no history of significant head injuries or alcohol intake.   Laboratory Data: Lab Results  Component Value Date   WBC 6.5 12/01/2014   HGB 12.6 12/01/2014   HCT 39.0 12/01/2014   MCV 89.2 12/01/2014   PLT 355.0 12/01/2014     Chemistry      Component Value Date/Time   NA 140 11/14/2014 1047   K 3.9 11/14/2014 1047   CL 104 11/14/2014 1047   CO2 33* 11/14/2014 1047   BUN 18 11/14/2014 1047   CREATININE 0.93 11/14/2014 1047      Component Value Date/Time   CALCIUM 10.1 11/14/2014 1047   ALKPHOS 56 10/03/2014 1544   AST 26 10/03/2014 1544   ALT 17 10/03/2014 1544   BILITOT 0.8 10/03/2014 1544     Lab Results  Component Value Date   TSH 1.02 10/03/2014     PAST MEDICAL HISTORY: Past Medical History  Diagnosis Date  . Glaucoma   . Hearing loss   . Bronchitis, mucopurulent recurrent ([place])   . Hypertension   . CAD (coronary artery disease)   . Palpitations   . Hypercholesteremia   . Thyroid nodule   . GERD (gastroesophageal reflux disease)   . Diverticulosis of colon   . Personal history of other diseases of digestive system   . Hx of adenomatous colonic polyps   . Lumbar back pain   . Vitamin D deficiency   . Abnormality of gait   . Anxiety   .  Anemia   . Dysrhythmia     OCC HEART PALPITATION DR Johnsie Cancel  . Depression   . OSA (obstructive sleep apnea)     CPAP  MANY YRS AGO NOT SURE WHERE DONE  . Hiatal hernia   . DJD (degenerative joint disease)   . Chronic osteomyelitis, other specified site     PAST SURGICAL HISTORY: Past Surgical History  Procedure Laterality Date  . Appendectomy    . Cholecystectomy    . Abdominal hysterectomy    . Total knee arthroplasty      left  . Total knee arthroplasty      right  . Cataract extraction    . Inguinal hernia repair  05/21/2012    WITH MESH   . Ventral hernia repair  05/21/2012    Procedure: LAPAROSCOPIC VENTRAL  HERNIA;  Surgeon: Gayland Curry, MD,FACS;  Location: [facility];  Service: General;  Laterality: N/A;    MEDICATIONS: Current Outpatient Prescriptions on File Prior to Visit  Medication Sig Dispense Refill  . aspirin 81 MG tablet Take 81 mg by mouth daily.    . COMBIGAN 0.2-0.5 % ophthalmic solution 1 drop every 12 (twelve) hours.     . dorzolamide-timolol (COSOPT) 22.3-6.8 MG/ML ophthalmic solution Place 1 drop into both eyes Three times a day.    . esomeprazole (NEXIUM) 40 MG capsule Take 1 capsule (40 mg total) by mouth daily. 90 capsule 3  . feeding supplement (BOOST HIGH PROTEIN) LIQD Take 237 mLs by mouth 2 (two) times daily between meals. 237 mL 11  . felodipine (PLENDIL) 5 MG 24 hr tablet TAKE 1 TABLET (5 MG TOTAL) BY MOUTH DAILY. 90 tablet 3  . lactose free nutrition (BOOST) LIQD 1 drink 2-3 times daily    . meloxicam (MOBIC) 7.5 MG tablet Take 1 tablet (7.5 mg total) by mouth daily. 30 tablet 11  . metoprolol succinate (TOPROL-XL) 50 MG 24 hr tablet Take 1 tablet (50 mg total) by mouth daily. Take with or immediately following a meal. 90 tablet 3  . Multiple Vitamins-Minerals (CENTRUM SILVER ULTRA WOMENS PO) Take 1 tablet by mouth daily.     No current facility-administered medications on file prior to visit.    ALLERGIES: Allergies  Allergen Reactions  . Amoxicillin Itching    Chest and back  . Cephalexin Itching    Chest and back  . Penicillins Itching    Chest and back    FAMILY HISTORY: No family history on file.  SOCIAL HISTORY: Social History   Social History  . Marital Status: Widowed    Spouse Name: N/A  . Number of Children: 2  . Years of Education: N/A   Occupational History  .     Social History Main Topics  . Smoking status: Never Smoker   . Smokeless tobacco: Never Used  . Alcohol Use: No  . Drug Use: No  . Sexual Activity: Not on file   Other Topics Concern  . Not on file   Social History Narrative    REVIEW OF SYSTEMS: Constitutional: No  fevers, chills, or sweats, no generalized fatigue, change in appetite Eyes: No visual changes, double vision, eye pain Ear, nose and throat: No hearing loss, ear pain, nasal congestion, sore throat Cardiovascular: No chest pain, palpitations Respiratory:  No shortness of breath at rest or with exertion, wheezes GastrointestinaI: No nausea, vomiting, diarrhea, abdominal pain, fecal incontinence Genitourinary:  No dysuria, urinary retention or frequency Musculoskeletal:  No neck pain,+ back pain Integumentary: No rash,  pruritus, skin lesions Neurological: as above Psychiatric: No depression, insomnia, anxiety Endocrine: No palpitations, fatigue, diaphoresis, mood swings, change in appetite, change in weight, increased thirst Hematologic/Lymphatic:  No anemia, purpura, petechiae. Allergic/Immunologic: no itchy/runny eyes, nasal congestion, recent allergic reactions, rashes  PHYSICAL EXAM: Filed Vitals:   08/25/15 1240  BP: 150/82  Pulse: 81   General: No acute distress Head:  Normocephalic/atraumatic Eyes: Fundoscopic exam shows bilateral sharp discs, no vessel changes, exudates, or hemorrhages Neck: supple, no paraspinal tenderness, full range of motion Back: No paraspinal tenderness Heart: regular rate and rhythm Lungs: Clear to auscultation bilaterally. Vascular: No carotid bruits. Skin/Extremities: No rash, no edema Neurological Exam: Mental status: alert and oriented to person, place, no dysarthria or aphasia, Fund of knowledge is appropriate.  Remote memory intact.  Attention and concentration are normal.    Able to name objects and repeat phrases. CDT 0/5 MMSE - Mini Mental State Exam 08/25/2015  Orientation to time 0  Orientation to Place 4  Registration 3  Attention/ Calculation 0  Recall 0  Language- name 2 objects 2  Language- repeat 1  Language- follow 3 step command 3  Language- read & follow direction 1  Write a sentence 0  Copy design 0  Total score 14    Cranial nerves: CN I: not tested CN II: pupils equal, round and reactive to light, visual fields intact, fundi unremarkable. CN III, IV, VI:  full range of motion, no nystagmus, no ptosis CN V: facial sensation intact CN VII: upper and lower face symmetric CN VIII: hearing intact to finger rub CN IX, X: gag intact, uvula midline CN XI: sternocleidomastoid and trapezius muscles intact CN XII: tongue midline Bulk & Tone: normal, no cogwheeling, no fasciculations. Motor: 5/5 throughout with no pronator drift. Sensation: intact to light touch, cold, pin, vibration and joint position sense.  No extinction to double simultaneous stimulation.  Romberg test negative Deep Tendon Reflexes: +1 throughout, no ankle clonus Plantar responses: downgoing bilaterally Cerebellar: no incoordination on finger to nose testing Gait: slow and cautious, slightly wide-based, no ataxia Tremor: none  IMPRESSION: This is a pleasant 82 year old right-handed woman with a history of hypertension, hyperlipidemia, presenting for evaluation of dementia. Her neurological exam is non-focal. MMSE today is 14/30, indicating moderate dementia, likely Alzheimer's type. Head CT without contrast will be ordered to assess for underlying structural abnormality. She is currently taking Aricept, continue daily Aricept. She was given a starter pack for Namenda XR, side effects and expectations from the medication were discussed with the patient and her friend. I discussed that home safety and long-term planning is most important at this point. I strongly recommended 24/7, and agree with moving to Memory Care. Her friend requested I speak to her POA who is out of state, I will update family on today's visit. She will follow-up in 1 year or earlier if needed.   Thank you for allowing me to participate in the care of this patient. Please do not hesitate to call for any questions or concerns.   Ellouise Newer, M.D.  CC: Dr. Ronnald Ramp

## 2015-08-29 ENCOUNTER — Telehealth: Payer: Self-pay | Admitting: Neurology

## 2015-08-29 DIAGNOSIS — F0390 Unspecified dementia without behavioral disturbance: Secondary | ICD-10-CM | POA: Insufficient documentation

## 2015-08-29 DIAGNOSIS — F03B0 Unspecified dementia, moderate, without behavioral disturbance, psychotic disturbance, mood disturbance, and anxiety: Secondary | ICD-10-CM | POA: Insufficient documentation

## 2015-08-29 NOTE — Telephone Encounter (Signed)
Tried calling back, left VM

## 2015-08-29 NOTE — Telephone Encounter (Signed)
Left VM on [name]'s phone, listed as POA, to discuss diagnosis and recommendations for [name].

## 2015-08-30 NOTE — Telephone Encounter (Signed)
Left VM for [name] to call and leave best time and number to call her back at.

## 2015-09-05 ENCOUNTER — Ambulatory Visit
Admission: RE | Admit: 2015-09-05 | Discharge: 2015-09-05 | Disposition: A | Discharge status: Home / Self Care | Payer: Medicare Other | Source: Ambulatory Visit | Attending: Neurology | Admitting: Neurology

## 2015-09-05 NOTE — Telephone Encounter (Signed)
[name], she is requesting if we can send my note to her, address is [address] [place], [place] [address]. Thanks

## 2015-09-05 NOTE — Telephone Encounter (Signed)
Please review

## 2015-09-05 NOTE — Telephone Encounter (Signed)
Please call [name] before 4 about patient medication [phone]

## 2015-09-05 NOTE — Telephone Encounter (Signed)
OV note mailed.

## 2015-09-05 NOTE — Telephone Encounter (Signed)
Discussed diagnosis of moderate dementia with [name]. They have been having financial constraints with getting 24/7 care, but are actively looking into an assisted living facility that provides 24/7 care. Memory care facilities are too expensive. She is concerned about starting the Namenda at this time because right now Laylia does not have 24/7 care to monitor if she has any dizziness/problems with starting Namenda. Discussed expectations from Namenda, this can be started once she is in assisted living. But stressed the importance that regardless of starting medication or not, she should be getting 24/7 care, which she expressed understanding of.

## 2015-09-12 ENCOUNTER — Telehealth: Payer: Self-pay | Admitting: Neurology

## 2015-09-12 NOTE — Telephone Encounter (Signed)
She returned my call. States she hasn't received the ov note that I mailed to her from patient's office visit. Ov note was mailed out on Feb. 1st, which she should've received by now. I told her that I would re-send note and it would go out with our mail on tomorrow.

## 2015-09-12 NOTE — Telephone Encounter (Signed)
Pt daughter Barbette Merino needs to talk to someone about a letter please call [phone]

## 2015-09-12 NOTE — Telephone Encounter (Signed)
Lmovm to rtn my call. 

## 2015-09-19 ENCOUNTER — Telehealth: Payer: Self-pay | Admitting: *Deleted

## 2015-09-19 DIAGNOSIS — K219 Gastro-esophageal reflux disease without esophagitis: Secondary | ICD-10-CM

## 2015-09-19 DIAGNOSIS — I2510 Atherosclerotic heart disease of native coronary artery without angina pectoris: Secondary | ICD-10-CM

## 2015-09-19 DIAGNOSIS — I10 Essential (primary) hypertension: Secondary | ICD-10-CM

## 2015-09-19 MED ORDER — METOPROLOL SUCCINATE ER 50 MG PO TB24
50.0000 mg | ORAL_TABLET | Freq: Every day | ORAL | Status: DC
Start: 2015-09-19 — End: 2016-01-25

## 2015-09-19 MED ORDER — ESOMEPRAZOLE MAGNESIUM 40 MG PO CPDR: 40.0000 mg | DELAYED_RELEASE_CAPSULE | Freq: Every day | ORAL | Status: DC

## 2015-09-19 MED ORDER — FELODIPINE ER 5 MG PO TB24: ORAL_TABLET | ORAL | Status: DC

## 2015-09-19 MED ORDER — SERTRALINE HCL 50 MG PO TABS: 50.0000 mg | ORAL_TABLET | Freq: Every day | ORAL | Status: DC

## 2015-09-19 MED ORDER — ATORVASTATIN CALCIUM 10 MG PO TABS: 10.0000 mg | ORAL_TABLET | Freq: Every day | ORAL | Status: DC

## 2015-09-19 NOTE — Telephone Encounter (Signed)
Left msg on triage needing refills on all meds. Sent in 30 day to [facility] until appt w/MD on 10/17/15...Raechel Chute

## 2015-10-17 ENCOUNTER — Ambulatory Visit (INDEPENDENT_AMBULATORY_CARE_PROVIDER_SITE_OTHER): Payer: Medicare Other | Admitting: Internal Medicine

## 2015-10-17 ENCOUNTER — Encounter: Payer: Self-pay | Admitting: Internal Medicine

## 2015-10-17 ENCOUNTER — Other Ambulatory Visit (INDEPENDENT_AMBULATORY_CARE_PROVIDER_SITE_OTHER): Payer: Medicare Other

## 2015-10-17 VITALS — BP 140/68 | HR 58 | Temp 97.6°F | Resp 16 | Ht 61.0 in | Wt 142.0 lb

## 2015-10-17 DIAGNOSIS — R001 Bradycardia, unspecified: Secondary | ICD-10-CM | POA: Diagnosis not present

## 2015-10-17 DIAGNOSIS — Z0000 Encounter for general adult medical examination without abnormal findings: Secondary | ICD-10-CM

## 2015-10-17 DIAGNOSIS — E785 Hyperlipidemia, unspecified: Secondary | ICD-10-CM

## 2015-10-17 DIAGNOSIS — I10 Essential (primary) hypertension: Secondary | ICD-10-CM

## 2015-10-17 DIAGNOSIS — I2510 Atherosclerotic heart disease of native coronary artery without angina pectoris: Secondary | ICD-10-CM | POA: Diagnosis not present

## 2015-10-17 LAB — CBC WITH DIFFERENTIAL/PLATELET
BASOS PCT: 0.3 % (ref 0.0–3.0)
Basophils Absolute: 0 10*3/uL (ref 0.0–0.1)
EOS PCT: 0.7 % (ref 0.0–5.0)
Eosinophils Absolute: 0 10*3/uL (ref 0.0–0.7)
HEMATOCRIT: 45.2 % (ref 36.0–46.0)
HEMOGLOBIN: 14.4 g/dL (ref 12.0–15.0)
LYMPHS PCT: 18.6 % (ref 12.0–46.0)
Lymphs Abs: 1.3 10*3/uL (ref 0.7–4.0)
MCHC: 32 g/dL (ref 30.0–36.0)
MCV: 87.6 fl (ref 78.0–100.0)
Monocytes Absolute: 0.6 10*3/uL (ref 0.1–1.0)
Monocytes Relative: 8.6 % (ref 3.0–12.0)
Neutro Abs: 4.9 10*3/uL (ref 1.4–7.7)
Neutrophils Relative %: 71.8 % (ref 43.0–77.0)
Platelets: 301 10*3/uL (ref 150.0–400.0)
RBC: 5.16 Mil/uL — ABNORMAL HIGH (ref 3.87–5.11)
RDW: 14.9 % (ref 11.5–15.5)
WBC: 6.8 10*3/uL (ref 4.0–10.5)

## 2015-10-17 LAB — COMPREHENSIVE METABOLIC PANEL
ALBUMIN: 4.2 g/dL (ref 3.5–5.2)
ALT: 11 U/L (ref 0–35)
AST: 24 U/L (ref 0–37)
Alkaline Phosphatase: 62 U/L (ref 39–117)
BUN: 17 mg/dL (ref 6–23)
CALCIUM: 10.4 mg/dL (ref 8.4–10.5)
CHLORIDE: 107 meq/L (ref 96–112)
CO2: 23 mEq/L (ref 19–32)
Creatinine, Ser: 0.94 mg/dL (ref 0.40–1.20)
GFR: 72.62 mL/min (ref >60.00)
Glucose, Bld: 112 mg/dL — ABNORMAL HIGH (ref 70–99)
POTASSIUM: 4.2 meq/L (ref 3.5–5.1)
SODIUM: 145 meq/L (ref 135–145)
Total Bilirubin: 1 mg/dL (ref 0.2–1.2)
Total Protein: 8 g/dL (ref 6.0–8.3)

## 2015-10-17 LAB — LIPID PANEL
CHOL/HDL RATIO: 3
Cholesterol: 219 mg/dL — ABNORMAL HIGH (ref 0–200)
HDL: 70 mg/dL (ref >39.00)
LDL Cholesterol: 131 mg/dL — ABNORMAL HIGH (ref 0–99)
NonHDL: 148.51
TRIGLYCERIDES: 88 mg/dL (ref 0.0–149.0)
VLDL: 17.6 mg/dL (ref 0.0–40.0)

## 2015-10-17 LAB — TSH: TSH: 0.57 u[IU]/mL (ref 0.35–4.50)

## 2015-10-17 NOTE — Progress Notes (Signed)
Pre visit review using our clinic review tool, if applicable. No additional management support is needed unless otherwise documented below in the visit note. 

## 2015-10-17 NOTE — Progress Notes (Signed)
Subjective:  Patient ID: [name], female    DOB: 1927-10-08  Age: 82 y.o. MRN: [id number]  CC: Hypertension; Hyperlipidemia; and Annual Exam   HPI [name] presents for a CPX/AWV.  She tells me that her blood pressure iha been well controlled and she denies any symptoms related to blood pressure such as headache, blurred vision, chest pain, shortness of breath, or edema. She is tolerating the calcium channel blocker and beta blocker with no side effects.  She is doing well on the statin for hypercholesterolemia. Her last lipid panel was done over a year ago and she had achieved her LDL goal.  Outpatient Prescriptions Prior to Visit  Medication Sig Dispense Refill  . aspirin 81 MG tablet Take 81 mg by mouth daily.    [name] atorvastatin (LIPITOR) 10 MG tablet Take 1 tablet (10 mg total) by mouth daily at 6 PM. Must keep 10/17/15 appt for future refills 30 tablet 0  . COMBIGAN 0.2-0.5 % ophthalmic solution 1 drop every 12 (twelve) hours.     [name] donepezil (ARICEPT) 5 MG tablet Take 5 mg by mouth.    . dorzolamide-timolol (COSOPT) 22.3-6.8 MG/ML ophthalmic solution Place 1 drop into both eyes Three times a day.    . esomeprazole (NEXIUM) 40 MG capsule Take 1 capsule (40 mg total) by mouth daily. Must keep 10/17/15 appt for future refills 30 capsule 0  . feeding supplement (BOOST HIGH PROTEIN) LIQD Take 237 mLs by mouth 2 (two) times daily between meals. 237 mL 11  . felodipine (PLENDIL) 5 MG 24 hr tablet TAKE 1 TABLET (5 MG TOTAL) BY MOUTH DAILY. Must keep 10/17/15 appt for future refills 30 tablet 3  . lactose free nutrition (BOOST) LIQD 1 drink 2-3 times daily    . meloxicam (MOBIC) 7.5 MG tablet Take 1 tablet (7.5 mg total) by mouth daily. 30 tablet 11  . memantine (NAMENDA XR) 28 MG CP24 24 hr capsule After you finish starter pack, start taking 1 tablet daily 30 capsule 11  . metoprolol succinate (TOPROL-XL) 50 MG 24 hr tablet Take 1 tablet (50 mg total) by mouth daily. Take with  or immediately following a meal. Must keep 10/17/15 appt for refills 30 tablet 0  . Multiple Vitamins-Minerals (CENTRUM SILVER ULTRA WOMENS PO) Take 1 tablet by mouth daily.    . sertraline (ZOLOFT) 50 MG tablet Take 1 tablet (50 mg total) by mouth daily. Must keep 10/17/15 appt for refills 30 tablet 0  . Memantine HCl ER (NAMENDA XR TITRATION PACK) 7 & 14 & 21 &28 MG CP24 Start Namenda XR starter pack 30 capsule 0   No facility-administered medications prior to visit.    ROS Review of Systems  Constitutional: Negative.  Negative for fever, chills, diaphoresis, appetite change and fatigue.  HENT: Negative.   Eyes: Negative.  Negative for photophobia and visual disturbance.  Respiratory: Negative.  Negative for cough, choking, chest tightness, shortness of breath and stridor.   Cardiovascular: Negative.  Negative for chest pain, palpitations and leg swelling.  Gastrointestinal: Negative.  Negative for nausea, vomiting, abdominal pain, diarrhea, constipation and blood in stool.  Endocrine: Negative.   Genitourinary: Negative.   Musculoskeletal: Negative.  Negative for myalgias, back pain, joint swelling and arthralgias.  Skin: Negative.  Negative for color change and rash.  Allergic/Immunologic: Negative.   Neurological: Negative.  Negative for dizziness, tremors, weakness, light-headedness, numbness and headaches.  Hematological: Negative.  Negative for adenopathy. Does not bruise/bleed easily.  Psychiatric/Behavioral: Positive for  confusion, sleep disturbance and decreased concentration. Negative for suicidal ideas, behavioral problems, dysphoric mood and agitation. The patient is not nervous/anxious.     Objective:  BP 140/68 mmHg  Pulse 58  Temp(Src) 97.6 F (36.4 C) (Oral)  Resp 16  Ht 5\' 1"  (1.549 m)  Wt 142 lb (64.411 kg)  BMI 26.84 kg/m2  SpO2 96%  BP Readings from Last 3 Encounters:  10/17/15 140/68  08/25/15 150/82  07/13/15 148/88    Wt Readings from Last 3  Encounters:  10/17/15 142 lb (64.411 kg)  08/25/15 141 lb (63.957 kg)  07/13/15 138 lb (62.596 kg)    Physical Exam  Constitutional: She is oriented to person, place, and time. She appears well-developed and well-nourished. No distress.  HENT:  Head: Normocephalic and atraumatic.  Mouth/Throat: Oropharynx is clear and moist. No oropharyngeal exudate.  Eyes: Conjunctivae are normal. Right eye exhibits no discharge. Left eye exhibits no discharge. No scleral icterus.  Neck: Normal range of motion. Neck supple. No JVD present. No tracheal deviation present. No thyromegaly present.  Cardiovascular: Normal rate, regular rhythm, normal heart sounds and intact distal pulses.  Frequent extrasystoles are present. Exam reveals no gallop and no friction rub.   No murmur heard. Pulses:      Carotid pulses are 1+ on the right side, and 1+ on the left side.      Radial pulses are 1+ on the right side, and 1+ on the left side.       Femoral pulses are 1+ on the right side, and 1+ on the left side.      Popliteal pulses are 1+ on the right side, and 1+ on the left side.       Dorsalis pedis pulses are 1+ on the right side, and 1+ on the left side.       Posterior tibial pulses are 1+ on the right side, and 1+ on the left side.  Pulmonary/Chest: Effort normal and breath sounds normal. No stridor. No respiratory distress. She has no wheezes. She has no rales. She exhibits no tenderness.  Abdominal: Soft. Bowel sounds are normal. She exhibits no distension and no mass. There is no tenderness. There is no rebound and no guarding.  Genitourinary:  Genitourinary, rectal, and breast exams were deferred at her request.  Musculoskeletal: Normal range of motion. She exhibits no edema or tenderness.  Lymphadenopathy:    She has no cervical adenopathy.  Neurological: She is oriented to person, place, and time.  Skin: Skin is warm and dry. No rash noted. She is not diaphoretic. No erythema. No pallor.    Psychiatric: Judgment and thought content normal. Her mood appears not anxious. Her affect is not angry. Her speech is delayed and tangential. Her speech is not rapid and/or pressured and not slurred. She is slowed and withdrawn. Cognition and memory are impaired. She does not exhibit a depressed mood. She is communicative. She exhibits abnormal recent memory and abnormal remote memory. She is inattentive.  Vitals reviewed.   Lab Results  Component Value Date   WBC 6.8 10/17/2015   HGB 14.4 10/17/2015   HCT 45.2 10/17/2015   PLT 301.0 10/17/2015   GLUCOSE 112* 10/17/2015   CHOL 219* 10/17/2015   TRIG 88.0 10/17/2015   HDL 70.00 10/17/2015   LDLDIRECT 140.3 11/11/2011   LDLCALC 131* 10/17/2015   ALT 11 10/17/2015   AST 24 10/17/2015   NA 145 10/17/2015   K 4.2 10/17/2015   CL 107 10/17/2015  CREATININE 0.94 10/17/2015   BUN 17 10/17/2015   CO2 23 10/17/2015   TSH 0.57 10/17/2015   INR 1.1 02/09/2008    Ct Head Wo Contrast  09/05/2015  CLINICAL DATA:  82 year old hypertensive female with moderate dementia worsening over the past year. Initial encounter. EXAM: CT HEAD WITHOUT CONTRAST TECHNIQUE: Contiguous axial images were obtained from the base of the skull through the vertex without intravenous contrast. COMPARISON:  No comparison head CT or brain MR. FINDINGS: No intracranial hemorrhage or CT evidence of large acute infarct. There is slightly dense appearance of right middle cerebral artery brain which is probably an incidental finding. Moderate global atrophy. Ventricular prominence felt to be related to central atrophy rather than hydrocephalus. Moderate small vessel disease changes. No intracranial mass lesion noted on this unenhanced exam. Vascular calcifications. Hyperostosis frontalis interna incidentally noted. Mastoid air cells and middle ear cavities are clear. Orbital structures not visualized. IMPRESSION: No intracranial hemorrhage or CT evidence of large acute infarct.  Moderate small vessel disease changes. Moderate global atrophy. Ventricular prominence felt to be related to central atrophy rather than hydrocephalus. Electronically Signed   By: Lacy Duverney M.D.   On: 09/05/2015 13:44    Assessment & Plan:   Megham was seen today for hypertension, hyperlipidemia and annual exam.  Diagnoses and all orders for this visit:  Atherosclerosis of native coronary artery of native heart without angina pectoris- she has had no recent episodes of chest pain or shortness of breath, will continue risk factor modification with the use of statins, blood pressure control, and an aspirin a day. -     Lipid panel; Future  Essential hypertension- Her blood pressure is well-controlled, electrolytes and renal function are stable. -     Comprehensive metabolic panel; Future -     CBC with Differential/Platelet; Future  Hyperlipidemia with target LDL less than 100- she has not quite achieved her LDL goal, I will increase the dose of her statin from 10 mg a day to 40 mg a day. -     Lipid panel; Future -     TSH; Future -     Comprehensive metabolic panel; Future  Bradycardia- she has mild bradycardia related to the calcium channel blocker and beta blocker therapy, she is asymptomatic with respect to this so we'll not evaluate any further. She will let me know if she develops any palpitations, shortness of breath, syncope, dizziness, or lightheadedness. -     EKG 12-Lead   I have discontinued Ms. Westergaard's Memantine HCl ER. I am also having her maintain her dorzolamide-timolol, Multiple Vitamins-Minerals (CENTRUM SILVER ULTRA WOMENS PO), lactose free nutrition, COMBIGAN, aspirin, feeding supplement, meloxicam, donepezil, memantine, atorvastatin, esomeprazole, felodipine, metoprolol succinate, and sertraline.  No orders of the defined types were placed in this encounter.   See AVS for instructions about healthy living and anticipatory guidance.  Follow-up: Return in about 6  months (around 04/18/2016).  Sanda Linger, MD

## 2015-10-17 NOTE — Patient Instructions (Signed)
Preventive Care for Adults, Female A healthy lifestyle and preventive care can promote health and wellness. Preventive health guidelines for women include the following key practices.  A routine yearly physical is a good way to check with your health care provider about your health and preventive screening. It is a chance to share any concerns and updates on your health and to receive a thorough exam.  Visit your dentist for a routine exam and preventive care every 6 months. Brush your teeth twice a day and floss once a day. Good oral hygiene prevents tooth decay and gum disease.  The frequency of eye exams is based on your age, health, family medical history, use of contact lenses, and other factors. Follow your health care provider's recommendations for frequency of eye exams.  Eat a healthy diet. Foods like vegetables, fruits, whole grains, low-fat dairy products, and lean protein foods contain the nutrients you need without too many calories. Decrease your intake of foods high in solid fats, added sugars, and salt. Eat the right amount of calories for you.Get information about a proper diet from your health care provider, if necessary.  Regular physical exercise is one of the most important things you can do for your health. Most adults should get at least 150 minutes of moderate-intensity exercise (any activity that increases your heart rate and causes you to sweat) each week. In addition, most adults need muscle-strengthening exercises on 2 or more days a week.  Maintain a healthy weight. The body mass index (BMI) is a screening tool to identify possible weight problems. It provides an estimate of body fat based on height and weight. Your health care provider can find your BMI and can help you achieve or maintain a healthy weight.For adults 20 years and older:  A BMI below 18.5 is considered underweight.  A BMI of 18.5 to 24.9 is normal.  A BMI of 25 to 29.9 is considered overweight.  A  BMI of 30 and above is considered obese.  Maintain normal blood lipids and cholesterol levels by exercising and minimizing your intake of saturated fat. Eat a balanced diet with plenty of fruit and vegetables. Blood tests for lipids and cholesterol should begin at age 45 and be repeated every 5 years. If your lipid or cholesterol levels are high, you are over 50, or you are at high risk for heart disease, you may need your cholesterol levels checked more frequently.Ongoing high lipid and cholesterol levels should be treated with medicines if diet and exercise are not working.  If you smoke, find out from your health care provider how to quit. If you do not use tobacco, do not start.  Lung cancer screening is recommended for adults aged 45-80 years who are at high risk for developing lung cancer because of a history of smoking. A yearly low-dose CT scan of the lungs is recommended for people who have at least a 30-pack-year history of smoking and are a current smoker or have quit within the past 15 years. A pack year of smoking is smoking an average of 1 pack of cigarettes a day for 1 year (for example: 1 pack a day for 30 years or 2 packs a day for 15 years). Yearly screening should continue until the smoker has stopped smoking for at least 15 years. Yearly screening should be stopped for people who develop a health problem that would prevent them from having lung cancer treatment.  If you are pregnant, do not drink alcohol. If you are  breastfeeding, be very cautious about drinking alcohol. If you are not pregnant and choose to drink alcohol, do not have more than 1 drink per day. One drink is considered to be 12 ounces (355 mL) of beer, 5 ounces (148 mL) of wine, or 1.5 ounces (44 mL) of liquor.  Avoid use of street drugs. Do not share needles with anyone. Ask for help if you need support or instructions about stopping the use of drugs.  High blood pressure causes heart disease and increases the risk  of stroke. Your blood pressure should be checked at least every 1 to 2 years. Ongoing high blood pressure should be treated with medicines if weight loss and exercise do not work.  If you are 55-79 years old, ask your health care provider if you should take aspirin to prevent strokes.  Diabetes screening is done by taking a blood sample to check your blood glucose level after you have not eaten for a certain period of time (fasting). If you are not overweight and you do not have risk factors for diabetes, you should be screened once every 3 years starting at age 45. If you are overweight or obese and you are 40-70 years of age, you should be screened for diabetes every year as part of your cardiovascular risk assessment.  Breast cancer screening is essential preventive care for women. You should practice "breast self-awareness." This means understanding the normal appearance and feel of your breasts and may include breast self-examination. Any changes detected, no matter how small, should be reported to a health care provider. Women in their 20s and 30s should have a clinical breast exam (CBE) by a health care provider as part of a regular health exam every 1 to 3 years. After age 40, women should have a CBE every year. Starting at age 40, women should consider having a mammogram (breast X-ray test) every year. Women who have a family history of breast cancer should talk to their health care provider about genetic screening. Women at a high risk of breast cancer should talk to their health care providers about having an MRI and a mammogram every year.  Breast cancer gene (BRCA)-related cancer risk assessment is recommended for women who have family members with BRCA-related cancers. BRCA-related cancers include breast, ovarian, tubal, and peritoneal cancers. Having family members with these cancers may be associated with an increased risk for harmful changes (mutations) in the breast cancer genes BRCA1 and  BRCA2. Results of the assessment will determine the need for genetic counseling and BRCA1 and BRCA2 testing.  Your health care provider may recommend that you be screened regularly for cancer of the pelvic organs (ovaries, uterus, and vagina). This screening involves a pelvic examination, including checking for microscopic changes to the surface of your cervix (Pap test). You may be encouraged to have this screening done every 3 years, beginning at age 21.  For women ages 30-65, health care providers may recommend pelvic exams and Pap testing every 3 years, or they may recommend the Pap and pelvic exam, combined with testing for human papilloma virus (HPV), every 5 years. Some types of HPV increase your risk of cervical cancer. Testing for HPV may also be done on women of any age with unclear Pap test results.  Other health care providers may not recommend any screening for nonpregnant women who are considered low risk for pelvic cancer and who do not have symptoms. Ask your health care provider if a screening pelvic exam is right for   you.  If you have had past treatment for cervical cancer or a condition that could lead to cancer, you need Pap tests and screening for cancer for at least 20 years after your treatment. If Pap tests have been discontinued, your risk factors (such as having a new sexual partner) need to be reassessed to determine if screening should resume. Some women have medical problems that increase the chance of getting cervical cancer. In these cases, your health care provider may recommend more frequent screening and Pap tests.  Colorectal cancer can be detected and often prevented. Most routine colorectal cancer screening begins at the age of 50 years and continues through age 75 years. However, your health care provider may recommend screening at an earlier age if you have risk factors for colon cancer. On a yearly basis, your health care provider may provide home test kits to check  for hidden blood in the stool. Use of a small camera at the end of a tube, to directly examine the colon (sigmoidoscopy or colonoscopy), can detect the earliest forms of colorectal cancer. Talk to your health care provider about this at age 50, when routine screening begins. Direct exam of the colon should be repeated every 5-10 years through age 75 years, unless early forms of precancerous polyps or small growths are found.  People who are at an increased risk for hepatitis B should be screened for this virus. You are considered at high risk for hepatitis B if:  You were born in a country where hepatitis B occurs often. Talk with your health care provider about which countries are considered high risk.  Your parents were born in a high-risk country and you have not received a shot to protect against hepatitis B (hepatitis B vaccine).  You have HIV or AIDS.  You use needles to inject street drugs.  You live with, or have sex with, someone who has hepatitis B.  You get hemodialysis treatment.  You take certain medicines for conditions like cancer, organ transplantation, and autoimmune conditions.  Hepatitis C blood testing is recommended for all people born from 1945 through 1965 and any individual with known risks for hepatitis C.  Practice safe sex. Use condoms and avoid high-risk sexual practices to reduce the spread of sexually transmitted infections (STIs). STIs include gonorrhea, chlamydia, syphilis, trichomonas, herpes, HPV, and human immunodeficiency virus (HIV). Herpes, HIV, and HPV are viral illnesses that have no cure. They can result in disability, cancer, and death.  You should be screened for sexually transmitted illnesses (STIs) including gonorrhea and chlamydia if:  You are sexually active and are younger than 24 years.  You are older than 24 years and your health care provider tells you that you are at risk for this type of infection.  Your sexual activity has changed  since you were last screened and you are at an increased risk for chlamydia or gonorrhea. Ask your health care provider if you are at risk.  If you are at risk of being infected with HIV, it is recommended that you take a prescription medicine daily to prevent HIV infection. This is called preexposure prophylaxis (PrEP). You are considered at risk if:  You are sexually active and do not regularly use condoms or know the HIV status of your partner(s).  You take drugs by injection.  You are sexually active with a partner who has HIV.  Talk with your health care provider about whether you are at high risk of being infected with HIV. If   you choose to begin PrEP, you should first be tested for HIV. You should then be tested every 3 months for as long as you are taking PrEP.  Osteoporosis is a disease in which the bones lose minerals and strength with aging. This can result in serious bone fractures or breaks. The risk of osteoporosis can be identified using a bone density scan. Women ages 67 years and over and women at risk for fractures or osteoporosis should discuss screening with their health care providers. Ask your health care provider whether you should take a calcium supplement or vitamin D to reduce the rate of osteoporosis.  Menopause can be associated with physical symptoms and risks. Hormone replacement therapy is available to decrease symptoms and risks. You should talk to your health care provider about whether hormone replacement therapy is right for you.  Use sunscreen. Apply sunscreen liberally and repeatedly throughout the day. You should seek shade when your shadow is shorter than you. Protect yourself by wearing long sleeves, pants, a wide-brimmed hat, and sunglasses year round, whenever you are outdoors.  Once a month, do a whole body skin exam, using a mirror to look at the skin on your back. Tell your health care provider of new moles, moles that have irregular borders, moles that  are larger than a pencil eraser, or moles that have changed in shape or color.  Stay current with required vaccines (immunizations).  Influenza vaccine. All adults should be immunized every year.  Tetanus, diphtheria, and acellular pertussis (Td, Tdap) vaccine. Pregnant women should receive 1 dose of Tdap vaccine during each pregnancy. The dose should be obtained regardless of the length of time since the last dose. Immunization is preferred during the 27th-36th week of gestation. An adult who has not previously received Tdap or who does not know her vaccine status should receive 1 dose of Tdap. This initial dose should be followed by tetanus and diphtheria toxoids (Td) booster doses every 10 years. Adults with an unknown or incomplete history of completing a 3-dose immunization series with Td-containing vaccines should begin or complete a primary immunization series including a Tdap dose. Adults should receive a Td booster every 10 years.  Varicella vaccine. An adult without evidence of immunity to varicella should receive 2 doses or a second dose if she has previously received 1 dose. Pregnant females who do not have evidence of immunity should receive the first dose after pregnancy. This first dose should be obtained before leaving the health care facility. The second dose should be obtained 4-8 weeks after the first dose.  Human papillomavirus (HPV) vaccine. Females aged 13-26 years who have not received the vaccine previously should obtain the 3-dose series. The vaccine is not recommended for use in pregnant females. However, pregnancy testing is not needed before receiving a dose. If a female is found to be pregnant after receiving a dose, no treatment is needed. In that case, the remaining doses should be delayed until after the pregnancy. Immunization is recommended for any person with an immunocompromised condition through the age of 61 years if she did not get any or all doses earlier. During the  3-dose series, the second dose should be obtained 4-8 weeks after the first dose. The third dose should be obtained 24 weeks after the first dose and 16 weeks after the second dose.  Zoster vaccine. One dose is recommended for adults aged 30 years or older unless certain conditions are present.  Measles, mumps, and rubella (MMR) vaccine. Adults born  before 1957 generally are considered immune to measles and mumps. Adults born in 1957 or later should have 1 or more doses of MMR vaccine unless there is a contraindication to the vaccine or there is laboratory evidence of immunity to each of the three diseases. A routine second dose of MMR vaccine should be obtained at least 28 days after the first dose for students attending postsecondary schools, health care workers, or international travelers. People who received inactivated measles vaccine or an unknown type of measles vaccine during 1963-1967 should receive 2 doses of MMR vaccine. People who received inactivated mumps vaccine or an unknown type of mumps vaccine before 1979 and are at high risk for mumps infection should consider immunization with 2 doses of MMR vaccine. For females of childbearing age, rubella immunity should be determined. If there is no evidence of immunity, females who are not pregnant should be vaccinated. If there is no evidence of immunity, females who are pregnant should delay immunization until after pregnancy. Unvaccinated health care workers born before 1957 who lack laboratory evidence of measles, mumps, or rubella immunity or laboratory confirmation of disease should consider measles and mumps immunization with 2 doses of MMR vaccine or rubella immunization with 1 dose of MMR vaccine.  Pneumococcal 13-valent conjugate (PCV13) vaccine. When indicated, a person who is uncertain of his immunization history and has no record of immunization should receive the PCV13 vaccine. All adults 65 years of age and older should receive this  vaccine. An adult aged 19 years or older who has certain medical conditions and has not been previously immunized should receive 1 dose of PCV13 vaccine. This PCV13 should be followed with a dose of pneumococcal polysaccharide (PPSV23) vaccine. Adults who are at high risk for pneumococcal disease should obtain the PPSV23 vaccine at least 8 weeks after the dose of PCV13 vaccine. Adults older than 80 years of age who have normal immune system function should obtain the PPSV23 vaccine dose at least 1 year after the dose of PCV13 vaccine.  Pneumococcal polysaccharide (PPSV23) vaccine. When PCV13 is also indicated, PCV13 should be obtained first. All adults aged 65 years and older should be immunized. An adult younger than age 65 years who has certain medical conditions should be immunized. Any person who resides in a nursing home or long-term care facility should be immunized. An adult smoker should be immunized. People with an immunocompromised condition and certain other conditions should receive both PCV13 and PPSV23 vaccines. People with human immunodeficiency virus (HIV) infection should be immunized as soon as possible after diagnosis. Immunization during chemotherapy or radiation therapy should be avoided. Routine use of PPSV23 vaccine is not recommended for American Indians, [place] Natives, or people younger than 65 years unless there are medical conditions that require PPSV23 vaccine. When indicated, people who have unknown immunization and have no record of immunization should receive PPSV23 vaccine. One-time revaccination 5 years after the first dose of PPSV23 is recommended for people aged 19-64 years who have chronic kidney failure, nephrotic syndrome, asplenia, or immunocompromised conditions. People who received 1-2 doses of PPSV23 before age 65 years should receive another dose of PPSV23 vaccine at age 65 years or later if at least 5 years have passed since the previous dose. Doses of PPSV23 are not  needed for people immunized with PPSV23 at or after age 65 years.  Meningococcal vaccine. Adults with asplenia or persistent complement component deficiencies should receive 2 doses of quadrivalent meningococcal conjugate (MenACWY-D) vaccine. The doses should be obtained   at least 2 months apart. Microbiologists working with certain meningococcal bacteria, [place] recruits, people at risk during an outbreak, and people who travel to or live in countries with a high rate of meningitis should be immunized. A first-year college student up through age 34 years who is living in a residence hall should receive a dose if she did not receive a dose on or after her 16th birthday. Adults who have certain high-risk conditions should receive one or more doses of vaccine.  Hepatitis A vaccine. Adults who wish to be protected from this disease, have certain high-risk conditions, work with hepatitis A-infected animals, work in hepatitis A research labs, or travel to or work in countries with a high rate of hepatitis A should be immunized. Adults who were previously unvaccinated and who anticipate close contact with an international adoptee during the first 60 days after arrival in the [place] from a country with a high rate of hepatitis A should be immunized.  Hepatitis B vaccine. Adults who wish to be protected from this disease, have certain high-risk conditions, may be exposed to blood or other infectious body fluids, are household contacts or sex partners of hepatitis B positive people, are clients or workers in certain care facilities, or travel to or work in countries with a high rate of hepatitis B should be immunized.  Haemophilus influenzae type b (Hib) vaccine. A previously unvaccinated person with asplenia or sickle cell disease or having a scheduled splenectomy should receive 1 dose of Hib vaccine. Regardless of previous immunization, a recipient of a hematopoietic stem cell transplant should receive a  3-dose series 6-12 months after her successful transplant. Hib vaccine is not recommended for adults with HIV infection. Preventive Services / Frequency Ages 35 to 4 years  Blood pressure check.** / Every 3-5 years.  Lipid and cholesterol check.** / Every 5 years beginning at age 60.  Clinical breast exam.** / Every 3 years for women in their 71s and 10s.  BRCA-related cancer risk assessment.** / For women who have family members with a BRCA-related cancer (breast, ovarian, tubal, or peritoneal cancers).  Pap test.** / Every 2 years from ages 76 through 26. Every 3 years starting at age 61 through age 76 or 93 with a history of 3 consecutive normal Pap tests.  HPV screening.** / Every 3 years from ages 37 through ages 60 to 51 with a history of 3 consecutive normal Pap tests.  Hepatitis C blood test.** / For any individual with known risks for hepatitis C.  Skin self-exam. / Monthly.  Influenza vaccine. / Every year.  Tetanus, diphtheria, and acellular pertussis (Tdap, Td) vaccine.** / Consult your health care provider. Pregnant women should receive 1 dose of Tdap vaccine during each pregnancy. 1 dose of Td every 10 years.  Varicella vaccine.** / Consult your health care provider. Pregnant females who do not have evidence of immunity should receive the first dose after pregnancy.  HPV vaccine. / 3 doses over 6 months, if 93 and younger. The vaccine is not recommended for use in pregnant females. However, pregnancy testing is not needed before receiving a dose.  Measles, mumps, rubella (MMR) vaccine.** / You need at least 1 dose of MMR if you were born in 1957 or later. You may also need a 2nd dose. For females of childbearing age, rubella immunity should be determined. If there is no evidence of immunity, females who are not pregnant should be vaccinated. If there is no evidence of immunity, females who are  pregnant should delay immunization until after pregnancy.  Pneumococcal  13-valent conjugate (PCV13) vaccine.** / Consult your health care provider.  Pneumococcal polysaccharide (PPSV23) vaccine.** / 1 to 2 doses if you smoke cigarettes or if you have certain conditions.  Meningococcal vaccine.** / 1 dose if you are age 68 to 8 years and a Market researcher living in a residence hall, or have one of several medical conditions, you need to get vaccinated against meningococcal disease. You may also need additional booster doses.  Hepatitis A vaccine.** / Consult your health care provider.  Hepatitis B vaccine.** / Consult your health care provider.  Haemophilus influenzae type b (Hib) vaccine.** / Consult your health care provider. Ages 7 to 53 years  Blood pressure check.** / Every year.  Lipid and cholesterol check.** / Every 5 years beginning at age 25 years.  Lung cancer screening. / Every year if you are aged 11-80 years and have a 30-pack-year history of smoking and currently smoke or have quit within the past 15 years. Yearly screening is stopped once you have quit smoking for at least 15 years or develop a health problem that would prevent you from having lung cancer treatment.  Clinical breast exam.** / Every year after age 48 years.  BRCA-related cancer risk assessment.** / For women who have family members with a BRCA-related cancer (breast, ovarian, tubal, or peritoneal cancers).  Mammogram.** / Every year beginning at age 41 years and continuing for as long as you are in good health. Consult with your health care provider.  Pap test.** / Every 3 years starting at age 65 years through age 37 or 70 years with a history of 3 consecutive normal Pap tests.  HPV screening.** / Every 3 years from ages 72 years through ages 60 to 40 years with a history of 3 consecutive normal Pap tests.  Fecal occult blood test (FOBT) of stool. / Every year beginning at age 21 years and continuing until age 5 years. You may not need to do this test if you get  a colonoscopy every 10 years.  Flexible sigmoidoscopy or colonoscopy.** / Every 5 years for a flexible sigmoidoscopy or every 10 years for a colonoscopy beginning at age 35 years and continuing until age 48 years.  Hepatitis C blood test.** / For all people born from [birth year] through 1965 and any individual with known risks for hepatitis C.  Skin self-exam. / Monthly.  Influenza vaccine. / Every year.  Tetanus, diphtheria, and acellular pertussis (Tdap/Td) vaccine.** / Consult your health care provider. Pregnant women should receive 1 dose of Tdap vaccine during each pregnancy. 1 dose of Td every 10 years.  Varicella vaccine.** / Consult your health care provider. Pregnant females who do not have evidence of immunity should receive the first dose after pregnancy.  Zoster vaccine.** / 1 dose for adults aged 30 years or older.  Measles, mumps, rubella (MMR) vaccine.** / You need at least 1 dose of MMR if you were born in 1957 or later. You may also need a second dose. For females of childbearing age, rubella immunity should be determined. If there is no evidence of immunity, females who are not pregnant should be vaccinated. If there is no evidence of immunity, females who are pregnant should delay immunization until after pregnancy.  Pneumococcal 13-valent conjugate (PCV13) vaccine.** / Consult your health care provider.  Pneumococcal polysaccharide (PPSV23) vaccine.** / 1 to 2 doses if you smoke cigarettes or if you have certain conditions.  Meningococcal vaccine.** /  Consult your health care provider.  Hepatitis A vaccine.** / Consult your health care provider.  Hepatitis B vaccine.** / Consult your health care provider.  Haemophilus influenzae type b (Hib) vaccine.** / Consult your health care provider. Ages 64 years and over  Blood pressure check.** / Every year.  Lipid and cholesterol check.** / Every 5 years beginning at age 23 years.  Lung cancer screening. / Every year if you  are aged 16-80 years and have a 30-pack-year history of smoking and currently smoke or have quit within the past 15 years. Yearly screening is stopped once you have quit smoking for at least 15 years or develop a health problem that would prevent you from having lung cancer treatment.  Clinical breast exam.** / Every year after age 74 years.  BRCA-related cancer risk assessment.** / For women who have family members with a BRCA-related cancer (breast, ovarian, tubal, or peritoneal cancers).  Mammogram.** / Every year beginning at age 44 years and continuing for as long as you are in good health. Consult with your health care provider.  Pap test.** / Every 3 years starting at age 58 years through age 22 or 39 years with 3 consecutive normal Pap tests. Testing can be stopped between 65 and 70 years with 3 consecutive normal Pap tests and no abnormal Pap or HPV tests in the past 10 years.  HPV screening.** / Every 3 years from ages 64 years through ages 70 or 61 years with a history of 3 consecutive normal Pap tests. Testing can be stopped between 65 and 70 years with 3 consecutive normal Pap tests and no abnormal Pap or HPV tests in the past 10 years.  Fecal occult blood test (FOBT) of stool. / Every year beginning at age 40 years and continuing until age 27 years. You may not need to do this test if you get a colonoscopy every 10 years.  Flexible sigmoidoscopy or colonoscopy.** / Every 5 years for a flexible sigmoidoscopy or every 10 years for a colonoscopy beginning at age 7 years and continuing until age 32 years.  Hepatitis C blood test.** / For all people born from [birth year] through 1963 and any individual with known risks for hepatitis C.  Osteoporosis screening.** / A one-time screening for women ages 30 years and over and women at risk for fractures or osteoporosis.  Skin self-exam. / Monthly.  Influenza vaccine. / Every year.  Tetanus, diphtheria, and acellular pertussis (Tdap/Td)  vaccine.** / 1 dose of Td every 10 years.  Varicella vaccine.** / Consult your health care provider.  Zoster vaccine.** / 1 dose for adults aged 35 years or older.  Pneumococcal 13-valent conjugate (PCV13) vaccine.** / Consult your health care provider.  Pneumococcal polysaccharide (PPSV23) vaccine.** / 1 dose for all adults aged 46 years and older.  Meningococcal vaccine.** / Consult your health care provider.  Hepatitis A vaccine.** / Consult your health care provider.  Hepatitis B vaccine.** / Consult your health care provider.  Haemophilus influenzae type b (Hib) vaccine.** / Consult your health care provider. ** Family history and personal history of risk and conditions may change your health care provider's recommendations.   This information is not intended to replace advice given to you by your health care provider. Make sure you discuss any questions you have with your health care provider.   Document Released: 09/17/2001 Document Revised: 08/12/2014 Document Reviewed: 12/17/2010 Elsevier Interactive Patient Education [employer].

## 2015-10-18 MED ORDER — ATORVASTATIN CALCIUM 40 MG PO TABS: 40.0000 mg | ORAL_TABLET | Freq: Every day | ORAL | Status: DC

## 2015-10-18 NOTE — Assessment & Plan Note (Signed)

## 2015-11-07 ENCOUNTER — Telehealth: Payer: Self-pay

## 2015-11-07 NOTE — Telephone Encounter (Signed)
FL2 and financial attestation received, completed, and placed on MD's desk for signature at [facility] scheduled for 11/08/2015

## 2015-11-08 ENCOUNTER — Ambulatory Visit (INDEPENDENT_AMBULATORY_CARE_PROVIDER_SITE_OTHER): Payer: Medicare Other | Admitting: Internal Medicine

## 2015-11-08 ENCOUNTER — Encounter: Payer: Self-pay | Admitting: Internal Medicine

## 2015-11-08 VITALS — BP 140/82 | HR 76 | Temp 98.3°F | Resp 16 | Ht 61.0 in | Wt 144.0 lb

## 2015-11-08 DIAGNOSIS — I10 Essential (primary) hypertension: Secondary | ICD-10-CM | POA: Diagnosis not present

## 2015-11-08 NOTE — Patient Instructions (Signed)
Hypertension Hypertension, commonly called high blood pressure, is when the force of blood pumping through your arteries is too strong. Your arteries are the blood vessels that carry blood from your heart throughout your body. A blood pressure reading consists of a higher number over a lower number, such as 110/72. The higher number (systolic) is the pressure inside your arteries when your heart pumps. The lower number (diastolic) is the pressure inside your arteries when your heart relaxes. Ideally you want your blood pressure below 120/80. Hypertension forces your heart to work harder to pump blood. Your arteries may become narrow or stiff. Having untreated or uncontrolled hypertension can cause heart attack, stroke, kidney disease, and other problems. RISK FACTORS Some risk factors for high blood pressure are controllable. Others are not.  Risk factors you cannot control include:   Race. You may be at higher risk if you are African American.  Age. Risk increases with age.  Gender. Men are at higher risk than women before age 45 years. After age 65, women are at higher risk than men. Risk factors you can control include:  Not getting enough exercise or physical activity.  Being overweight.  Getting too much fat, sugar, calories, or salt in your diet.  Drinking too much alcohol. SIGNS AND SYMPTOMS Hypertension does not usually cause signs or symptoms. Extremely high blood pressure (hypertensive crisis) may cause headache, anxiety, shortness of breath, and nosebleed. DIAGNOSIS To check if you have hypertension, your health care provider will measure your blood pressure while you are seated, with your arm held at the level of your heart. It should be measured at least twice using the same arm. Certain conditions can cause a difference in blood pressure between your right and left arms. A blood pressure reading that is higher than normal on one occasion does not mean that you need treatment. If  it is not clear whether you have high blood pressure, you may be asked to return on a different day to have your blood pressure checked again. Or, you may be asked to monitor your blood pressure at home for 1 or more weeks. TREATMENT Treating high blood pressure includes making lifestyle changes and possibly taking medicine. Living a healthy lifestyle can help lower high blood pressure. You may need to change some of your habits. Lifestyle changes may include:  Following the DASH diet. This diet is high in fruits, vegetables, and whole grains. It is low in salt, red meat, and added sugars.  Keep your sodium intake below 2,300 mg per day.  Getting at least 30-45 minutes of aerobic exercise at least 4 times per week.  Losing weight if necessary.  Not smoking.  Limiting alcoholic beverages.  Learning ways to reduce stress. Your health care provider may prescribe medicine if lifestyle changes are not enough to get your blood pressure under control, and if one of the following is true:  You are 18-59 years of age and your systolic blood pressure is above 140.  You are 60 years of age or older, and your systolic blood pressure is above 150.  Your diastolic blood pressure is above 90.  You have diabetes, and your systolic blood pressure is over 140 or your diastolic blood pressure is over 90.  You have kidney disease and your blood pressure is above 140/90.  You have heart disease and your blood pressure is above 140/90. Your personal target blood pressure may vary depending on your medical conditions, your age, and other factors. HOME CARE INSTRUCTIONS    Have your blood pressure rechecked as directed by your health care provider.   Take medicines only as directed by your health care provider. Follow the directions carefully. Blood pressure medicines must be taken as prescribed. The medicine does not work as well when you skip doses. Skipping doses also puts you at risk for  problems.  Do not smoke.   Monitor your blood pressure at home as directed by your health care provider. SEEK MEDICAL CARE IF:   You think you are having a reaction to medicines taken.  You have recurrent headaches or feel dizzy.  You have swelling in your ankles.  You have trouble with your vision. SEEK IMMEDIATE MEDICAL CARE IF:  You develop a severe headache or confusion.  You have unusual weakness, numbness, or feel faint.  You have severe chest or abdominal pain.  You vomit repeatedly.  You have trouble breathing. MAKE SURE YOU:   Understand these instructions.  Will watch your condition.  Will get help right away if you are not doing well or get worse.   This information is not intended to replace advice given to you by your health care provider. Make sure you discuss any questions you have with your health care provider.   Document Released: 07/22/2005 Document Revised: 12/06/2014 Document Reviewed: 05/14/2013 Elsevier Interactive Patient Education 2016 Elsevier Inc.  

## 2015-11-08 NOTE — Progress Notes (Signed)
Pre visit review using our clinic review tool, if applicable. No additional management support is needed unless otherwise documented below in the visit note. 
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
## 2015-11-09 NOTE — Telephone Encounter (Signed)
MD declined to sign and generate letter regarding financial competence.  POA advised of same via detailed personal VM. FL2, medication list, and TB skin test result will be [facility]ed to [facility] ATTN De BlanchJim Stovall [phone].  [facility] as [facility]

## 2015-11-09 NOTE — Telephone Encounter (Signed)
POA was informed of below after returning my call.  She became immediately upset, raising her voice about Dr. Yetta BarreJones' decision and began threatening to switch pt's PCP.  She continued to inform me that "we" did not understand the full extent of pt's dementia, paranoia, and aggression.  I did advised her that pt was evaluated in office yesterday and perhaps she could make a ROV and accompany pt to further discuss with PCP.  She replied that she lives in [place] and that we are "trying to tie her hands" because she cannot come for a office visit.  She continued to angrily insist that Dr Yetta BarreJones' decision was incorrect and that she would find a PCP that understood what she needs. I advised POA that I would forward information to Dr Yetta BarreJones and call back if Dr Yetta BarreJones had any additional information regarding this request

## 2015-11-14 ENCOUNTER — Ambulatory Visit (INDEPENDENT_AMBULATORY_CARE_PROVIDER_SITE_OTHER): Payer: Medicare Other

## 2015-11-14 DIAGNOSIS — Z111 Encounter for screening for respiratory tuberculosis: Secondary | ICD-10-CM | POA: Diagnosis not present

## 2015-11-16 LAB — TB SKIN TEST
Induration: 0 mm
TB SKIN TEST: NEGATIVE

## 2015-11-16 NOTE — Telephone Encounter (Signed)
[facility]  POA is to have the results (mailed) but I do not know the address: I looked through media. [facility]ing to [facility] for assistance.

## 2015-11-16 NOTE — Telephone Encounter (Signed)
FL2 faxed with TB result letter, remaining documents provided by POA returned to her via mail

## 2016-01-04 ENCOUNTER — Encounter: Payer: Self-pay | Admitting: Family Medicine

## 2016-01-08 ENCOUNTER — Ambulatory Visit: Payer: Medicare Other | Admitting: Internal Medicine

## 2016-01-17 ENCOUNTER — Ambulatory Visit (INDEPENDENT_AMBULATORY_CARE_PROVIDER_SITE_OTHER): Payer: Medicare Other | Admitting: Internal Medicine

## 2016-01-17 ENCOUNTER — Encounter: Payer: Self-pay | Admitting: Internal Medicine

## 2016-01-17 ENCOUNTER — Telehealth: Payer: Self-pay

## 2016-01-17 VITALS — BP 150/80 | HR 72 | Temp 98.8°F | Resp 16 | Ht 61.0 in | Wt 147.0 lb

## 2016-01-17 DIAGNOSIS — F0280 Dementia in other diseases classified elsewhere without behavioral disturbance: Secondary | ICD-10-CM

## 2016-01-17 DIAGNOSIS — F0393 Unspecified dementia, unspecified severity, with mood disturbance: Secondary | ICD-10-CM | POA: Insufficient documentation

## 2016-01-17 DIAGNOSIS — F329 Major depressive disorder, single episode, unspecified: Secondary | ICD-10-CM | POA: Diagnosis not present

## 2016-01-17 DIAGNOSIS — I2510 Atherosclerotic heart disease of native coronary artery without angina pectoris: Secondary | ICD-10-CM

## 2016-01-17 DIAGNOSIS — E785 Hyperlipidemia, unspecified: Secondary | ICD-10-CM | POA: Diagnosis not present

## 2016-01-17 DIAGNOSIS — K219 Gastro-esophageal reflux disease without esophagitis: Secondary | ICD-10-CM

## 2016-01-17 MED ORDER — ESOMEPRAZOLE MAGNESIUM 40 MG PO CPDR: 40.0000 mg | DELAYED_RELEASE_CAPSULE | Freq: Every day | ORAL | Status: DC

## 2016-01-17 MED ORDER — SERTRALINE HCL 50 MG PO TABS: 50.0000 mg | ORAL_TABLET | Freq: Every day | ORAL | Status: DC

## 2016-01-17 MED ORDER — ATORVASTATIN CALCIUM 40 MG PO TABS: 40.0000 mg | ORAL_TABLET | Freq: Every day | ORAL | Status: DC

## 2016-01-17 NOTE — Telephone Encounter (Signed)
[name] called today, going on about someone needs to take of [name]. All the notes from today visit need to be faxed to her. Follow up.

## 2016-01-17 NOTE — Patient Instructions (Signed)
Hypertension Hypertension, commonly called high blood pressure, is when the force of blood pumping through your arteries is too strong. Your arteries are the blood vessels that carry blood from your heart throughout your body. A blood pressure reading consists of a higher number over a lower number, such as 110/72. The higher number (systolic) is the pressure inside your arteries when your heart pumps. The lower number (diastolic) is the pressure inside your arteries when your heart relaxes. Ideally you want your blood pressure below 120/80. Hypertension forces your heart to work harder to pump blood. Your arteries may become narrow or stiff. Having untreated or uncontrolled hypertension can cause heart attack, stroke, kidney disease, and other problems. RISK FACTORS Some risk factors for high blood pressure are controllable. Others are not.  Risk factors you cannot control include:   Race. You may be at higher risk if you are African American.  Age. Risk increases with age.  Gender. Men are at higher risk than women before age 45 years. After age 65, women are at higher risk than men. Risk factors you can control include:  Not getting enough exercise or physical activity.  Being overweight.  Getting too much fat, sugar, calories, or salt in your diet.  Drinking too much alcohol. SIGNS AND SYMPTOMS Hypertension does not usually cause signs or symptoms. Extremely high blood pressure (hypertensive crisis) may cause headache, anxiety, shortness of breath, and nosebleed. DIAGNOSIS To check if you have hypertension, your health care provider will measure your blood pressure while you are seated, with your arm held at the level of your heart. It should be measured at least twice using the same arm. Certain conditions can cause a difference in blood pressure between your right and left arms. A blood pressure reading that is higher than normal on one occasion does not mean that you need treatment. If  it is not clear whether you have high blood pressure, you may be asked to return on a different day to have your blood pressure checked again. Or, you may be asked to monitor your blood pressure at home for 1 or more weeks. TREATMENT Treating high blood pressure includes making lifestyle changes and possibly taking medicine. Living a healthy lifestyle can help lower high blood pressure. You may need to change some of your habits. Lifestyle changes may include:  Following the DASH diet. This diet is high in fruits, vegetables, and whole grains. It is low in salt, red meat, and added sugars.  Keep your sodium intake below 2,300 mg per day.  Getting at least 30-45 minutes of aerobic exercise at least 4 times per week.  Losing weight if necessary.  Not smoking.  Limiting alcoholic beverages.  Learning ways to reduce stress. Your health care provider may prescribe medicine if lifestyle changes are not enough to get your blood pressure under control, and if one of the following is true:  You are 18-59 years of age and your systolic blood pressure is above 140.  You are 60 years of age or older, and your systolic blood pressure is above 150.  Your diastolic blood pressure is above 90.  You have diabetes, and your systolic blood pressure is over 140 or your diastolic blood pressure is over 90.  You have kidney disease and your blood pressure is above 140/90.  You have heart disease and your blood pressure is above 140/90. Your personal target blood pressure may vary depending on your medical conditions, your age, and other factors. HOME CARE INSTRUCTIONS    Have your blood pressure rechecked as directed by your health care provider.   Take medicines only as directed by your health care provider. Follow the directions carefully. Blood pressure medicines must be taken as prescribed. The medicine does not work as well when you skip doses. Skipping doses also puts you at risk for  problems.  Do not smoke.   Monitor your blood pressure at home as directed by your health care provider. SEEK MEDICAL CARE IF:   You think you are having a reaction to medicines taken.  You have recurrent headaches or feel dizzy.  You have swelling in your ankles.  You have trouble with your vision. SEEK IMMEDIATE MEDICAL CARE IF:  You develop a severe headache or confusion.  You have unusual weakness, numbness, or feel faint.  You have severe chest or abdominal pain.  You vomit repeatedly.  You have trouble breathing. MAKE SURE YOU:   Understand these instructions.  Will watch your condition.  Will get help right away if you are not doing well or get worse.   This information is not intended to replace advice given to you by your health care provider. Make sure you discuss any questions you have with your health care provider.   Document Released: 07/22/2005 Document Revised: 12/06/2014 Document Reviewed: 05/14/2013 Elsevier Interactive Patient Education 2016 Elsevier Inc.  

## 2016-01-17 NOTE — Progress Notes (Signed)
Pre visit review using our clinic review tool, if applicable. No additional management support is needed unless otherwise documented below in the visit note. 

## 2016-01-17 NOTE — Progress Notes (Signed)
Subjective:  Patient ID: [name], female    DOB: 06-12-1928  Age: 82 y.o. MRN: [id number]  CC: Hypertension; Depression; and Hyperlipidemia   HPI [name] presents for a blood pressure check, she feels well and offers no complaints, she is with her caretaker today. Her appetite has been good, she has had no declines in her behavior, and her caretaker offers no concerns today.  Outpatient Prescriptions Prior to Visit  Medication Sig Dispense Refill  . feeding supplement (BOOST HIGH PROTEIN) LIQD Take 237 mLs by mouth 2 (two) times daily between meals. 237 mL 11  . felodipine (PLENDIL) 5 MG 24 hr tablet TAKE 1 TABLET (5 MG TOTAL) BY MOUTH DAILY. Must keep 10/17/15 appt for future refills 30 tablet 3  . meloxicam (MOBIC) 7.5 MG tablet Take 1 tablet (7.5 mg total) by mouth daily. 30 tablet 11  . Multiple Vitamins-Minerals (CENTRUM SILVER ULTRA WOMENS PO) Take 1 tablet by mouth daily.    [name] aspirin 81 MG tablet Take 81 mg by mouth daily. Reported on 01/17/2016    . donepezil (ARICEPT) 5 MG tablet Take 5 mg by mouth. Reported on 01/17/2016    . memantine (NAMENDA XR) 28 MG CP24 24 hr capsule After you finish starter pack, start taking 1 tablet daily (Patient not taking: Reported on 11/08/2015) 30 capsule 11  . metoprolol succinate (TOPROL-XL) 50 MG 24 hr tablet Take 1 tablet (50 mg total) by mouth daily. Take with or immediately following a meal. Must keep 10/17/15 appt for refills (Patient not taking: Reported on 01/17/2016) 30 tablet 0  . atorvastatin (LIPITOR) 40 MG tablet Take 1 tablet (40 mg total) by mouth daily. (Patient not taking: Reported on 01/17/2016) 90 tablet 3  . COMBIGAN 0.2-0.5 % ophthalmic solution 1 drop every 12 (twelve) hours.     . dorzolamide-timolol (COSOPT) 22.3-6.8 MG/ML ophthalmic solution Place 1 drop into both eyes Three times a day.    . esomeprazole (NEXIUM) 40 MG capsule Take 1 capsule (40 mg total) by mouth daily. Must keep 10/17/15 appt for future refills  (Patient not taking: Reported on 01/17/2016) 30 capsule 0  . lactose free nutrition (BOOST) LIQD Reported on 11/08/2015    . sertraline (ZOLOFT) 50 MG tablet Take 1 tablet (50 mg total) by mouth daily. Must keep 10/17/15 appt for refills (Patient not taking: Reported on 01/17/2016) 30 tablet 0   No facility-administered medications prior to visit.    ROS Review of Systems  Constitutional: Negative.  Negative for fever, diaphoresis, activity change, appetite change, fatigue and unexpected weight change.  HENT: Negative.  Negative for congestion, sinus pressure, sore throat and trouble swallowing.   Eyes: Negative.  Negative for visual disturbance.  Respiratory: Negative.  Negative for cough, choking, chest tightness, shortness of breath and stridor.   Cardiovascular: Negative.  Negative for chest pain, palpitations and leg swelling.  Gastrointestinal: Negative.  Negative for nausea, vomiting, abdominal pain, diarrhea and constipation.  Endocrine: Negative.   Genitourinary: Negative.   Musculoskeletal: Negative.  Negative for myalgias, back pain, arthralgias and neck pain.  Skin: Negative.  Negative for color change and rash.  Allergic/Immunologic: Negative.   Neurological: Negative.  Negative for dizziness and weakness.  Hematological: Negative.  Negative for adenopathy. Does not bruise/bleed easily.  Psychiatric/Behavioral: Negative.     Objective:  BP 150/80 mmHg  Pulse 72  Temp(Src) 98.8 F (37.1 C) (Oral)  Resp 16  Ht 5\' 1"  (1.549 m)  Wt 147 lb (66.679 kg)  BMI  27.79 kg/m2  SpO2 94%  BP Readings from Last 3 Encounters:  01/17/16 150/80  11/08/15 140/82  10/17/15 140/68    Wt Readings from Last 3 Encounters:  01/17/16 147 lb (66.679 kg)  11/08/15 144 lb (65.318 kg)  10/17/15 142 lb (64.411 kg)    Physical Exam  Constitutional: She is oriented to person, place, and time. No distress.  HENT:  Mouth/Throat: Oropharynx is clear and moist. No oropharyngeal exudate.  Eyes:  Conjunctivae are normal. Right eye exhibits no discharge. Left eye exhibits no discharge. No scleral icterus.  Neck: Normal range of motion. Neck supple. No JVD present. No tracheal deviation present. No thyromegaly present.  Cardiovascular: Normal rate, regular rhythm, normal heart sounds and intact distal pulses.  Exam reveals no gallop and no friction rub.   No murmur heard. Pulmonary/Chest: Effort normal and breath sounds normal. No stridor. No respiratory distress. She has no wheezes. She has no rales. She exhibits no tenderness.  Abdominal: Soft. Bowel sounds are normal. She exhibits no distension and no mass. There is no tenderness. There is no rebound and no guarding.  Musculoskeletal: Normal range of motion. She exhibits no edema or tenderness.  Lymphadenopathy:    She has no cervical adenopathy.  Neurological: She is oriented to person, place, and time.  Skin: Skin is warm and dry. No rash noted. She is not diaphoretic. No erythema. No pallor.  Vitals reviewed.   Lab Results  Component Value Date   WBC 6.8 10/17/2015   HGB 14.4 10/17/2015   HCT 45.2 10/17/2015   PLT 301.0 10/17/2015   GLUCOSE 112* 10/17/2015   CHOL 219* 10/17/2015   TRIG 88.0 10/17/2015   HDL 70.00 10/17/2015   LDLDIRECT 140.3 11/11/2011   LDLCALC 131* 10/17/2015   ALT 11 10/17/2015   AST 24 10/17/2015   NA 145 10/17/2015   K 4.2 10/17/2015   CL 107 10/17/2015   CREATININE 0.94 10/17/2015   BUN 17 10/17/2015   CO2 23 10/17/2015   TSH 0.57 10/17/2015   INR 1.1 02/09/2008    Ct Head Wo Contrast  09/05/2015  CLINICAL DATA:  82 year old hypertensive female with moderate dementia worsening over the past year. Initial encounter. EXAM: CT HEAD WITHOUT CONTRAST TECHNIQUE: Contiguous axial images were obtained from the base of the skull through the vertex without intravenous contrast. COMPARISON:  No comparison head CT or brain MR. FINDINGS: No intracranial hemorrhage or CT evidence of large acute infarct.  There is slightly dense appearance of right middle cerebral artery brain which is probably an incidental finding. Moderate global atrophy. Ventricular prominence felt to be related to central atrophy rather than hydrocephalus. Moderate small vessel disease changes. No intracranial mass lesion noted on this unenhanced exam. Vascular calcifications. Hyperostosis frontalis interna incidentally noted. Mastoid air cells and middle ear cavities are clear. Orbital structures not visualized. IMPRESSION: No intracranial hemorrhage or CT evidence of large acute infarct. Moderate small vessel disease changes. Moderate global atrophy. Ventricular prominence felt to be related to central atrophy rather than hydrocephalus. Electronically Signed   By: Lacy DuverneySteven  Olson M.D.   On: 09/05/2015 13:44    Assessment & Plan:   [name] was seen today for hypertension, depression and hyperlipidemia.  Diagnoses and all orders for this visit:  Gastroesophageal reflux disease without esophagitis- she is doing well on the PPI, will continue -     esomeprazole (NEXIUM) 40 MG capsule; Take 1 capsule (40 mg total) by mouth daily.  Atherosclerosis of native coronary artery of native heart  without angina pectoris- she has had no recent episodes of angina, will continue risk factor modification with blood pressure control and statin therapy. -     atorvastatin (LIPITOR) 40 MG tablet; Take 1 tablet (40 mg total) by mouth daily.  Hyperlipidemia with target LDL less than 100- she has not achieved her LDL goal however at her age I don't think there is any benefit in being more aggressive about LDL reduction, at her request she will cont the statin therapy -     atorvastatin (LIPITOR) 40 MG tablet; Take 1 tablet (40 mg total) by mouth daily.  Depression due to dementia- doing well on sertraline -     sertraline (ZOLOFT) 50 MG tablet; Take 1 tablet (50 mg total) by mouth daily.   I have discontinued [name]'s dorzolamide-timolol,  lactose free nutrition, and COMBIGAN. I have also changed her sertraline and esomeprazole. Additionally, I am having her maintain her Multiple Vitamins-Minerals (CENTRUM SILVER ULTRA WOMENS PO), aspirin, feeding supplement, meloxicam, donepezil, memantine, felodipine, metoprolol succinate, latanoprost, and atorvastatin.  Meds ordered this encounter  Medications  . latanoprost (XALATAN) 0.005 % ophthalmic solution    Sig:   . sertraline (ZOLOFT) 50 MG tablet    Sig: Take 1 tablet (50 mg total) by mouth daily.    Dispense:  90 tablet    Refill:  3  . esomeprazole (NEXIUM) 40 MG capsule    Sig: Take 1 capsule (40 mg total) by mouth daily.    Dispense:  90 capsule    Refill:  3  . atorvastatin (LIPITOR) 40 MG tablet    Sig: Take 1 tablet (40 mg total) by mouth daily.    Dispense:  90 tablet    Refill:  3     Follow-up: Return if symptoms worsen or fail to improve.  Sanda Linger, MD

## 2016-01-22 ENCOUNTER — Telehealth: Payer: Self-pay | Admitting: Internal Medicine

## 2016-01-22 DIAGNOSIS — F0390 Unspecified dementia without behavioral disturbance: Secondary | ICD-10-CM

## 2016-01-22 DIAGNOSIS — F329 Major depressive disorder, single episode, unspecified: Secondary | ICD-10-CM

## 2016-01-22 DIAGNOSIS — K219 Gastro-esophageal reflux disease without esophagitis: Secondary | ICD-10-CM

## 2016-01-22 DIAGNOSIS — M159 Polyosteoarthritis, unspecified: Secondary | ICD-10-CM

## 2016-01-22 DIAGNOSIS — F0393 Unspecified dementia, unspecified severity, with mood disturbance: Secondary | ICD-10-CM

## 2016-01-22 DIAGNOSIS — I10 Essential (primary) hypertension: Secondary | ICD-10-CM

## 2016-01-22 DIAGNOSIS — M150 Primary generalized (osteo)arthritis: Secondary | ICD-10-CM

## 2016-01-22 DIAGNOSIS — I2510 Atherosclerotic heart disease of native coronary artery without angina pectoris: Secondary | ICD-10-CM

## 2016-01-22 DIAGNOSIS — F0280 Dementia in other diseases classified elsewhere without behavioral disturbance: Secondary | ICD-10-CM

## 2016-01-22 DIAGNOSIS — F03B0 Unspecified dementia, moderate, without behavioral disturbance, psychotic disturbance, mood disturbance, and anxiety: Secondary | ICD-10-CM

## 2016-01-22 DIAGNOSIS — E785 Hyperlipidemia, unspecified: Secondary | ICD-10-CM

## 2016-01-22 NOTE — Telephone Encounter (Signed)
Jenny SingerElzora, guardian, request to speak to the assistant. She wants to see if we can get a Home Health Aid to come and help Ms. Stuck due to her medical condition. Please call her back

## 2016-01-24 NOTE — Telephone Encounter (Signed)
Guardian has called back in regard.  States current care giver is no longer caring for patient and will not notify where patients meds are.  States that patient has several meds she has not taken in days.  Is requesting new scripts to be sent to [facility] on [address]  Please follow up in regard at [phone].

## 2016-01-25 MED ORDER — DONEPEZIL HCL 5 MG PO TABS: 5.0000 mg | ORAL_TABLET | Freq: Every day | ORAL | Status: AC

## 2016-01-25 MED ORDER — SERTRALINE HCL 50 MG PO TABS: 50.0000 mg | ORAL_TABLET | Freq: Every day | ORAL | Status: AC

## 2016-01-25 MED ORDER — METOPROLOL SUCCINATE ER 50 MG PO TB24: 50.0000 mg | ORAL_TABLET | Freq: Every day | ORAL | Status: DC

## 2016-01-25 MED ORDER — MELOXICAM 7.5 MG PO TABS: 7.5000 mg | ORAL_TABLET | Freq: Every day | ORAL | Status: DC

## 2016-01-25 MED ORDER — MEMANTINE HCL ER 28 MG PO CP24: ORAL_CAPSULE | ORAL | Status: AC

## 2016-01-25 MED ORDER — ESOMEPRAZOLE MAGNESIUM 40 MG PO CPDR: 40.0000 mg | DELAYED_RELEASE_CAPSULE | Freq: Every day | ORAL | Status: AC

## 2016-01-25 MED ORDER — FELODIPINE ER 5 MG PO TB24: 5.0000 mg | ORAL_TABLET | Freq: Every day | ORAL | Status: AC

## 2016-01-25 MED ORDER — ATORVASTATIN CALCIUM 40 MG PO TABS: 40.0000 mg | ORAL_TABLET | Freq: Every day | ORAL | Status: AC

## 2016-01-25 NOTE — Telephone Encounter (Signed)
Spoke to [facility]green[facility]ora and apologized for the confusion about POA. [name] stated that she has the determination from the hearing with the judge and is faxing the legal document for me. I informed [name] that the prescriptions requested were sent in today and that we have receipt confirmation from the pharmacy. [facility] but I do not think that the request will full fill the need for the patient. [name] that the hired caregiver quit and she needs to find a new one.   Forwarding to referral for help with CNA full time care.

## 2016-01-25 NOTE — Telephone Encounter (Signed)
Called [name] no answer LMOM [facility],,,/lmb

## 2016-01-25 NOTE — Addendum Note (Signed)
Addended by: Etta GrandchildJONES, Dixie Coppa L on: 01/25/2016 11:23 AM   Modules accepted: Orders

## 2016-02-22 ENCOUNTER — Telehealth: Payer: Self-pay | Admitting: Neurology

## 2016-02-22 ENCOUNTER — Telehealth: Payer: Self-pay

## 2016-02-22 NOTE — Telephone Encounter (Signed)
PT's daughter [name] called and asked if she could get an assessment sent to her after the appointment/Dawn CB# [phone]

## 2016-02-22 NOTE — Telephone Encounter (Signed)
Home Health Cert/Plan of Care received (02/11/2016 - 04/10/2016) and placed on MD's desk for signature  

## 2016-02-22 NOTE — Telephone Encounter (Signed)
Paperwork signed, faxed, copy sent to scan 

## 2016-02-26 ENCOUNTER — Ambulatory Visit (INDEPENDENT_AMBULATORY_CARE_PROVIDER_SITE_OTHER): Payer: Medicare Other | Admitting: Neurology

## 2016-02-26 ENCOUNTER — Encounter: Payer: Self-pay | Admitting: Neurology

## 2016-02-26 VITALS — BP 152/78 | HR 74 | Temp 98.9°F | Ht 61.0 in | Wt 147.0 lb

## 2016-02-26 DIAGNOSIS — F0390 Unspecified dementia without behavioral disturbance: Secondary | ICD-10-CM

## 2016-02-26 DIAGNOSIS — F03B0 Unspecified dementia, moderate, without behavioral disturbance, psychotic disturbance, mood disturbance, and anxiety: Secondary | ICD-10-CM

## 2016-02-26 NOTE — Patient Instructions (Signed)
1. Continue all your medications 2. Recommend transitioning from independent living to a nursing home with 24/7 supervision 3. Follow-up in 1 year

## 2016-02-26 NOTE — Progress Notes (Signed)
NEUROLOGY FOLLOW UP OFFICE NOTE  [name] [id number]  HISTORY OF PRESENT ILLNESS: I had the pleasure of seeing [name] in follow-up in the neurology clinic on 02/26/2016.  The patient was last seen 6 months ago for moderate dementia and is accompanied by her caregiver today.  MMSE in January 2017 was 14/30.  Records and images were personally reviewed where available. They forgot to bring her list of medications and will fax to our office for review. She reports that her memory is not as good as it used to be. She is argumentative with her caregiver, stating she does not know her (caregiver has only been with her for a month). She lives in an independent living facility and has home health coming several hours a day to administer medications. She goes down to the hall for meals. Her caregiver reports that she has left open food on the counter with roaches, and has been found to have stool on the floor by cleaning staff. She can bathe herself independently. She keeps repeating she lives on campus "where the children are." She denies any falls. She has occasional headaches and takes Tylenol. She denies any dizziness, vision changes, focal numbness/tingling/weakness. She does not drive.  I personally reviewed CT head without contrast done 09/05/15 which did not show any acute changes, there was moderate diffuse atrophy, moderate chronic microvascular disease.  HPI: This is a pleasant 82 yo RH woman with a history of hypertension, hyperlipidemia, with moderate dementia. She has a caregiver who stays with her 7 days a week, but is on vacation and currently comes in today with a friend who has known her for a year. The patient reports that she has no living relatives except for her sister-in-law, who is her medical power of attorney. The patient is a poor historian, she reports that her memory is "sometimes good, but most of the time not as good as she wants it to be." She had previously been  living in her house, then moved to an independent living facility where she has a common dining area. She has a caregiver staying with her daily, but now that the caregiver is on vacation, friends are alteranating looking after her several hours a day. She initially reported she pays her bills, but her friend reminded her that her sister-in-law takes car of her bills. On review of EPIC notes, she was found sitting on a bench outside her ALF at 6am last month. They were unsure how long she was sitting there, no injuries. She does not drive.  PAST MEDICAL HISTORY: Past Medical History:  Diagnosis Date  . Abnormality of gait   . Anemia   . Anxiety   . Bronchitis, mucopurulent recurrent (HCC)   . CAD (coronary artery disease)   . Chronic osteomyelitis, other specified site   . Depression   . Diverticulosis of colon   . DJD (degenerative joint disease)   . Dysrhythmia    OCC HEART PALPITATION DR [name]  . GERD (gastroesophageal reflux disease)   . Glaucoma   . Hearing loss   . Hiatal hernia   . Hx of adenomatous colonic polyps   . Hypercholesteremia   . Hypertension   . Lumbar back pain   . OSA (obstructive sleep apnea)    CPAP  MANY YRS AGO NOT SURE WHERE DONE  . Palpitations   . Personal history of other diseases of digestive system   . Thyroid nodule   . Vitamin D deficiency  MEDICATIONS: Current Outpatient Prescriptions on File Prior to Visit  Medication Sig Dispense Refill  . aspirin 81 MG tablet Take 81 mg by mouth daily. Reported on 01/17/2016    . atorvastatin (LIPITOR) 40 MG tablet Take 1 tablet (40 mg total) by mouth daily. 90 tablet 2  . donepezil (ARICEPT) 5 MG tablet Take 1 tablet (5 mg total) by mouth at bedtime. Reported on 01/17/2016 90 tablet 2  . esomeprazole (NEXIUM) 40 MG capsule Take 1 capsule (40 mg total) by mouth daily. 90 capsule 2  . feeding supplement (BOOST HIGH PROTEIN) LIQD Take 237 mLs by mouth 2 (two) times daily between meals. 237 mL 11  .  felodipine (PLENDIL) 5 MG 24 hr tablet Take 1 tablet (5 mg total) by mouth daily. TAKE 1 TABLET (5 MG TOTAL) BY MOUTH DAILY. 90 tablet 2  . latanoprost (XALATAN) 0.005 % ophthalmic solution     . meloxicam (MOBIC) 7.5 MG tablet Take 1 tablet (7.5 mg total) by mouth daily. 90 tablet 2  . memantine (NAMENDA XR) 28 MG CP24 24 hr capsule After you finish starter pack, start taking 1 tablet daily 30 capsule 11  . metoprolol succinate (TOPROL-XL) 50 MG 24 hr tablet Take 1 tablet (50 mg total) by mouth daily. Take with or immediately following a meal. 90 tablet 2  . Multiple Vitamins-Minerals (CENTRUM SILVER ULTRA WOMENS PO) Take 1 tablet by mouth daily.    . sertraline (ZOLOFT) 50 MG tablet Take 1 tablet (50 mg total) by mouth daily. 90 tablet 2   No current facility-administered medications on file prior to visit.     ALLERGIES: Allergies  Allergen Reactions  . Amoxicillin Itching    Chest and back  . Cephalexin Itching    Chest and back  . Penicillins Itching    Chest and back    FAMILY HISTORY: No family history on file.  SOCIAL HISTORY: Social History   Social History  . Marital status: Widowed    Spouse name: N/A  . Number of children: 2  . Years of education: N/A   Occupational History  .  Retired   Social History Main Topics  . Smoking status: Never Smoker  . Smokeless tobacco: Never Used  . Alcohol use No  . Drug use: No  . Sexual activity: Not on file   Other Topics Concern  . Not on file   Social History Narrative  . No narrative on file    REVIEW OF SYSTEMS: Constitutional: No fevers, chills, or sweats, no generalized fatigue, change in appetite Eyes: No visual changes, double vision, eye pain Ear, nose and throat: No hearing loss, ear pain, nasal congestion, sore throat Cardiovascular: No chest pain, palpitations Respiratory:  No shortness of breath at rest or with exertion, wheezes GastrointestinaI: No nausea, vomiting, diarrhea, abdominal pain, fecal  incontinence Genitourinary:  No dysuria, urinary retention or frequency Musculoskeletal:  No neck pain, back pain Integumentary: No rash, pruritus, skin lesions Neurological: as above Psychiatric: No depression, insomnia, anxiety Endocrine: No palpitations, fatigue, diaphoresis, mood swings, change in appetite, change in weight, increased thirst Hematologic/Lymphatic:  No anemia, purpura, petechiae. Allergic/Immunologic: no itchy/runny eyes, nasal congestion, recent allergic reactions, rashes  PHYSICAL EXAM: Vitals:   02/26/16 1303  BP: (!) 152/78  Pulse: 74  Temp: 98.9 F (37.2 C)   General: No acute distress Head:  Normocephalic/atraumatic Neck: supple, no paraspinal tenderness, full range of motion Heart:  Regular rate and rhythm Lungs:  Clear to auscultation bilaterally Back: No paraspinal  tenderness Skin/Extremities: No rash, no edema Neurological Exam: alert and oriented to person, place, and time. No aphasia or dysarthria. Fund of knowledge is appropriate.  Remote memory intact.  Attention and concentration are normal.    Able to name objects and repeat phrases. CDT 1/5 MMSE - Mini Mental State Exam 02/26/2016 08/25/2015  Orientation to time 1 0  Orientation to Place 3 4  Registration 3 3  Attention/ Calculation 1 0  Recall 0 0  Language- name 2 objects 2 2  Language- repeat 1 1  Language- follow 3 step command 3 3  Language- read & follow direction 1 1  Write a sentence 1 0  Copy design 0 0  Total score 16 14    Cranial nerves: Pupils equal, round, reactive to light.  Extraocular movements intact with no nystagmus. Visual fields full. Facial sensation intact. No facial asymmetry. Tongue, uvula, palate midline.  Motor: Bulk and tone normal, muscle strength 5/5 throughout with no pronator drift.  Sensation to light touch intact.  No extinction to double simultaneous stimulation.  Deep tendon reflexes 2+ throughout, toes downgoing.  Finger to nose testing intact.  Gait slow  and cautious, ambulates with cane.  IMPRESSION: This is a pleasant 82 yo RH woman with a history of hypertension, hyperlipidemia, and moderate dementia, likely Alzheimer's type. Head CT without contrast showed age-related changes. Her MMSE today is 16/30 (14/30 in January 2017). She is living in an independent living facility with home health visiting a few hours a day to help with medications. Home health reports issues with home hygiene. At this point recommend 24/7 supervision and transition to a Memory care facility/SNF. Continue current medications, her visiting nurse will fax [place] updated medication list. She will follow-up in 1 year.   Thank you for allowing me to participate in her care.  Please do not hesitate to call for any questions or concerns.  The duration of this appointment visit was 25 minutes of face-to-face time with the patient.  Greater than 50% of this time was spent in counseling, explanation of diagnosis, planning of further management, and coordination of care.   Patrcia Dolly, M.D.   CC: Dr. Maisie Fus

## 2016-02-27 ENCOUNTER — Telehealth: Payer: Self-pay | Admitting: Neurology

## 2016-02-27 NOTE — Telephone Encounter (Signed)
Spoke to patient's medical POA Barbette Merino re: recommendation for 24/7 supervision in Memory Care/SNF facility. She is in [place] and would like resources/recommendations.  Morrie Sheldon, pls mail her my most recent visit note and pamphlets for SNFs and Memory Care facilities in [place]. Her address is   [address] [place], [place] [address].  Thanks

## 2016-02-27 NOTE — Telephone Encounter (Signed)
See next note

## 2016-02-28 NOTE — Telephone Encounter (Signed)
Note and information mailed.

## 2016-03-07 ENCOUNTER — Ambulatory Visit (INDEPENDENT_AMBULATORY_CARE_PROVIDER_SITE_OTHER): Payer: Medicare Other | Admitting: Internal Medicine

## 2016-03-07 ENCOUNTER — Encounter: Payer: Self-pay | Admitting: Internal Medicine

## 2016-03-07 VITALS — BP 140/88 | HR 65 | Temp 98.1°F | Resp 16 | Ht 61.0 in | Wt 149.0 lb

## 2016-03-07 DIAGNOSIS — I10 Essential (primary) hypertension: Secondary | ICD-10-CM | POA: Diagnosis not present

## 2016-03-07 NOTE — Progress Notes (Signed)
Subjective:  Patient ID: [name], female    DOB: 1928/01/04  Age: 82 y.o. MRN: [id number]  CC: Other (FL 2, moving from independent living to memory care. ) and Hypertension   HPI [name] presents for a BP check, She is transitioning to a memory care unit and needs an FL2 form completed. She complains of memory loss and confusion but offers no other complaints today.  Outpatient Medications Prior to Visit  Medication Sig Dispense Refill  . aspirin 81 MG tablet Take 81 mg by mouth daily. Reported on 01/17/2016    . atorvastatin (LIPITOR) 40 MG tablet Take 1 tablet (40 mg total) by mouth daily. 90 tablet 2  . donepezil (ARICEPT) 5 MG tablet Take 1 tablet (5 mg total) by mouth at bedtime. Reported on 01/17/2016 90 tablet 2  . esomeprazole (NEXIUM) 40 MG capsule Take 1 capsule (40 mg total) by mouth daily. 90 capsule 2  . feeding supplement (BOOST HIGH PROTEIN) LIQD Take 237 mLs by mouth 2 (two) times daily between meals. 237 mL 11  . felodipine (PLENDIL) 5 MG 24 hr tablet Take 1 tablet (5 mg total) by mouth daily. TAKE 1 TABLET (5 MG TOTAL) BY MOUTH DAILY. 90 tablet 2  . latanoprost (XALATAN) 0.005 % ophthalmic solution     . meloxicam (MOBIC) 7.5 MG tablet Take 1 tablet (7.5 mg total) by mouth daily. 90 tablet 2  . memantine (NAMENDA XR) 28 MG CP24 24 hr capsule After you finish starter pack, start taking 1 tablet daily 30 capsule 11  . metoprolol succinate (TOPROL-XL) 50 MG 24 hr tablet Take 1 tablet (50 mg total) by mouth daily. Take with or immediately following a meal. 90 tablet 2  . Multiple Vitamins-Minerals (CENTRUM SILVER ULTRA WOMENS PO) Take 1 tablet by mouth daily.    . sertraline (ZOLOFT) 50 MG tablet Take 1 tablet (50 mg total) by mouth daily. 90 tablet 2   No facility-administered medications prior to visit.     ROS Review of Systems  Constitutional: Negative.  Negative for fatigue and unexpected weight change.  HENT: Negative.   Eyes: Negative.  Negative  for visual disturbance.  Respiratory: Negative.  Negative for cough, choking, chest tightness, shortness of breath and stridor.   Cardiovascular: Negative.  Negative for chest pain, palpitations and leg swelling.  Gastrointestinal: Negative.  Negative for abdominal pain, diarrhea and nausea.  Endocrine: Negative.   Genitourinary: Negative.  Negative for decreased urine volume, dysuria, flank pain, frequency, hematuria and urgency.  Musculoskeletal: Negative.  Negative for arthralgias, back pain, myalgias and neck pain.  Skin: Negative.   Neurological: Negative.   Hematological: Negative.   Psychiatric/Behavioral: Positive for confusion and decreased concentration. Negative for agitation, behavioral problems, hallucinations, self-injury, sleep disturbance and suicidal ideas. The patient is not nervous/anxious.     Objective:  BP 140/88   Pulse 65   Ht  (1.549 m)   Wt 149 lb (67.6 kg)   SpO2 95%   BMI 28.15 kg/m   BP Readings from Last 3 Encounters:  03/07/16 140/88  02/26/16 (!) 152/78  01/17/16 (!) 150/80    Wt Readings from Last 3 Encounters:  03/07/16 149 lb (67.6 kg)  02/26/16 147 lb (66.7 kg)  01/17/16 147 lb (66.7 kg)    Physical Exam  Constitutional: She is oriented to person, place, and time. No distress.  HENT:  Mouth/Throat: Oropharynx is clear and moist. No oropharyngeal exudate.  Eyes: Conjunctivae are normal. Right eye exhibits  no discharge. Left eye exhibits no discharge. No scleral icterus.  Neck: Normal range of motion. Neck supple. No JVD present. No tracheal deviation present. No thyromegaly present.  Cardiovascular: Normal rate, regular rhythm, normal heart sounds and intact distal pulses.  Exam reveals no gallop and no friction rub.   No murmur heard. Pulmonary/Chest: Effort normal and breath sounds normal. No stridor. No respiratory distress. She has no wheezes. She has no rales. She exhibits no tenderness.  Abdominal: Soft. Bowel sounds are normal.  She exhibits no distension and no mass. There is no tenderness. There is no rebound and no guarding.  Musculoskeletal: Normal range of motion. She exhibits no edema, tenderness or deformity.  Lymphadenopathy:    She has no cervical adenopathy.  Neurological: She is oriented to person, place, and time.  Skin: Skin is warm and dry. No rash noted. She is not diaphoretic. No erythema. No pallor.    Lab Results  Component Value Date   WBC 6.8 10/17/2015   HGB 14.4 10/17/2015   HCT 45.2 10/17/2015   PLT 301.0 10/17/2015   GLUCOSE 112 (H) 10/17/2015   CHOL 219 (H) 10/17/2015   TRIG 88.0 10/17/2015   HDL 70.00 10/17/2015   LDLDIRECT 140.3 11/11/2011   LDLCALC 131 (H) 10/17/2015   ALT 11 10/17/2015   AST 24 10/17/2015   NA 145 10/17/2015   K 4.2 10/17/2015   CL 107 10/17/2015   CREATININE 0.94 10/17/2015   BUN 17 10/17/2015   CO2 23 10/17/2015   TSH 0.57 10/17/2015   INR 1.1 02/09/2008    Ct Head Wo Contrast  Result Date: 09/05/2015 CLINICAL DATA:  82 year old hypertensive female with moderate dementia worsening over the past year. Initial encounter. EXAM: CT HEAD WITHOUT CONTRAST TECHNIQUE: Contiguous axial images were obtained from the base of the skull through the vertex without intravenous contrast. COMPARISON:  No comparison head CT or brain MR. FINDINGS: No intracranial hemorrhage or CT evidence of large acute infarct. There is slightly dense appearance of right middle cerebral artery brain which is probably an incidental finding. Moderate global atrophy. Ventricular prominence felt to be related to central atrophy rather than hydrocephalus. Moderate small vessel disease changes. No intracranial mass lesion noted on this unenhanced exam. Vascular calcifications. Hyperostosis frontalis interna incidentally noted. Mastoid air cells and middle ear cavities are clear. Orbital structures not visualized. IMPRESSION: No intracranial hemorrhage or CT evidence of large acute infarct. Moderate  small vessel disease changes. Moderate global atrophy. Ventricular prominence felt to be related to central atrophy rather than hydrocephalus. Electronically Signed   By: Lacy Duverney M.D.   On: 09/05/2015 13:44    Assessment & Plan:   Kendahl was seen today for other and hypertension.  Diagnoses and all orders for this visit:  Essential hypertension- her BP is well controlled, FL2 form was completed.   I am having [name] maintain her Multiple Vitamins-Minerals (CENTRUM SILVER ULTRA WOMENS PO), aspirin, feeding supplement, latanoprost, atorvastatin, donepezil, esomeprazole, felodipine, meloxicam, metoprolol succinate, sertraline, and memantine.  No orders of the defined types were placed in this encounter.    Follow-up: Return if symptoms worsen or fail to improve.  Sanda Linger, MD

## 2016-03-07 NOTE — Patient Instructions (Signed)
Hypertension Hypertension, commonly called high blood pressure, is when the force of blood pumping through your arteries is too strong. Your arteries are the blood vessels that carry blood from your heart throughout your body. A blood pressure reading consists of a higher number over a lower number, such as 110/72. The higher number (systolic) is the pressure inside your arteries when your heart pumps. The lower number (diastolic) is the pressure inside your arteries when your heart relaxes. Ideally you want your blood pressure below 120/80. Hypertension forces your heart to work harder to pump blood. Your arteries may become narrow or stiff. Having untreated or uncontrolled hypertension can cause heart attack, stroke, kidney disease, and other problems. RISK FACTORS Some risk factors for high blood pressure are controllable. Others are not.  Risk factors you cannot control include:   Race. You may be at higher risk if you are African American.  Age. Risk increases with age.  Gender. Men are at higher risk than women before age 45 years. After age 65, women are at higher risk than men. Risk factors you can control include:  Not getting enough exercise or physical activity.  Being overweight.  Getting too much fat, sugar, calories, or salt in your diet.  Drinking too much alcohol. SIGNS AND SYMPTOMS Hypertension does not usually cause signs or symptoms. Extremely high blood pressure (hypertensive crisis) may cause headache, anxiety, shortness of breath, and nosebleed. DIAGNOSIS To check if you have hypertension, your health care provider will measure your blood pressure while you are seated, with your arm held at the level of your heart. It should be measured at least twice using the same arm. Certain conditions can cause a difference in blood pressure between your right and left arms. A blood pressure reading that is higher than normal on one occasion does not mean that you need treatment. If  it is not clear whether you have high blood pressure, you may be asked to return on a different day to have your blood pressure checked again. Or, you may be asked to monitor your blood pressure at home for 1 or more weeks. TREATMENT Treating high blood pressure includes making lifestyle changes and possibly taking medicine. Living a healthy lifestyle can help lower high blood pressure. You may need to change some of your habits. Lifestyle changes may include:  Following the DASH diet. This diet is high in fruits, vegetables, and whole grains. It is low in salt, red meat, and added sugars.  Keep your sodium intake below 2,300 mg per day.  Getting at least 30-45 minutes of aerobic exercise at least 4 times per week.  Losing weight if necessary.  Not smoking.  Limiting alcoholic beverages.  Learning ways to reduce stress. Your health care provider may prescribe medicine if lifestyle changes are not enough to get your blood pressure under control, and if one of the following is true:  You are 18-59 years of age and your systolic blood pressure is above 140.  You are 60 years of age or older, and your systolic blood pressure is above 150.  Your diastolic blood pressure is above 90.  You have diabetes, and your systolic blood pressure is over 140 or your diastolic blood pressure is over 90.  You have kidney disease and your blood pressure is above 140/90.  You have heart disease and your blood pressure is above 140/90. Your personal target blood pressure may vary depending on your medical conditions, your age, and other factors. HOME CARE INSTRUCTIONS    Have your blood pressure rechecked as directed by your health care provider.   Take medicines only as directed by your health care provider. Follow the directions carefully. Blood pressure medicines must be taken as prescribed. The medicine does not work as well when you skip doses. Skipping doses also puts you at risk for  problems.  Do not smoke.   Monitor your blood pressure at home as directed by your health care provider. SEEK MEDICAL CARE IF:   You think you are having a reaction to medicines taken.  You have recurrent headaches or feel dizzy.  You have swelling in your ankles.  You have trouble with your vision. SEEK IMMEDIATE MEDICAL CARE IF:  You develop a severe headache or confusion.  You have unusual weakness, numbness, or feel faint.  You have severe chest or abdominal pain.  You vomit repeatedly.  You have trouble breathing. MAKE SURE YOU:   Understand these instructions.  Will watch your condition.  Will get help right away if you are not doing well or get worse.   This information is not intended to replace advice given to you by your health care provider. Make sure you discuss any questions you have with your health care provider.   Document Released: 07/22/2005 Document Revised: 12/06/2014 Document Reviewed: 05/14/2013 Elsevier Interactive Patient Education 2016 Elsevier Inc.  

## 2016-03-19 DIAGNOSIS — F329 Major depressive disorder, single episode, unspecified: Secondary | ICD-10-CM

## 2016-03-19 DIAGNOSIS — F0390 Unspecified dementia without behavioral disturbance: Secondary | ICD-10-CM

## 2016-03-19 DIAGNOSIS — I10 Essential (primary) hypertension: Secondary | ICD-10-CM

## 2016-03-19 DIAGNOSIS — I2510 Atherosclerotic heart disease of native coronary artery without angina pectoris: Secondary | ICD-10-CM

## 2016-03-19 DIAGNOSIS — Z9181 History of falling: Secondary | ICD-10-CM

## 2016-03-19 DIAGNOSIS — M150 Primary generalized (osteo)arthritis: Secondary | ICD-10-CM

## 2016-03-19 DIAGNOSIS — M25561 Pain in right knee: Secondary | ICD-10-CM

## 2016-03-19 DIAGNOSIS — M6281 Muscle weakness (generalized): Secondary | ICD-10-CM

## 2016-03-19 DIAGNOSIS — R41841 Cognitive communication deficit: Secondary | ICD-10-CM

## 2016-03-19 DIAGNOSIS — Z7982 Long term (current) use of aspirin: Secondary | ICD-10-CM

## 2016-03-29 ENCOUNTER — Telehealth: Payer: Self-pay | Admitting: Emergency Medicine

## 2016-03-29 NOTE — Telephone Encounter (Signed)
Printed most recent in media tab. Faxed to number requested.

## 2016-03-29 NOTE — Telephone Encounter (Signed)
[facility] called stating they have not received the FL2 forms or FL2 addendum. They wanted to know if they could get those faxed over to them. Fax # is [phone]. Thanks.

## 2016-04-01 ENCOUNTER — Telehealth: Payer: Self-pay | Admitting: Internal Medicine

## 2016-04-01 DIAGNOSIS — M5440 Lumbago with sciatica, unspecified side: Secondary | ICD-10-CM

## 2016-04-01 NOTE — Telephone Encounter (Signed)
YES

## 2016-04-01 NOTE — Telephone Encounter (Signed)
Printed for PCP

## 2016-04-01 NOTE — Telephone Encounter (Signed)
Requesting a rollator walker for patient.  States that this was discussed at last OV.  Would like an approval and a fax to [facility] with an order. fx [phone]

## 2016-04-03 NOTE — Telephone Encounter (Signed)
This has been faxed.

## 2016-04-04 ENCOUNTER — Telehealth: Payer: Self-pay | Admitting: Internal Medicine

## 2016-04-04 NOTE — Telephone Encounter (Signed)
re-faxed

## 2016-04-04 NOTE — Telephone Encounter (Signed)
Christel MormonZee, PT from [facility], called request written order for rolling walker for Ms. [name] due to poor balance. Please fax rx to [phone] with Dr. Yetta BarreJones NPI # as well.

## 2016-04-05 ENCOUNTER — Encounter (HOSPITAL_COMMUNITY): Payer: Self-pay

## 2016-04-05 ENCOUNTER — Emergency Department (HOSPITAL_COMMUNITY): Payer: Medicare Other

## 2016-04-05 ENCOUNTER — Inpatient Hospital Stay (HOSPITAL_COMMUNITY)
Admission: EM | Admit: 2016-04-05 | Discharge: 2016-04-09 | DRG: 871 | Disposition: A | Discharge status: Skilled Nursing Facility | Payer: Medicare Other | Attending: Family Medicine | Admitting: Family Medicine

## 2016-04-05 ENCOUNTER — Inpatient Hospital Stay (HOSPITAL_COMMUNITY): Payer: Medicare Other

## 2016-04-05 DIAGNOSIS — E785 Hyperlipidemia, unspecified: Secondary | ICD-10-CM | POA: Diagnosis present

## 2016-04-05 DIAGNOSIS — J690 Pneumonitis due to inhalation of food and vomit: Secondary | ICD-10-CM | POA: Diagnosis present

## 2016-04-05 DIAGNOSIS — A419 Sepsis, unspecified organism: Secondary | ICD-10-CM | POA: Diagnosis present

## 2016-04-05 DIAGNOSIS — K5730 Diverticulosis of large intestine without perforation or abscess without bleeding: Secondary | ICD-10-CM | POA: Diagnosis present

## 2016-04-05 DIAGNOSIS — R509 Fever, unspecified: Secondary | ICD-10-CM

## 2016-04-05 DIAGNOSIS — H9190 Unspecified hearing loss, unspecified ear: Secondary | ICD-10-CM | POA: Diagnosis present

## 2016-04-05 DIAGNOSIS — D72829 Elevated white blood cell count, unspecified: Secondary | ICD-10-CM

## 2016-04-05 DIAGNOSIS — I10 Essential (primary) hypertension: Secondary | ICD-10-CM | POA: Diagnosis present

## 2016-04-05 DIAGNOSIS — F0390 Unspecified dementia without behavioral disturbance: Secondary | ICD-10-CM | POA: Diagnosis present

## 2016-04-05 DIAGNOSIS — H409 Unspecified glaucoma: Secondary | ICD-10-CM | POA: Diagnosis present

## 2016-04-05 DIAGNOSIS — R739 Hyperglycemia, unspecified: Secondary | ICD-10-CM | POA: Diagnosis present

## 2016-04-05 DIAGNOSIS — Z7982 Long term (current) use of aspirin: Secondary | ICD-10-CM | POA: Diagnosis not present

## 2016-04-05 DIAGNOSIS — F03B0 Unspecified dementia, moderate, without behavioral disturbance, psychotic disturbance, mood disturbance, and anxiety: Secondary | ICD-10-CM | POA: Diagnosis present

## 2016-04-05 DIAGNOSIS — I480 Paroxysmal atrial fibrillation: Secondary | ICD-10-CM | POA: Diagnosis present

## 2016-04-05 DIAGNOSIS — R740 Nonspecific elevation of levels of transaminase and lactic acid dehydrogenase [LDH]: Secondary | ICD-10-CM

## 2016-04-05 DIAGNOSIS — G4733 Obstructive sleep apnea (adult) (pediatric): Secondary | ICD-10-CM | POA: Diagnosis present

## 2016-04-05 DIAGNOSIS — N179 Acute kidney failure, unspecified: Secondary | ICD-10-CM | POA: Diagnosis present

## 2016-04-05 DIAGNOSIS — E872 Acidosis, unspecified: Secondary | ICD-10-CM

## 2016-04-05 DIAGNOSIS — Z66 Do not resuscitate: Secondary | ICD-10-CM | POA: Diagnosis present

## 2016-04-05 DIAGNOSIS — E8729 Other acidosis: Secondary | ICD-10-CM | POA: Diagnosis present

## 2016-04-05 DIAGNOSIS — Z96653 Presence of artificial knee joint, bilateral: Secondary | ICD-10-CM | POA: Diagnosis present

## 2016-04-05 DIAGNOSIS — I2510 Atherosclerotic heart disease of native coronary artery without angina pectoris: Secondary | ICD-10-CM | POA: Diagnosis present

## 2016-04-05 DIAGNOSIS — Z791 Long term (current) use of non-steroidal anti-inflammatories (NSAID): Secondary | ICD-10-CM

## 2016-04-05 DIAGNOSIS — E876 Hypokalemia: Secondary | ICD-10-CM | POA: Diagnosis not present

## 2016-04-05 DIAGNOSIS — R10819 Abdominal tenderness, unspecified site: Secondary | ICD-10-CM

## 2016-04-05 DIAGNOSIS — K219 Gastro-esophageal reflux disease without esophagitis: Secondary | ICD-10-CM | POA: Diagnosis present

## 2016-04-05 DIAGNOSIS — F419 Anxiety disorder, unspecified: Secondary | ICD-10-CM | POA: Diagnosis present

## 2016-04-05 DIAGNOSIS — F329 Major depressive disorder, single episode, unspecified: Secondary | ICD-10-CM | POA: Diagnosis present

## 2016-04-05 DIAGNOSIS — I248 Other forms of acute ischemic heart disease: Secondary | ICD-10-CM | POA: Diagnosis not present

## 2016-04-05 DIAGNOSIS — R6520 Severe sepsis without septic shock: Secondary | ICD-10-CM | POA: Diagnosis present

## 2016-04-05 DIAGNOSIS — R4182 Altered mental status, unspecified: Secondary | ICD-10-CM | POA: Diagnosis present

## 2016-04-05 DIAGNOSIS — J9601 Acute respiratory failure with hypoxia: Secondary | ICD-10-CM | POA: Insufficient documentation

## 2016-04-05 DIAGNOSIS — I4891 Unspecified atrial fibrillation: Secondary | ICD-10-CM | POA: Diagnosis not present

## 2016-04-05 DIAGNOSIS — E7220 Disorder of urea cycle metabolism, unspecified: Secondary | ICD-10-CM | POA: Diagnosis present

## 2016-04-05 DIAGNOSIS — Z8719 Personal history of other diseases of the digestive system: Secondary | ICD-10-CM

## 2016-04-05 DIAGNOSIS — J189 Pneumonia, unspecified organism: Secondary | ICD-10-CM

## 2016-04-05 DIAGNOSIS — R7401 Elevation of levels of liver transaminase levels: Secondary | ICD-10-CM

## 2016-04-05 DIAGNOSIS — Z9989 Dependence on other enabling machines and devices: Secondary | ICD-10-CM

## 2016-04-05 HISTORY — DX: Personal history of other diseases of the digestive system: Z87.19

## 2016-04-05 LAB — COMPREHENSIVE METABOLIC PANEL
ALT: 36 U/L (ref 14–54)
AST: 82 U/L — AB (ref 15–41)
Albumin: 3.7 g/dL (ref 3.5–5.0)
Alkaline Phosphatase: 111 U/L (ref 38–126)
Anion gap: 19 — ABNORMAL HIGH (ref 5–15)
BUN: 17 mg/dL (ref 6–20)
CHLORIDE: 102 mmol/L (ref 101–111)
CO2: 14 mmol/L — AB (ref 22–32)
CREATININE: 1.27 mg/dL — AB (ref 0.44–1.00)
Calcium: 8.9 mg/dL (ref 8.9–10.3)
GFR calc non Af Amer: 37 mL/min — ABNORMAL LOW (ref >60)
GFR, EST AFRICAN AMERICAN: 43 mL/min — AB (ref >60)
Glucose, Bld: 254 mg/dL — ABNORMAL HIGH (ref 65–99)
POTASSIUM: 4 mmol/L (ref 3.5–5.1)
SODIUM: 135 mmol/L (ref 135–145)
Total Bilirubin: 0.9 mg/dL (ref 0.3–1.2)
Total Protein: 7.8 g/dL (ref 6.5–8.1)

## 2016-04-05 LAB — CBC WITH DIFFERENTIAL/PLATELET
BASOS ABS: 0 10*3/uL (ref 0.0–0.1)
Basophils Relative: 0 %
EOS ABS: 0.1 10*3/uL (ref 0.0–0.7)
EOS PCT: 1 %
HCT: 44.7 % (ref 36.0–46.0)
Hemoglobin: 13.8 g/dL (ref 12.0–15.0)
Lymphocytes Relative: 24 %
Lymphs Abs: 4.2 10*3/uL — ABNORMAL HIGH (ref 0.7–4.0)
MCH: 28 pg (ref 26.0–34.0)
MCHC: 30.9 g/dL (ref 30.0–36.0)
MCV: 90.9 fL (ref 78.0–100.0)
Monocytes Absolute: 1.5 10*3/uL — ABNORMAL HIGH (ref 0.1–1.0)
Monocytes Relative: 9 %
Neutro Abs: 11.7 10*3/uL — ABNORMAL HIGH (ref 1.7–7.7)
Neutrophils Relative %: 66 %
PLATELETS: 385 10*3/uL (ref 150–400)
RBC: 4.92 MIL/uL (ref 3.87–5.11)
RDW: 15.8 % — AB (ref 11.5–15.5)
WBC: 17.5 10*3/uL — AB (ref 4.0–10.5)

## 2016-04-05 LAB — URINALYSIS, ROUTINE W REFLEX MICROSCOPIC
BILIRUBIN URINE: NEGATIVE
Glucose, UA: NEGATIVE mg/dL
Ketones, ur: NEGATIVE mg/dL
Leukocytes, UA: NEGATIVE
NITRITE: NEGATIVE
PH: 6 (ref 5.0–8.0)
Protein, ur: 100 mg/dL — AB
SPECIFIC GRAVITY, URINE: 1.018 (ref 1.005–1.030)

## 2016-04-05 LAB — I-STAT CHEM 8, ED
BUN: 21 mg/dL — AB (ref 6–20)
CHLORIDE: 108 mmol/L (ref 101–111)
CREATININE: 1 mg/dL (ref 0.44–1.00)
Calcium, Ion: 0.86 mmol/L — CL (ref 1.15–1.40)
Glucose, Bld: 249 mg/dL — ABNORMAL HIGH (ref 65–99)
HEMATOCRIT: 47 % — AB (ref 36.0–46.0)
Hemoglobin: 16 g/dL — ABNORMAL HIGH (ref 12.0–15.0)
Potassium: 4.1 mmol/L (ref 3.5–5.1)
Sodium: 136 mmol/L (ref 135–145)
TCO2: 15 mmol/L (ref 0–100)

## 2016-04-05 LAB — I-STAT ARTERIAL BLOOD GAS, ED
Acid-base deficit: 7 mmol/L — ABNORMAL HIGH (ref 0.0–2.0)
Bicarbonate: 19.8 mmol/L — ABNORMAL LOW (ref 20.0–28.0)
O2 Saturation: 86 %
PCO2 ART: 46.7 mmHg (ref 32.0–48.0)
PH ART: 7.244 — AB (ref 7.350–7.450)
TCO2: 21 mmol/L (ref 0–100)
pO2, Arterial: 67 mmHg — ABNORMAL LOW (ref 83.0–108.0)

## 2016-04-05 LAB — PROTIME-INR
INR: 1.2
PROTHROMBIN TIME: 15.3 s — AB (ref 11.4–15.2)

## 2016-04-05 LAB — I-STAT TROPONIN, ED: TROPONIN I, POC: 0.01 ng/mL (ref 0.00–0.08)

## 2016-04-05 LAB — I-STAT CG4 LACTIC ACID, ED: Lactic Acid, Venous: 9.75 mmol/L (ref 0.5–1.9)

## 2016-04-05 LAB — APTT: APTT: 29 s (ref 24–36)

## 2016-04-05 LAB — URINE MICROSCOPIC-ADD ON

## 2016-04-05 LAB — CBG MONITORING, ED: GLUCOSE-CAPILLARY: 176 mg/dL — AB (ref 65–99)

## 2016-04-05 LAB — CK: CK TOTAL: 132 U/L (ref 38–234)

## 2016-04-05 LAB — TROPONIN I: TROPONIN I: 0.16 ng/mL — AB (ref <0.03)

## 2016-04-05 LAB — PROCALCITONIN: PROCALCITONIN: 1.53 ng/mL

## 2016-04-05 LAB — LACTIC ACID, PLASMA: Lactic Acid, Venous: 3.5 mmol/L (ref 0.5–1.9)

## 2016-04-05 LAB — AMMONIA: AMMONIA: 72 umol/L — AB (ref 9–35)

## 2016-04-05 MED ORDER — ATORVASTATIN CALCIUM 40 MG PO TABS
40.0000 mg | ORAL_TABLET | Freq: Every day | ORAL | Status: DC
Start: 2016-04-06 — End: 2016-04-09
  Administered 2016-04-06 – 2016-04-09 (×4): 40 mg via ORAL
  Filled 2016-04-05 (×4): qty 1

## 2016-04-05 MED ORDER — VANCOMYCIN HCL IN DEXTROSE 1-5 GM/200ML-% IV SOLN
1000.0000 mg | INTRAVENOUS | Status: AC
  Administered 2016-04-06: 1000 mg via INTRAVENOUS
  Filled 2016-04-05 (×2): qty 200

## 2016-04-05 MED ORDER — ADULT MULTIVITAMIN W/MINERALS CH
1.0000 | ORAL_TABLET | Freq: Every day | ORAL | Status: DC
  Administered 2016-04-06 – 2016-04-09 (×4): 1 via ORAL
  Filled 2016-04-05 (×4): qty 1

## 2016-04-05 MED ORDER — SODIUM CHLORIDE 0.9 % IV BOLUS (SEPSIS)
1000.0000 mL | Freq: Once | INTRAVENOUS | Status: AC
  Administered 2016-04-05: 1000 mL via INTRAVENOUS

## 2016-04-05 MED ORDER — BOOST HIGH PROTEIN PO LIQD
1.0000 | Freq: Two times a day (BID) | ORAL | Status: DC
  Administered 2016-04-06 – 2016-04-09 (×7): 237 mL via ORAL
  Filled 2016-04-05 (×10): qty 237

## 2016-04-05 MED ORDER — MEMANTINE HCL ER 28 MG PO CP24
28.0000 mg | ORAL_CAPSULE | Freq: Every day | ORAL | Status: DC
  Administered 2016-04-06 – 2016-04-09 (×4): 28 mg via ORAL
  Filled 2016-04-05 (×4): qty 1

## 2016-04-05 MED ORDER — SODIUM CHLORIDE 0.9 % IV SOLN
INTRAVENOUS | Status: DC
  Administered 2016-04-05: 22:00:00 via INTRAVENOUS

## 2016-04-05 MED ORDER — PIPERACILLIN-TAZOBACTAM 3.375 G IVPB 30 MIN
3.3750 g | Freq: Once | INTRAVENOUS | Status: AC
  Administered 2016-04-05: 3.375 g via INTRAVENOUS
  Filled 2016-04-05: qty 50

## 2016-04-05 MED ORDER — DONEPEZIL HCL 5 MG PO TABS
5.0000 mg | ORAL_TABLET | Freq: Every day | ORAL | Status: DC
  Administered 2016-04-06 – 2016-04-08 (×3): 5 mg via ORAL
  Filled 2016-04-05 (×4): qty 1

## 2016-04-05 MED ORDER — SERTRALINE HCL 50 MG PO TABS
50.0000 mg | ORAL_TABLET | Freq: Every day | ORAL | Status: DC
  Administered 2016-04-06 – 2016-04-09 (×4): 50 mg via ORAL
  Filled 2016-04-05 (×4): qty 1

## 2016-04-05 MED ORDER — POLYETHYLENE GLYCOL 3350 17 G PO PACK: 17.0000 g | PACK | Freq: Every day | ORAL | Status: DC | PRN

## 2016-04-05 MED ORDER — ACETAMINOPHEN 650 MG RE SUPP: 650.0000 mg | Freq: Four times a day (QID) | RECTAL | Status: DC | PRN

## 2016-04-05 MED ORDER — LACTULOSE 10 GM/15ML PO SOLN
20.0000 g | Freq: Two times a day (BID) | ORAL | Status: DC
  Administered 2016-04-06 – 2016-04-09 (×6): 20 g via ORAL
  Filled 2016-04-05 (×7): qty 30

## 2016-04-05 MED ORDER — BISACODYL 5 MG PO TBEC
5.0000 mg | DELAYED_RELEASE_TABLET | Freq: Every day | ORAL | Status: DC | PRN
  Filled 2016-04-05: qty 1

## 2016-04-05 MED ORDER — VANCOMYCIN HCL IN DEXTROSE 1-5 GM/200ML-% IV SOLN: 1000.0000 mg | Freq: Once | INTRAVENOUS | Status: DC

## 2016-04-05 MED ORDER — HYDROCODONE-ACETAMINOPHEN 5-325 MG PO TABS: 1.0000 | ORAL_TABLET | ORAL | Status: DC | PRN

## 2016-04-05 MED ORDER — INSULIN ASPART 100 UNIT/ML ~~LOC~~ SOLN
0.0000 [IU] | SUBCUTANEOUS | Status: DC
  Administered 2016-04-06: 1 [IU] via SUBCUTANEOUS

## 2016-04-05 MED ORDER — LATANOPROST 0.005 % OP SOLN
1.0000 [drp] | Freq: Every day | OPHTHALMIC | Status: DC
  Administered 2016-04-06 – 2016-04-08 (×2): 1 [drp] via OPHTHALMIC
  Filled 2016-04-05: qty 2.5

## 2016-04-05 MED ORDER — ONDANSETRON HCL 4 MG/2ML IJ SOLN
INTRAMUSCULAR | Status: AC
  Administered 2016-04-05: 4 mg
  Filled 2016-04-05: qty 2

## 2016-04-05 MED ORDER — ALBUTEROL SULFATE (2.5 MG/3ML) 0.083% IN NEBU
INHALATION_SOLUTION | RESPIRATORY_TRACT | Status: AC
Start: 2016-04-05 — End: 2016-04-06
  Filled 2016-04-05: qty 3

## 2016-04-05 MED ORDER — ONDANSETRON HCL 4 MG/2ML IJ SOLN
4.0000 mg | Freq: Four times a day (QID) | INTRAMUSCULAR | Status: DC | PRN
Start: 2016-04-05 — End: 2016-04-09

## 2016-04-05 MED ORDER — PIPERACILLIN-TAZOBACTAM 3.375 G IVPB
3.3750 g | Freq: Three times a day (TID) | INTRAVENOUS | Status: AC
  Administered 2016-04-06 – 2016-04-08 (×9): 3.375 g via INTRAVENOUS
  Filled 2016-04-05 (×10): qty 50

## 2016-04-05 MED ORDER — ONDANSETRON HCL 4 MG PO TABS: 4.0000 mg | ORAL_TABLET | Freq: Four times a day (QID) | ORAL | Status: DC | PRN

## 2016-04-05 MED ORDER — ACETAMINOPHEN 325 MG PO TABS
650.0000 mg | ORAL_TABLET | Freq: Four times a day (QID) | ORAL | Status: DC | PRN
  Administered 2016-04-06: 650 mg via ORAL
  Filled 2016-04-05: qty 2

## 2016-04-05 MED ORDER — PANTOPRAZOLE SODIUM 40 MG PO TBEC
40.0000 mg | DELAYED_RELEASE_TABLET | Freq: Every day | ORAL | Status: DC
  Administered 2016-04-06 – 2016-04-09 (×4): 40 mg via ORAL
  Filled 2016-04-05 (×4): qty 1

## 2016-04-05 MED ORDER — VANCOMYCIN HCL IN DEXTROSE 1-5 GM/200ML-% IV SOLN
1000.0000 mg | Freq: Once | INTRAVENOUS | Status: AC
  Administered 2016-04-05: 1000 mg via INTRAVENOUS
  Filled 2016-04-05: qty 200

## 2016-04-05 MED ORDER — HEPARIN SODIUM (PORCINE) 5000 UNIT/ML IJ SOLN
5000.0000 [IU] | Freq: Three times a day (TID) | INTRAMUSCULAR | Status: DC
  Administered 2016-04-06: 5000 [IU] via SUBCUTANEOUS
  Filled 2016-04-05: qty 1

## 2016-04-05 MED ORDER — PIPERACILLIN-TAZOBACTAM 3.375 G IVPB 30 MIN: 3.3750 g | Freq: Once | INTRAVENOUS | Status: DC

## 2016-04-05 MED ORDER — LABETALOL HCL 5 MG/ML IV SOLN
5.0000 mg | INTRAVENOUS | Status: DC | PRN
  Administered 2016-04-06 – 2016-04-08 (×3): 5 mg via INTRAVENOUS
  Filled 2016-04-05 (×3): qty 4

## 2016-04-05 MED ORDER — ALBUTEROL SULFATE (2.5 MG/3ML) 0.083% IN NEBU
5.0000 mg | INHALATION_SOLUTION | Freq: Once | RESPIRATORY_TRACT | Status: AC
Start: 2016-04-05 — End: 2016-04-05
  Administered 2016-04-05: 5 mg via RESPIRATORY_TRACT

## 2016-04-05 MED ORDER — SODIUM CHLORIDE 0.9% FLUSH
3.0000 mL | Freq: Two times a day (BID) | INTRAVENOUS | Status: DC
  Administered 2016-04-06 – 2016-04-08 (×4): 3 mL via INTRAVENOUS

## 2016-04-05 MED ORDER — SODIUM CHLORIDE 0.9 % IV SOLN
INTRAVENOUS | Status: AC
  Administered 2016-04-06: 01:00:00 via INTRAVENOUS

## 2016-04-05 MED ORDER — ASPIRIN 300 MG RE SUPP
300.0000 mg | Freq: Once | RECTAL | Status: AC
  Administered 2016-04-06: 300 mg via RECTAL
  Filled 2016-04-05: qty 1

## 2016-04-05 MED ORDER — ASPIRIN EC 81 MG PO TBEC
81.0000 mg | DELAYED_RELEASE_TABLET | Freq: Every day | ORAL | Status: DC
  Administered 2016-04-06 – 2016-04-09 (×4): 81 mg via ORAL
  Filled 2016-04-05 (×4): qty 1

## 2016-04-05 NOTE — Progress Notes (Signed)
Pharmacy Antibiotic Note  [name] is a 82 y.o. female admitted on 04/05/2016 with sepsis.  Pharmacy has been consulted for vancomycin and zosyn dosing. Patient admitted from SNF after being found unresponsive with agonal respirations. On admission, patient more alert and vomiting. Temperature 101.6, WBC 17.5, and lactic acid 9.75. Of note, patient has allergy to penicillin. Dr. Clydene PughKnott notified of allergy and wishes to proceed with Zosyn at this time.   Plan: Vancomycin 1g IV every 24 hours, with goal trough 15-20 mcg/mL Zosyn 3.375g IV (30 min infusion) x1, then 3.375g IV (4 hr infusion) every 8 hours Monitor renal function and clinical picture Follow-up cultures and length of therapy  Vancomycin trough at steady state and as needed  Monitor for signs and symptoms of allergic response (itching/rash)   Height: 5\' 4"  (162.6 cm) Weight: 140 lb (63.5 kg) IBW/kg (Calculated) : 54.7  Temp (24hrs), Avg:100.1 F (37.8 C), Min:98.5 F (36.9 C), Max:101.6 F (38.7 C)   Recent Labs Lab 04/05/16 2014 04/05/16 2024  WBC 17.5*  --   CREATININE  --  1.00  LATICACIDVEN  --  9.75*    Estimated Creatinine Clearance: 34.9 mL/min (by C-G formula based on SCr of 1 mg/dL).    Allergies  Allergen Reactions  . Amoxicillin Itching    Chest and back  . Cephalexin Itching    Chest and back  . Penicillins Itching    Chest and back    Antimicrobials this admission: 9/1 Vanc >>  9/1 Zosyn >>   Dose adjustments this admission: N/A  Microbiology results: 9/1 BCx: pending  9/1 UCx: pending   Thank you for allowing pharmacy to be a part of this patient's care.  York CeriseKatherine Cook, PharmD Pharmacy Resident  Pager [phone] 04/05/16 9:08 PM

## 2016-04-05 NOTE — ED Notes (Signed)
Patient transported to Ultrasound 

## 2016-04-05 NOTE — ED Triage Notes (Signed)
Pt comes from [facility], found unresponsive with agonal respirations, pinpoint pupils, pt given 2mg  narcan PTA, pt began vomiting and became responsive to pain.

## 2016-04-05 NOTE — ED Notes (Signed)
Admitting at bedside 

## 2016-04-05 NOTE — H&P (Signed)
History and Physical    [name] ZOX:[id number] DOB: 01/15/1928 DOA: 04/05/2016  PCP: Jenny Jenny CanadaLingerhomas Jones, MD   Patient [facility]   Chief Complaint: Poorly responsive with agonal respirations   HPI: [name] is a 82 y.o. female with medical history significant for coronary artery disease, hypertension, GERD, depression, and dementia who presents from her nursing home after being found unresponsive and with agonal respirations. Patient had reportedly been in her usual state of health at the nursing facility as late as this morning, but was later found unresponsive with agonal respirations and pinpoint pupils. 2 mg of Narcan were administered in the field and the patient began vomiting. EMS reported the patient to be vomiting and apparently aspirating on arrival. She was brought into the ED for further evaluation. Patient is unable to contribute much to the history given her dementia, and history is therefore obtained from discussion with the ED personnel, nursing home personnel, review of the EMR, and telephone discussion with the patient's legal guardian.   ED Course: Upon arrival to the ED, patient is found to be febrile to 38.7 C, saturating 100% on 4 L/m supplemental oxygen, tachypneic in the 30s, and with vitals otherwise stable. EKG demonstrated a sinus rhythm with LVH by voltage criteria and a secondary repolarization abnormality, as well as nonspecific T-wave changes in the lateral leads. Chest x-ray is notable for cardiomegaly with slight vascular congestion and a mild opacity in the right lower lobe consistent with atelectasis or pneumonia. Chemistry panel features a serum creatinine 1.27, up from an apparent baseline of 0.9. Also noted on chemistry panel is a serum bicarbonate of 14 with anion gap elevated to 19, and hyperglycemia to 254. Ammonia level is elevated to 72 and CK is normal. CBC is remarkable for leukocytosis to 17,500 and lactic acid is markedly  elevated to 9.75. Troponin is within the normal limits at 0.01. ABG reveals a pH of 7.24, pCO2 of 47, and pO2 of 67. Urinalysis is not suggestive of infection. Patient was given a 3 L normal saline bolus in the emergency department and urine and blood cultures were obtained. She was treated with DuoNeb and empiric doses of vancomycin and Zosyn. Patient's work of breathing has improved and she has remained hemodynamically stable. She will be admitted to the stepdown unit for ongoing evaluation and management of sepsis suspected secondary to HCAP.   Review of Systems:  All other systems reviewed and apart from HPI, are negative.  Past Medical History:  Diagnosis Date  . Abnormality of gait   . Anemia   . Anxiety   . Bronchitis, mucopurulent recurrent (HCC)   . CAD (coronary artery disease)   . Chronic osteomyelitis, other specified site   . Depression   . Diverticulosis of colon   . DJD (degenerative joint disease)   . Dysrhythmia    OCC HEART PALPITATION DR [name]  . GERD (gastroesophageal reflux disease)   . Glaucoma   . Hearing loss   . Hiatal hernia   . Hx of adenomatous colonic polyps   . Hypercholesteremia   . Hypertension   . Lumbar back pain   . OSA (obstructive sleep apnea)    CPAP  MANY YRS AGO NOT SURE WHERE DONE  . Palpitations   . Personal history of other diseases of digestive system   . Thyroid nodule   . Vitamin D deficiency     Past Surgical History:  Procedure Laterality Date  . ABDOMINAL HYSTERECTOMY    .  APPENDECTOMY    . CATARACT EXTRACTION    . CHOLECYSTECTOMY    . INGUINAL HERNIA REPAIR  05/21/2012   WITH MESH   . TOTAL KNEE ARTHROPLASTY     left  . TOTAL KNEE ARTHROPLASTY     right  . VENTRAL HERNIA REPAIR  05/21/2012   Procedure: LAPAROSCOPIC VENTRAL HERNIA;  Surgeon: Jenny Ina, MD,FACS;  Location: [facility] OR;  Service: General;  Laterality: N/A;     reports that she has never smoked. She has never used smokeless tobacco. She reports that she  does not drink alcohol or use drugs.  Allergies  Allergen Reactions  . Amoxicillin Itching    Chest and back  . Cephalexin Itching    Chest and back  . Penicillins Itching    Chest and back    Family History  Problem Relation Age of Onset  . Family history unknown: Yes     Prior to Admission medications   Medication Sig Start Date End Date Taking? Authorizing Provider  aspirin 81 MG tablet Take 81 mg by mouth daily. Reported on 01/17/2016    Historical Provider, MD  atorvastatin (LIPITOR) 40 MG tablet Take 1 tablet (40 mg total) by mouth daily. 01/25/16   Etta Grandchild, MD  donepezil (ARICEPT) 5 MG tablet Take 1 tablet (5 mg total) by mouth at bedtime. Reported on 01/17/2016 01/25/16   Etta Grandchild, MD  esomeprazole (NEXIUM) 40 MG capsule Take 1 capsule (40 mg total) by mouth daily. 01/25/16   Etta Grandchild, MD  feeding supplement (BOOST HIGH PROTEIN) LIQD Take 237 mLs by mouth 2 (two) times daily between meals. 06/21/15   Etta Grandchild, MD  felodipine (PLENDIL) 5 MG 24 hr tablet Take 1 tablet (5 mg total) by mouth daily. TAKE 1 TABLET (5 MG TOTAL) BY MOUTH DAILY. 01/25/16   Etta Grandchild, MD  latanoprost (XALATAN) 0.005 % ophthalmic solution  12/21/15   Historical Provider, MD  meloxicam (MOBIC) 7.5 MG tablet Take 1 tablet (7.5 mg total) by mouth daily. 01/25/16   Etta Grandchild, MD  memantine (NAMENDA XR) 28 MG CP24 24 hr capsule After you finish starter pack, start taking 1 tablet daily 01/25/16   Etta Grandchild, MD  metoprolol succinate (TOPROL-XL) 50 MG 24 hr tablet Take 1 tablet (50 mg total) by mouth daily. Take with or immediately following a meal. 01/25/16   Etta Grandchild, MD  Multiple Vitamins-Minerals (CENTRUM SILVER ULTRA WOMENS PO) Take 1 tablet by mouth daily.    Historical Provider, MD  sertraline (ZOLOFT) 50 MG tablet Take 1 tablet (50 mg total) by mouth daily. 01/25/16   Etta Grandchild, MD    Physical Exam: Vitals:   04/05/16 2048 04/05/16 2100 04/05/16 2115 04/05/16  2130  BP:  144/71 135/70 138/76  Pulse:  77 76 77  Resp:  17 18 22   Temp: 101.6 F (38.7 C)     TempSrc: Rectal     SpO2:  99% 100% 100%  Weight:      Height:          Constitutional: Calm, chronically-ill appearing Eyes: PERTLA, lids and conjunctivae normal ENMT: Mucous membranes are dry. Posterior pharynx clear of any exudate or lesions.   Neck: normal, supple, no masses, no thyromegaly Respiratory: Coarse rhonchi at both bases, accessory muscle recruitment. No pallor or cyanosis.  Cardiovascular: S1 & S2 heard, regular rate and rhythm, no significant murmur. Trace b/l pretibial edema. No carotid bruits. No significant JVD.  Abdomen: No distension, mild tenderness in right quadrants, no masses palpated. Bowel sounds normal.  Musculoskeletal: no clubbing / cyanosis. No joint deformity upper and lower extremities. Normal muscle tone.  Skin: no significant rashes, lesions, ulcers. Warm, dry, well-perfused. Neurologic: CN 2-12 grossly intact. Sensation intact, DTR normal. Strength 5/5 in all 4 limbs.  Psychiatric: Alert, oriented to person only. Calm, cooperative, pleasant. Normal mood and affect.     Labs on Admission: I have personally reviewed following labs and imaging studies  CBC:  Recent Labs Lab 04/05/16 2014 04/05/16 2024  WBC 17.5*  --   NEUTROABS 11.7*  --   HGB 13.8 16.0*  HCT 44.7 47.0*  MCV 90.9  --   PLT 385  --    Basic Metabolic Panel:  Recent Labs Lab 04/05/16 2014 04/05/16 2024  NA 135 136  K 4.0 4.1  CL 102 108  CO2 14*  --   GLUCOSE 254* 249*  BUN 17 21*  CREATININE 1.27* 1.00  CALCIUM 8.9  --    GFR: Estimated Creatinine Clearance: 34.9 mL/min (by C-G formula based on SCr of 1 mg/dL). Liver Function Tests:  Recent Labs Lab 04/05/16 2014  AST 82*  ALT 36  ALKPHOS 111  BILITOT 0.9  PROT 7.8  ALBUMIN 3.7   No results for input(s): LIPASE, AMYLASE in the last 168 hours.  Recent Labs Lab 04/05/16 2015  AMMONIA 72*    Coagulation Profile: No results for input(s): INR, PROTIME in the last 168 hours. Cardiac Enzymes:  Recent Labs Lab 04/05/16 2014  CKTOTAL 132   BNP (last 3 results) No results for input(s): PROBNP in the last 8760 hours. HbA1C: No results for input(s): HGBA1C in the last 72 hours. CBG:  Recent Labs Lab 04/05/16 2025  GLUCAP 176*   Lipid Profile: No results for input(s): CHOL, HDL, LDLCALC, TRIG, CHOLHDL, LDLDIRECT in the last 72 hours. Thyroid Function Tests: No results for input(s): TSH, T4TOTAL, FREET4, T3FREE, THYROIDAB in the last 72 hours. Anemia Panel: No results for input(s): VITAMINB12, FOLATE, FERRITIN, TIBC, IRON, RETICCTPCT in the last 72 hours. Urine analysis:    Component Value Date/Time   COLORURINE YELLOW 04/05/2016 2044   APPEARANCEUR CLEAR 04/05/2016 2044   LABSPEC 1.018 04/05/2016 2044   PHURINE 6.0 04/05/2016 2044   GLUCOSEU NEGATIVE 04/05/2016 2044   GLUCOSEU NEGATIVE 11/14/2014 1047   HGBUR MODERATE (A) 04/05/2016 2044   BILIRUBINUR NEGATIVE 04/05/2016 2044   KETONESUR NEGATIVE 04/05/2016 2044   PROTEINUR 100 (A) 04/05/2016 2044   UROBILINOGEN 0.2 11/14/2014 1047   NITRITE NEGATIVE 04/05/2016 2044   LEUKOCYTESUR NEGATIVE 04/05/2016 2044   Sepsis Labs: @LABRCNTIP (procalcitonin:4,lacticidven:4) )No results found for this or any previous visit (from the past 240 hour(s)).   Radiological Exams on Admission: Dg Chest Port 1 View  Result Date: 04/05/2016 CLINICAL DATA:  Found unresponsive,agonal respiration, pinpoint pupils, began vomiting and became responsive to pain after Narcan, history hypertension, coronary artery disease EXAM: PORTABLE CHEST 1 VIEW COMPARISON:  Portable exam 2020 hours compared to 05/13/2012 FINDINGS: Enlargement of cardiac silhouette with slight vascular congestion. Atherosclerotic calcification thoracic aorta. Minimal RIGHT basilar atelectasis versus infiltrate. Remaining lungs clear. No pleural effusion or pneumothorax.  BILATERAL glenohumeral degenerative changes. IMPRESSION: Enlargement of cardiac silhouette with slight vascular congestion. Mild atelectasis versus infiltrate RIGHT lower lobe. Aortic atherosclerosis. Electronically Signed   By: Ulyses Southward M.D.   On: 04/05/2016 20:29    EKG: Independently reviewed. Sinus rhythm, LVH by voltage criteria with secondary repolarization abnormality, non-specific T-wave changes  in lateral leads  Assessment/Plan  1. Severe sepsis, suspected secondary to HCAP  - Meets criteria for severe sepsis on presentation and suspected source is respiratory given the acute hypoxic respiratory failure and RLL infiltrate; aspiration event likey  - Blood and urine cultures are incubating  - 30 cc/kg NS bolus given  - Lactate markedly elevated to 9.75 initially, trending  - Empiric abx with vancomycin and Zosyn initiated in ED, will continue given suspected HCAP with aspiration concern; vanc can likely be discontinued in 24-48 hr if MRSA screen negative  - Check sputum GS and culture; check urine for strep pneumo and legionella antigens; trend PCT  - Monitor in stepdown with continuous pulse oximetry and titration of FiO2 to maintain sat >92%    2. AKI  - SCr 1.27 on admission, up from apparent baseline of 0.9  - Likely a prerenal azotemia in setting of sepsis  - She has received sepsis fluids  - Avoid nephrotoxins, hold her Mobic, repeat chem panel in am   3. Metabolic acidosis, elevated AG   - pH 7.24, pCO2 47, pO2 67, serum bicarb 14, AG 19  - Likely secondary to sepsis  - Anticipate resolution with aggressive IVF and abx  - Repeat chem panel in am; repeat ABG prn respiratory decline    4. Hyperglycemia  - Serum glucose 254 on admission with no hx of DM  - Likely secondary to sepsis  - Will check CBG q4h for now and institute and low-intensity sliding-scale correctional  - Check A1c, ordered   5. Hypertension  - BP has been at goal since admission - Managed with  felodipine and metoprolol at the nursing home  - Hold the scheduled antihypertensives for now in setting of severe sepsis and concern for possible progression to shock  - Labetalol IVP's will be available prn    6. GERD  - Managed with Nexium at the nursing home  - EGD from 2013 with small hiatal hernia, no esophagitis  - Continue PPI with Protonix while in hospital   7. OSA - Continue CPAP qHS    8. Dementia - Followed by neurology in outpatient setting, suspected to have Alzheimer's, and has been transitioning to a memory unit at the NH  - Continue Aricept, Namenda    9. CAD  - No anginal complaints - Admission EKG with some non-specific T-wave changes in the lateral leads  - Troponin 0.01  - Monitoring on telemetry - Continue Lipitor and ASA 81     DVT prophylaxis: sq heparin Code Status: DNR, confirmed with legal guardian at time of admission Family Communication: Updated legal guardian, Barbette Merino by phone at [phone] Disposition Plan: Admit to stepdown   Consults called: None Admission status: Inpatient    Briscoe Deutscher, MD [facility] Pager [phone]  If 7PM-7AM, please contact night-coverage [email or web address] Password [facility]  04/05/2016, 9:54 PM

## 2016-04-05 NOTE — ED Provider Notes (Addendum)
[facility]-EMERGENCY DEPT Provider Note   CSN: [id number] Arrival date & time: 04/05/16  2005     History   Chief Complaint Chief Complaint  Patient presents with  . Other    unresponsive    HPI [name] is a 82 y.o. female.  The history is provided by the EMS personnel.  Altered Mental Status   This is a new problem. Episode onset: unknown. The problem has not changed since onset.Associated symptoms include confusion and somnolence. Associated symptoms comments: Agonal breathing. Risk factors: elderly. Her past medical history is significant for dementia.    Past Medical History:  Diagnosis Date  . Abnormality of gait   . Anemia   . Anxiety   . Bronchitis, mucopurulent recurrent (HCC)   . CAD (coronary artery disease)   . Chronic osteomyelitis, other specified site   . Depression   . Diverticulosis of colon   . DJD (degenerative joint disease)   . Dysrhythmia    OCC HEART PALPITATION DR [name]  . GERD (gastroesophageal reflux disease)   . Glaucoma   . Hearing loss   . Hiatal hernia   . Hx of adenomatous colonic polyps   . Hypercholesteremia   . Hypertension   . Lumbar back pain   . OSA (obstructive sleep apnea)    CPAP  MANY YRS AGO NOT SURE WHERE DONE  . Palpitations   . Personal history of other diseases of digestive system   . Thyroid nodule   . Vitamin D deficiency     Patient Active Problem List   Diagnosis Date Noted  . Depression due to dementia 01/17/2016  . Moderate dementia without behavioral disturbance 08/29/2015  . Routine general medical examination at a health care facility 12/09/2013  . OSA on CPAP 05/25/2012  . Vitamin D deficiency 08/29/2008  . GERD 03/10/2008  . Osteoarthritis 09/02/2007  . Low back pain with left-sided sciatica 09/02/2007  . Hyperlipidemia with target LDL less than 100 09/01/2007  . Essential hypertension 09/01/2007  . Coronary atherosclerosis 09/01/2007    Past Surgical History:  Procedure Laterality Date  .  ABDOMINAL HYSTERECTOMY    . APPENDECTOMY    . CATARACT EXTRACTION    . CHOLECYSTECTOMY    . INGUINAL HERNIA REPAIR  05/21/2012   WITH MESH   . TOTAL KNEE ARTHROPLASTY     left  . TOTAL KNEE ARTHROPLASTY     right  . VENTRAL HERNIA REPAIR  05/21/2012   Procedure: LAPAROSCOPIC VENTRAL HERNIA;  Surgeon: Atilano Ina, MD,FACS;  Location: [facility] OR;  Service: General;  Laterality: N/A;    OB History    No data available       Home Medications    Prior to Admission medications   Medication Sig Start Date End Date Taking? Authorizing Provider  aspirin 81 MG tablet Take 81 mg by mouth daily. Reported on 01/17/2016    Historical Provider, MD  atorvastatin (LIPITOR) 40 MG tablet Take 1 tablet (40 mg total) by mouth daily. 01/25/16   Etta Grandchild, MD  donepezil (ARICEPT) 5 MG tablet Take 1 tablet (5 mg total) by mouth at bedtime. Reported on 01/17/2016 01/25/16   Etta Grandchild, MD  esomeprazole (NEXIUM) 40 MG capsule Take 1 capsule (40 mg total) by mouth daily. 01/25/16   Etta Grandchild, MD  feeding supplement (BOOST HIGH PROTEIN) LIQD Take 237 mLs by mouth 2 (two) times daily between meals. 06/21/15   Etta Grandchild, MD  felodipine (PLENDIL) 5 MG 24  hr tablet Take 1 tablet (5 mg total) by mouth daily. TAKE 1 TABLET (5 MG TOTAL) BY MOUTH DAILY. 01/25/16   Etta Grandchild, MD  latanoprost (XALATAN) 0.005 % ophthalmic solution  12/21/15   Historical Provider, MD  meloxicam (MOBIC) 7.5 MG tablet Take 1 tablet (7.5 mg total) by mouth daily. 01/25/16   Etta Grandchild, MD  memantine (NAMENDA XR) 28 MG CP24 24 hr capsule After you finish starter pack, start taking 1 tablet daily 01/25/16   Etta Grandchild, MD  metoprolol succinate (TOPROL-XL) 50 MG 24 hr tablet Take 1 tablet (50 mg total) by mouth daily. Take with or immediately following a meal. 01/25/16   Etta Grandchild, MD  Multiple Vitamins-Minerals (CENTRUM SILVER ULTRA WOMENS PO) Take 1 tablet by mouth daily.    Historical Provider, MD  sertraline  (ZOLOFT) 50 MG tablet Take 1 tablet (50 mg total) by mouth daily. 01/25/16   Etta Grandchild, MD    Family History No family history on file.  Social History Social History  Substance Use Topics  . Smoking status: Never Smoker  . Smokeless tobacco: Never Used  . Alcohol use No     Allergies   Amoxicillin; Cephalexin; and Penicillins   Review of Systems Review of Systems  Unable to perform ROS: Dementia  Psychiatric/Behavioral: Positive for confusion.  Level 5 exception to history: dementia   Physical Exam Updated Vital Signs BP 136/70   Pulse 83   Temp 98.5 F (36.9 C) (Axillary)   Resp (!) 32   Ht 5\' 4"  (1.626 m)   Wt 140 lb (63.5 kg)   SpO2 100%   BMI 24.03 kg/m   Physical Exam  Constitutional: She appears lethargic. She appears ill.  Covered in vomit  HENT:  Head: Normocephalic.  Eyes: Conjunctivae are normal.  Neck: Neck supple. No tracheal deviation present.  Cardiovascular: Normal rate, regular rhythm and normal heart sounds.   No murmur heard. Pulmonary/Chest: Tachypnea noted. She has wheezes (R>L with coarse lung sounds).  Abdominal: Soft. She exhibits no distension.  Neurological: She appears lethargic. She is disoriented. GCS eye subscore is 4. GCS verbal subscore is 4. GCS motor subscore is 4.  Skin: Skin is warm and dry.  Psychiatric: Her affect is blunt.     ED Treatments / Results  Labs (all labs ordered are listed, but only abnormal results are displayed) Labs Reviewed  CBC WITH DIFFERENTIAL/PLATELET - Abnormal; Notable for the following:       Result Value   WBC 17.5 (*)    RDW 15.8 (*)    Neutro Abs 11.7 (*)    Lymphs Abs 4.2 (*)    Monocytes Absolute 1.5 (*)    All other components within normal limits  COMPREHENSIVE METABOLIC PANEL - Abnormal; Notable for the following:    CO2 14 (*)    Glucose, Bld 254 (*)    Creatinine, Ser 1.27 (*)    AST 82 (*)    GFR calc non Af Amer 37 (*)    GFR calc Af Amer 43 (*)    Anion gap 19 (*)     All other components within normal limits  URINALYSIS, ROUTINE W REFLEX MICROSCOPIC (NOT AT [facility]) - Abnormal; Notable for the following:    Hgb urine dipstick MODERATE (*)    Protein, ur 100 (*)    All other components within normal limits  AMMONIA - Abnormal; Notable for the following:    Ammonia 72 (*)  All other components within normal limits  URINE MICROSCOPIC-ADD ON - Abnormal; Notable for the following:    Squamous Epithelial / LPF 0-5 (*)    Bacteria, UA RARE (*)    All other components within normal limits  LACTIC ACID, PLASMA - Abnormal; Notable for the following:    Lactic Acid, Venous 3.5 (*)    All other components within normal limits  PROTIME-INR - Abnormal; Notable for the following:    Prothrombin Time 15.3 (*)    All other components within normal limits  TROPONIN I - Abnormal; Notable for the following:    Troponin I 0.16 (*)    All other components within normal limits  GLUCOSE, CAPILLARY - Abnormal; Notable for the following:    Glucose-Capillary 122 (*)    All other components within normal limits  CBG MONITORING, ED - Abnormal; Notable for the following:    Glucose-Capillary 176 (*)    All other components within normal limits  I-STAT CHEM 8, ED - Abnormal; Notable for the following:    BUN 21 (*)    Glucose, Bld 249 (*)    Calcium, Ion 0.86 (*)    Hemoglobin 16.0 (*)    HCT 47.0 (*)    All other components within normal limits  I-STAT CG4 LACTIC ACID, ED - Abnormal; Notable for the following:    Lactic Acid, Venous 9.75 (*)    All other components within normal limits  I-STAT ARTERIAL BLOOD GAS, ED - Abnormal; Notable for the following:    pH, Arterial 7.244 (*)    pO2, Arterial 67.0 (*)    Bicarbonate 19.8 (*)    Acid-base deficit 7.0 (*)    All other components within normal limits  URINE CULTURE  CULTURE, BLOOD (ROUTINE X 2)  CULTURE, BLOOD (ROUTINE X 2)  CULTURE, EXPECTORATED SPUTUM-ASSESSMENT  C DIFFICILE QUICK SCREEN W PCR REFLEX    MRSA PCR SCREENING  CK  PROCALCITONIN  APTT  CBC WITH DIFFERENTIAL/PLATELET  LACTIC ACID, PLASMA  TROPONIN I  TROPONIN I  COMPREHENSIVE METABOLIC PANEL  HEMOGLOBIN A1C  AMMONIA  STREP PNEUMONIAE URINARY ANTIGEN  LEGIONELLA PNEUMOPHILA SEROGP 1 UR AG  I-STAT TROPOININ, ED    EKG  EKG Interpretation None       Radiology No results found.  Procedures Procedures (including critical care time) CRITICAL CARE Performed by: Lyndal PulleyKnott, Afra Tricarico Total critical care time: 30 minutes Critical care time was exclusive of separately billable procedures and treating other patients. Critical care was necessary to treat or prevent imminent or life-threatening deterioration. Critical care was time spent personally by me on the following activities: development of treatment plan with patient and/or surrogate as well as nursing, discussions with consultants, evaluation of patient's response to treatment, examination of patient, obtaining history from patient or surrogate, ordering and performing treatments and interventions, ordering and review of laboratory studies, ordering and review of radiographic studies, pulse oximetry and re-evaluation of patient's condition.  Medications Ordered in ED Medications  vancomycin (VANCOCIN) IVPB 1000 mg/200 mL premix (not administered)  piperacillin-tazobactam (ZOSYN) IVPB 3.375 g (not administered)  atorvastatin (LIPITOR) tablet 40 mg (not administered)  donepezil (ARICEPT) tablet 5 mg (5 mg Oral Given 04/06/16 0049)  pantoprazole (PROTONIX) EC tablet 40 mg (not administered)  memantine (NAMENDA XR) 24 hr capsule 28 mg (not administered)  sertraline (ZOLOFT) tablet 50 mg (not administered)  latanoprost (XALATAN) 0.005 % ophthalmic solution 1 drop (1 drop Both Eyes Given 04/06/16 0050)  feeding supplement (BOOST HIGH PROTEIN) liquid 237 mL (not  administered)  aspirin EC tablet 81 mg (not administered)  multivitamin with minerals tablet 1 tablet (not  administered)  heparin injection 5,000 Units (not administered)  sodium chloride flush (NS) 0.9 % injection 3 mL (3 mLs Intravenous Given 04/06/16 0050)  0.9 %  sodium chloride infusion ( Intravenous New Bag/Given 04/06/16 0049)  acetaminophen (TYLENOL) tablet 650 mg (not administered)    Or  acetaminophen (TYLENOL) suppository 650 mg (not administered)  HYDROcodone-acetaminophen (NORCO/VICODIN) 5-325 MG per tablet 1-2 tablet (not administered)  polyethylene glycol (MIRALAX / GLYCOLAX) packet 17 g (not administered)  bisacodyl (DULCOLAX) EC tablet 5 mg (not administered)  ondansetron (ZOFRAN) tablet 4 mg (not administered)    Or  ondansetron (ZOFRAN) injection 4 mg (not administered)  insulin aspart (novoLOG) injection 0-9 Units (1 Units Subcutaneous Given 04/06/16 0049)  labetalol (NORMODYNE,TRANDATE) injection 5 mg (not administered)  lactulose (CHRONULAC) 10 GM/15ML solution 20 g (20 g Oral Given 04/06/16 0050)  ondansetron (ZOFRAN) 4 MG/2ML injection (4 mg  Given 04/05/16 2010)  albuterol (PROVENTIL) (2.5 MG/3ML) 0.083% nebulizer solution 5 mg ( Nebulization Canceled Entry 04/05/16 2110)  sodium chloride 0.9 % bolus 1,000 mL (0 mLs Intravenous Stopped 04/05/16 2325)    And  sodium chloride 0.9 % bolus 1,000 mL (0 mLs Intravenous Stopped 04/05/16 2325)  piperacillin-tazobactam (ZOSYN) IVPB 3.375 g (0 g Intravenous Stopped 04/05/16 2325)  vancomycin (VANCOCIN) IVPB 1000 mg/200 mL premix (0 mg Intravenous Stopped 04/05/16 2325)  sodium chloride 0.9 % bolus 1,000 mL (0 mLs Intravenous Stopped 04/05/16 2325)  aspirin suppository 300 mg (300 mg Rectal Given 04/06/16 0049)     Initial Impression / Assessment and Plan / ED Course  I have reviewed the triage vital signs and the nursing notes.  Pertinent labs & imaging results that were available during my care of the patient were reviewed by me and considered in my medical decision making (see chart for details).  Clinical Course    82 y.o. female presents  with unresponsive episode at nursing facility. Lethargic on arrival and covered in vomit. GCS improved after administration of fluids and other supportive care measures. Patient meets clinical criteria for severe sepsis with at least 2 SIRS, evidence of end organ damage and/or lactic acid elevation and suspected source of HCAP. Responsive to fluid resuscitation. Covered broadly with vanc/zosyn early in course d/t severe lactic acidosis and unclear source with likely aspiration.   Hospitalist was consulted for admission and will see the patient in the emergency department.   Final Clinical Impressions(s) / ED Diagnoses   Final diagnoses:  Severe sepsis (HCC)  Lactic acidosis  Acute respiratory failure with hypoxia (HCC)  Elevated transaminase level  Abdominal tenderness  Sepsis [facility])    New Prescriptions Current Discharge Medication List         Lyndal Pulley, MD 04/06/16 0145

## 2016-04-05 NOTE — ED Notes (Signed)
Attempted report 

## 2016-04-05 NOTE — Telephone Encounter (Signed)
Fax not transmitted. Refaxed this a.m.

## 2016-04-05 NOTE — ED Notes (Signed)
Legal guardian/cousin called from [place].  Would love any information:  Barbette Merinolzora Holland @ [phone]

## 2016-04-06 ENCOUNTER — Encounter (HOSPITAL_COMMUNITY): Payer: Self-pay | Admitting: Family Medicine

## 2016-04-06 DIAGNOSIS — G4733 Obstructive sleep apnea (adult) (pediatric): Secondary | ICD-10-CM

## 2016-04-06 DIAGNOSIS — I10 Essential (primary) hypertension: Secondary | ICD-10-CM

## 2016-04-06 DIAGNOSIS — Z8719 Personal history of other diseases of the digestive system: Secondary | ICD-10-CM

## 2016-04-06 DIAGNOSIS — R509 Fever, unspecified: Secondary | ICD-10-CM | POA: Diagnosis present

## 2016-04-06 DIAGNOSIS — K219 Gastro-esophageal reflux disease without esophagitis: Secondary | ICD-10-CM

## 2016-04-06 DIAGNOSIS — I4891 Unspecified atrial fibrillation: Secondary | ICD-10-CM | POA: Diagnosis not present

## 2016-04-06 DIAGNOSIS — N179 Acute kidney failure, unspecified: Secondary | ICD-10-CM

## 2016-04-06 DIAGNOSIS — R739 Hyperglycemia, unspecified: Secondary | ICD-10-CM

## 2016-04-06 DIAGNOSIS — E872 Acidosis: Secondary | ICD-10-CM

## 2016-04-06 LAB — TROPONIN I
TROPONIN I: 0.18 ng/mL — AB (ref <0.03)
TROPONIN I: 0.2 ng/mL — AB (ref <0.03)
TROPONIN I: 0.19 ng/mL — AB (ref <0.03)

## 2016-04-06 LAB — TSH: TSH: 0.528 u[IU]/mL (ref 0.350–4.500)

## 2016-04-06 LAB — COMPREHENSIVE METABOLIC PANEL
ALBUMIN: 3.3 g/dL — AB (ref 3.5–5.0)
ALT: 37 U/L (ref 14–54)
AST: 77 U/L — AB (ref 15–41)
Alkaline Phosphatase: 84 U/L (ref 38–126)
Anion gap: 11 (ref 5–15)
BUN: 15 mg/dL (ref 6–20)
CHLORIDE: 110 mmol/L (ref 101–111)
CO2: 20 mmol/L — AB (ref 22–32)
Calcium: 8.3 mg/dL — ABNORMAL LOW (ref 8.9–10.3)
Creatinine, Ser: 0.99 mg/dL (ref 0.44–1.00)
GFR calc Af Amer: 58 mL/min — ABNORMAL LOW (ref >60)
GFR calc non Af Amer: 50 mL/min — ABNORMAL LOW (ref >60)
GLUCOSE: 112 mg/dL — AB (ref 65–99)
POTASSIUM: 3.6 mmol/L (ref 3.5–5.1)
SODIUM: 141 mmol/L (ref 135–145)
Total Bilirubin: 0.8 mg/dL (ref 0.3–1.2)
Total Protein: 6.8 g/dL (ref 6.5–8.1)

## 2016-04-06 LAB — CBC WITH DIFFERENTIAL/PLATELET
BASOS ABS: 0 10*3/uL (ref 0.0–0.1)
BASOS PCT: 0 %
Eosinophils Absolute: 0 10*3/uL (ref 0.0–0.7)
Eosinophils Relative: 0 %
HEMATOCRIT: 41.9 % (ref 36.0–46.0)
HEMOGLOBIN: 13.3 g/dL (ref 12.0–15.0)
Lymphocytes Relative: 6 %
Lymphs Abs: 1.3 10*3/uL (ref 0.7–4.0)
MCH: 28.2 pg (ref 26.0–34.0)
MCHC: 31.7 g/dL (ref 30.0–36.0)
MCV: 89 fL (ref 78.0–100.0)
MONOS PCT: 6 %
Monocytes Absolute: 1.2 10*3/uL — ABNORMAL HIGH (ref 0.1–1.0)
NEUTROS ABS: 18.3 10*3/uL — AB (ref 1.7–7.7)
NEUTROS PCT: 88 %
Platelets: 300 10*3/uL (ref 150–400)
RBC: 4.71 MIL/uL (ref 3.87–5.11)
RDW: 16 % — ABNORMAL HIGH (ref 11.5–15.5)
WBC: 20.9 10*3/uL — ABNORMAL HIGH (ref 4.0–10.5)

## 2016-04-06 LAB — GLUCOSE, CAPILLARY
GLUCOSE-CAPILLARY: 104 mg/dL — AB (ref 65–99)
GLUCOSE-CAPILLARY: 121 mg/dL — AB (ref 65–99)
GLUCOSE-CAPILLARY: 122 mg/dL — AB (ref 65–99)
GLUCOSE-CAPILLARY: 93 mg/dL (ref 65–99)
Glucose-Capillary: 98 mg/dL (ref 65–99)
Glucose-Capillary: 118 mg/dL — ABNORMAL HIGH (ref 65–99)

## 2016-04-06 LAB — AMMONIA: Ammonia: 67 umol/L — ABNORMAL HIGH (ref 9–35)

## 2016-04-06 LAB — C DIFFICILE QUICK SCREEN W PCR REFLEX
C DIFFICILE (CDIFF) INTERP: NOT DETECTED
C Diff antigen: NEGATIVE
C Diff toxin: NEGATIVE

## 2016-04-06 LAB — MRSA PCR SCREENING: MRSA by PCR: NEGATIVE

## 2016-04-06 LAB — LACTIC ACID, PLASMA: Lactic Acid, Venous: 2.7 mmol/L (ref 0.5–1.9)

## 2016-04-06 MED ORDER — HALOPERIDOL LACTATE 5 MG/ML IJ SOLN
2.0000 mg | Freq: Once | INTRAMUSCULAR | Status: AC
  Administered 2016-04-06: 2 mg via INTRAVENOUS
  Filled 2016-04-06: qty 1

## 2016-04-06 MED ORDER — INSULIN ASPART 100 UNIT/ML ~~LOC~~ SOLN: 0.0000 [IU] | Freq: Three times a day (TID) | SUBCUTANEOUS | Status: DC

## 2016-04-06 MED ORDER — ORAL CARE MOUTH RINSE
15.0000 mL | Freq: Two times a day (BID) | OROMUCOSAL | Status: DC
  Administered 2016-04-06 – 2016-04-09 (×6): 15 mL via OROMUCOSAL

## 2016-04-06 MED ORDER — METOPROLOL SUCCINATE ER 50 MG PO TB24
50.0000 mg | ORAL_TABLET | Freq: Every day | ORAL | Status: DC
  Administered 2016-04-06 – 2016-04-08 (×3): 50 mg via ORAL
  Filled 2016-04-06 (×3): qty 1

## 2016-04-06 MED ORDER — DILTIAZEM HCL 25 MG/5ML IV SOLN
10.0000 mg | INTRAVENOUS | Status: AC
  Administered 2016-04-06: 10 mg via INTRAVENOUS
  Filled 2016-04-06: qty 5

## 2016-04-06 NOTE — Progress Notes (Signed)
PT Cancellation Note  Patient Details Name: [name] MRN: [id number] DOB: 1927-12-24   Cancelled Treatment:    Reason Eval/Treat Not Completed: Patient not medically ready. HR currently 127-138 by telemetry. RN aware and awaiting return call from MD. (noted incr troponin I felt to be demand ischemia). Will attempt eval 9/3 as appropriate.   Shivansh Hardaway 04/06/2016, 4:37 PM Pager [phone]

## 2016-04-06 NOTE — Progress Notes (Signed)
PROGRESS NOTE    [name]  LGX:[id number]  DOB: 21-Dec-1927  DOA: 04/05/2016 PCP: Scarlette Calico, MD Outpatient Specialists:  Hospital course: [name] is a 82 y.o. female with medical history significant for coronary artery disease, hypertension, GERD, depression, and dementia who presents from her nursing home after being found unresponsive and with agonal respirations and severe sepsis.  Assessment & Plan:   1. Severe sepsis, suspected secondary to Aspiration pneumonia versus HCAP (from SNF) - Met criteria for severe sepsis on presentation and suspected source is respiratory given the acute hypoxic respiratory failure and RLL infiltrate; aspiration event likely given report from EMS.   - Blood and urine cultures are incubating - NGTD - 30 cc/kg NS bolus given  - Lactate markedly elevated to 9.75 initially, trending down now at 2  - Empiric abx with vancomycin and Zosyn initiated in ED, will continue given suspected HCAP with aspiration concern; vanc can likely be discontinued in 24-48 hr if MRSA screen negative.  Anticipating de-escalating antibiotics 9/3. - Checking sputum GS and culture; check urine for strep pneumo and legionella antigens; trend PCT  - clinically improving and lactate trending down, will transfer to telemetry 9/2.   2. Prerenal AKI - resolved with hydration - SCr 1.27 on admission, up from apparent baseline of 0.9  - Likely a prerenal azotemia in setting of sepsis  - She has received sepsis fluid boluses - Avoid nephrotoxins, hold her Mobic, repeat chem panel shows resolution of AKI.   3. Metabolic acidosis, elevated AG   - pH 7.24, pCO2 47, pO2 67, serum bicarb 14, AG 19  - Likely secondary to sepsis  - Anticipate resolution with aggressive IVF and abx  - Repeat chem panel in am; repeat ABG prn respiratory decline    4. Hyperglycemia  - Serum glucose 254 on admission with no hx of DM  - Likely secondary to sepsis  - Will check CBG q6h for  now and institute and low-intensity sliding-scale correctional  - A1c pending, blood sugars much better controlled now.    5. Hypertension  - BP has been at goal since admission - Managed with felodipine and metoprolol at the nursing home  - Hold the scheduled antihypertensives for now in setting of severe sepsis and concern for possible progression to shock  - Labetalol IVP's will be available prn    6. GERD  - Managed with Nexium at the nursing home  - EGD from 2013 with small hiatal hernia, no esophagitis  - Continue PPI with Protonix while in hospital   7. OSA - Continue CPAP qHS    8. Dementia - Followed by neurology in outpatient setting, suspected to have Alzheimer's, and has been transitioning to a memory unit at the NH  - Continue Aricept, Namenda    9. CAD  - No anginal complaints - Admission EKG with some non-specific T-wave changes in the lateral leads  - Mild bump in troponin likely demand ischemia from respiratory distress  - Monitoring on telemetry, repeat EKG - Continue Lipitor and ASA 81    10. Nonspecific RUQ Abdominal Pain - US abdomen negative for serious acute findings. Monitor clinically. Monitor Bowel movements.  11. Hyperammonemia - ammonia level slowly trending down, recheck in AM   DVT prophylaxis: sq heparin Code Status: DNR, confirmed with legal guardian at time of admission Family Communication: Updated legal guardian, [name] by phone at [phone] Disposition Plan: transfer to floor 9/2   Consults called: None Admission status:  Inpatient    Antimicrobials: Anti-infectives    Start     Dose/Rate Route Frequency Ordered Stop   04/06/16 2100  vancomycin (VANCOCIN) IVPB 1000 mg/200 mL premix     1,000 mg 200 mL/hr over 60 Minutes Intravenous Every 24 hours 04/05/16 2100     04/06/16 0500  piperacillin-tazobactam (ZOSYN) IVPB 3.375 g     3.375 g 12.5 mL/hr over 240 Minutes Intravenous Every 8 hours 04/05/16 2100      04/05/16 2115  vancomycin (VANCOCIN) IVPB 1000 mg/200 mL premix  Status:  Discontinued     1,000 mg 200 mL/hr over 60 Minutes Intravenous  Once 04/05/16 2100 04/05/16 2101   04/05/16 2115  piperacillin-tazobactam (ZOSYN) IVPB 3.375 g  Status:  Discontinued     3.375 g 100 mL/hr over 30 Minutes Intravenous  Once 04/05/16 2100 04/05/16 2101   04/05/16 2045  piperacillin-tazobactam (ZOSYN) IVPB 3.375 g     3.375 g 100 mL/hr over 30 Minutes Intravenous  Once 04/05/16 2040 04/05/16 2325   04/05/16 2045  vancomycin (VANCOCIN) IVPB 1000 mg/200 mL premix     1,000 mg 200 mL/hr over 60 Minutes Intravenous  Once 04/05/16 2040 04/05/16 2325     Subjective: Pt awake, alert, no complaints, no distress  Objective: Vitals:   04/05/16 2115 04/05/16 2130 04/06/16 0000 04/06/16 0400  BP: 135/70 138/76 (!) 159/74 (!) 163/64  Pulse: 76 77  78  Resp: _0 Temp:   98 F (36.7 C) 98.1 F (36.7 C)  TempSrc:   Oral Oral  SpO2: 100% 100% 99% 100%  Weight:    64.6 kg (142 lb 6.7 oz)  Height:        Intake/Output Summary (Last 24 hours) at 04/06/16 0754 Last data filed at 04/06/16 0445  Gross per 24 hour  Intake           541.67 ml  Output                0 ml  Net           541.67 ml   Filed Weights   04/05/16 2017 04/06/16 0400  Weight: 63.5 kg (140 lb) 64.6 kg (142 lb 6.7 oz)    Exam:  Constitutional: Calm, chronically-ill appearing, awake, alert, no distress Eyes: PERTLA, lids and conjunctivae normal ENMT: Mucous membranes are dry. Posterior pharynx clear of any exudate or lesions.   Neck: normal, supple, no masses, no thyromegaly Respiratory: Coarse rhonchi at both bases, accessory muscle recruitment. No pallor or cyanosis.  Cardiovascular: S1 & S2 heard, regular rate and rhythm, no significant murmur. Trace b/l pretibial edema. No carotid bruits. No significant JVD. Abdomen: No distension, mild tenderness in right quadrants, no masses palpated. Bowel sounds normal.    Musculoskeletal: no clubbing / cyanosis. No joint deformity upper and lower extremities. Normal muscle tone.  Skin: no significant rashes, lesions, ulcers. Warm, dry, well-perfused. Neurologic: CN 2-12 grossly intact. Sensation intact, DTR normal. Strength 5/5 in all 4 limbs.  Psychiatric: Alert, oriented to person only.   Data Reviewed: Basic Metabolic Panel:  Recent Labs Lab 04/05/16 2014 04/05/16 2024 04/06/16 0123  NA 135 136 141  K 4.0 4.1 3.6  CL 102 108 110  CO2 14*  --  20*  GLUCOSE 254* 249* 112*  BUN 17 21* 15  CREATININE 1.27* 1.00 0.99  CALCIUM 8.9  --  8.3*   Liver Function Tests:  Recent Labs Lab 04/05/16 2014 04/06/16 0123  AST 82* 77*  ALT 36 37  ALKPHOS 111 84  BILITOT 0.9 0.8  PROT 7.8 6.8  ALBUMIN 3.7 3.3*   No results for input(s): LIPASE, AMYLASE in the last 168 hours.  Recent Labs Lab 04/05/16 2015 04/06/16 0244  AMMONIA 72* 67*   CBC:  Recent Labs Lab 04/05/16 2014 04/05/16 2024 04/06/16 0123  WBC 17.5*  --  20.9*  NEUTROABS 11.7*  --  18.3*  HGB 13.8 16.0* 13.3  HCT 44.7 47.0* 41.9  MCV 90.9  --  89.0  PLT 385  --  300   Cardiac Enzymes:  Recent Labs Lab 04/05/16 2014 04/05/16 2235 04/06/16 0123  CKTOTAL 132  --   --   TROPONINI  --  0.16* 0.18*   CBG (last 3)   Recent Labs  04/05/16 2025 04/06/16 0011 04/06/16 0335  GLUCAP 176* 122* 104*   Recent Results (from the past 240 hour(s))  C difficile quick scan w PCR reflex     Status: None   Collection Time: 04/06/16 12:19 AM  Result Value Ref Range Status   C Diff antigen NEGATIVE NEGATIVE Final   C Diff toxin NEGATIVE NEGATIVE Final   C Diff interpretation No C. difficile detected.  Final  MRSA PCR Screening     Status: None   Collection Time: 04/06/16 12:27 AM  Result Value Ref Range Status   MRSA by PCR NEGATIVE NEGATIVE Final    Comment:        The GeneXpert MRSA Assay (FDA approved for NASAL specimens only), is one component of a comprehensive MRSA  colonization surveillance program. It is not intended to diagnose MRSA infection nor to guide or monitor treatment for MRSA infections.      Studies: Ct Head Wo Contrast  Result Date: 04/05/2016 CLINICAL DATA:  Unresponsive with agonal respirations, pinpoint pupils. After Marcaine, patient began vomiting and became responsive to pain. Altered mental status. EXAM: CT HEAD WITHOUT CONTRAST TECHNIQUE: Contiguous axial images were obtained from the base of the skull through the vertex without intravenous contrast. COMPARISON:  09/05/2015 FINDINGS: Brain: Mild cerebral atrophy. Mild ventricular dilatation consistent with central atrophy. Low-attenuation changes throughout the deep white matter consistent small vessel ischemia. No mass effect or midline shift. No abnormal extra-axial fluid collections. Gray-white matter junctions are distinct. Basal cisterns are not effaced. No evidence of acute intracranial hemorrhage. Vascular: Vascular calcifications. Skull: Hyperostosis frontalis.  No depressed skull fractures. Sinuses/Orbits: Increased density throughout the sphenoid and temporal bones, likely chronic. Possibly indicating metabolic disease. No change since prior study. Other: No significant changes since previous study. IMPRESSION: No acute intracranial abnormalities. Mild chronic atrophy and small vessel ischemic changes. Electronically Signed   By: Lucienne Capers M.D.   On: 04/05/2016 22:14   US Abdomen Complete  Result Date: 04/05/2016 CLINICAL DATA:  Abdominal tenderness, elevated transaminase levels, sepsis. Sent for 2 days. EXAM: ABDOMEN ULTRASOUND COMPLETE COMPARISON:  CT abdomen and pelvis 02/20/2012 FINDINGS: Gallbladder: Gallbladder is surgically absent. Common bile duct: Diameter: 4.4 mm, normal Liver: No focal lesion identified. Within normal limits in parenchymal echogenicity. IVC: No abnormality visualized. Pancreas: Limited visualization due to overlying bowel gas. Visualized portions  of the body are unremarkable. Spleen: Size and appearance within normal limits. Right Kidney: Length: 10.1 cm. Mild diffuse parenchymal thinning. No hydronephrosis or mass identified. Left Kidney: Length: 10.7 cm. 8 mm cyst demonstrated in the lower pole. No hydronephrosis. Abdominal aorta: No aneurysm.  Aortic calcification. Other findings: Small right pleural effusion incidentally noted. IMPRESSION: Small right pleural effusion. Mild  renal parenchymal atrophy likely representing chronic disease. Aortic atherosclerosis. Electronically Signed   By: Lucienne Capers M.D.   On: 04/05/2016 23:23   Dg Chest Port 1 View  Result Date: 04/05/2016 CLINICAL DATA:  Found unresponsive,agonal respiration, pinpoint pupils, began vomiting and became responsive to pain after Narcan, history hypertension, coronary artery disease EXAM: PORTABLE CHEST 1 VIEW COMPARISON:  Portable exam 2020 hours compared to 05/13/2012 FINDINGS: Enlargement of cardiac silhouette with slight vascular congestion. Atherosclerotic calcification thoracic aorta. Minimal RIGHT basilar atelectasis versus infiltrate. Remaining lungs clear. No pleural effusion or pneumothorax. BILATERAL glenohumeral degenerative changes. IMPRESSION: Enlargement of cardiac silhouette with slight vascular congestion. Mild atelectasis versus infiltrate RIGHT lower lobe. Aortic atherosclerosis. Electronically Signed   By: Lavonia Dana M.D.   On: 04/05/2016 20:29   Scheduled Meds: . aspirin EC  81 mg Oral Daily  . atorvastatin  40 mg Oral Daily  . donepezil  5 mg Oral QHS  . feeding supplement  1 Container Oral BID BM  . heparin  5,000 Units Subcutaneous Q8H  . insulin aspart  0-9 Units Subcutaneous TID WC  . lactulose  20 g Oral BID  . latanoprost  1 drop Both Eyes QHS  . mouth rinse  15 mL Mouth Rinse BID  . memantine  28 mg Oral Daily  . multivitamin with minerals  1 tablet Oral Daily  . pantoprazole  40 mg Oral Daily  . piperacillin-tazobactam (ZOSYN)  IV  3.375  g Intravenous Q8H  . sertraline  50 mg Oral Daily  . sodium chloride flush  3 mL Intravenous Q12H  . vancomycin  1,000 mg Intravenous Q24H   Continuous Infusions: . sodium chloride 125 mL/hr at 04/06/16 0049   Principal Problem:   Sepsis, unspecified organism [facility]) Active Problems:   Essential hypertension   Coronary atherosclerosis   GERD   OSA on CPAP   Moderate dementia without behavioral disturbance   High anion gap metabolic acidosis   Severe sepsis ([place])   Hyperglycemia   AKI (acute kidney injury) ([place])   Sepsis [facility])  Critical Care Time spent: 39 mins  Irwin Brakeman, MD, FAAFP [facility] Pager [phone]  If 7PM-7AM, please contact night-coverage [email or web address] Password [email or web address] 04/06/2016, 7:54 AM    LOS: 1 day

## 2016-04-06 NOTE — Progress Notes (Signed)
04/06/2016 4:48 PM  Pt reassessed as she had episode of Afib with RVR HR 140s. Pt in no distress. Denies chest pain, SOB, still febrile but trending down after tylenol given, get EKG, cardizem 10 mg IVP, give home dose metroprolol, check TSH, echocardiogram.  Pt has no known history of Afib per my review of records, She has been seeing Dr. Burna FortsP. Nishan.  Will ask cardiology to eval tomorrow.  Will continue to monitor, RN updated with care plan.     Maryln Manuel. Johnson, MD

## 2016-04-06 NOTE — Progress Notes (Signed)
SLP Cancellation Note  Patient Details Name: [name] MRN: [id number] DOB: 1927-11-20   Cancelled treatment:       Reason Eval/Treat Not Completed: SLP screened, no needs identified, will sign off; MD ordered discontinuation for swallow eval, RN denies pt swallow difficulty   Jenny Duoshelsea Sumney MA, CCC-SLP Acute Care Speech Language Pathologist    [name], [name] 04/06/2016, 10:20 AM

## 2016-04-06 NOTE — Progress Notes (Signed)
Called by primary RN to give 10 mg IV cardizem for Afib with RVR.  On my arrival to room, patient lying in bed in no distress, denies SOB or CP or dizziness.  Skin warm and dry.  EKG being done.  VS monitored  And uploaded into chart.  HR remains   120's.

## 2016-04-06 NOTE — Progress Notes (Deleted)
Called by primary RN to give 10 mg IV cardizem for Afib with RVR.  On my arrival to room, patient lying in bed in no distress, denies SOB or CP or dizziness.  Skin warm and dry.  EKG being done.  VS monitored  And uploaded into chart.  HR remains   120's.  

## 2016-04-06 NOTE — Progress Notes (Signed)
Patient refused CPAP for tonight.  Told her to have RT notified should she change her mind.
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
## 2016-04-07 ENCOUNTER — Inpatient Hospital Stay (HOSPITAL_COMMUNITY): Payer: Medicare Other

## 2016-04-07 DIAGNOSIS — A419 Sepsis, unspecified organism: Principal | ICD-10-CM

## 2016-04-07 DIAGNOSIS — J9601 Acute respiratory failure with hypoxia: Secondary | ICD-10-CM

## 2016-04-07 DIAGNOSIS — I2510 Atherosclerotic heart disease of native coronary artery without angina pectoris: Secondary | ICD-10-CM

## 2016-04-07 DIAGNOSIS — F0390 Unspecified dementia without behavioral disturbance: Secondary | ICD-10-CM

## 2016-04-07 DIAGNOSIS — D72829 Elevated white blood cell count, unspecified: Secondary | ICD-10-CM

## 2016-04-07 DIAGNOSIS — I4891 Unspecified atrial fibrillation: Secondary | ICD-10-CM

## 2016-04-07 LAB — COMPREHENSIVE METABOLIC PANEL
ALBUMIN: 2.9 g/dL — AB (ref 3.5–5.0)
ALT: 35 U/L (ref 14–54)
AST: 69 U/L — AB (ref 15–41)
Alkaline Phosphatase: 73 U/L (ref 38–126)
Anion gap: 7 (ref 5–15)
BUN: 11 mg/dL (ref 6–20)
CHLORIDE: 109 mmol/L (ref 101–111)
CO2: 23 mmol/L (ref 22–32)
CREATININE: 1.09 mg/dL — AB (ref 0.44–1.00)
Calcium: 8.4 mg/dL — ABNORMAL LOW (ref 8.9–10.3)
GFR calc non Af Amer: 45 mL/min — ABNORMAL LOW (ref >60)
GFR, EST AFRICAN AMERICAN: 52 mL/min — AB (ref >60)
Glucose, Bld: 98 mg/dL (ref 65–99)
Potassium: 2.9 mmol/L — ABNORMAL LOW (ref 3.5–5.1)
SODIUM: 139 mmol/L (ref 135–145)
Total Bilirubin: 1.2 mg/dL (ref 0.3–1.2)
Total Protein: 5.8 g/dL — ABNORMAL LOW (ref 6.5–8.1)

## 2016-04-07 LAB — CBC
HCT: 38 % (ref 36.0–46.0)
Hemoglobin: 11.7 g/dL — ABNORMAL LOW (ref 12.0–15.0)
MCH: 27.6 pg (ref 26.0–34.0)
MCHC: 30.8 g/dL (ref 30.0–36.0)
MCV: 89.6 fL (ref 78.0–100.0)
PLATELETS: 251 10*3/uL (ref 150–400)
RBC: 4.24 MIL/uL (ref 3.87–5.11)
RDW: 16.5 % — ABNORMAL HIGH (ref 11.5–15.5)
WBC: 17.8 10*3/uL — AB (ref 4.0–10.5)

## 2016-04-07 LAB — GLUCOSE, CAPILLARY
GLUCOSE-CAPILLARY: 99 mg/dL (ref 65–99)
Glucose-Capillary: 104 mg/dL — ABNORMAL HIGH (ref 65–99)
Glucose-Capillary: 117 mg/dL — ABNORMAL HIGH (ref 65–99)

## 2016-04-07 LAB — URINE CULTURE: CULTURE: NO GROWTH

## 2016-04-07 LAB — TROPONIN I
TROPONIN I: 0.7 ng/mL — AB (ref <0.03)
TROPONIN I: 0.91 ng/mL — AB (ref <0.03)

## 2016-04-07 MED ORDER — HALOPERIDOL LACTATE 5 MG/ML IJ SOLN
2.0000 mg | Freq: Four times a day (QID) | INTRAMUSCULAR | Status: AC | PRN
  Administered 2016-04-07 – 2016-04-08 (×2): 2 mg via INTRAVENOUS
  Filled 2016-04-07 (×2): qty 1

## 2016-04-07 MED ORDER — POTASSIUM CHLORIDE CRYS ER 20 MEQ PO TBCR
40.0000 meq | EXTENDED_RELEASE_TABLET | Freq: Once | ORAL | Status: AC
  Administered 2016-04-07: 40 meq via ORAL
  Filled 2016-04-07: qty 2

## 2016-04-07 MED ORDER — ENOXAPARIN SODIUM 60 MG/0.6ML ~~LOC~~ SOLN
60.0000 mg | Freq: Once | SUBCUTANEOUS | Status: AC
  Administered 2016-04-07: 60 mg via SUBCUTANEOUS
  Filled 2016-04-07: qty 0.6

## 2016-04-07 MED ORDER — ENOXAPARIN SODIUM 40 MG/0.4ML ~~LOC~~ SOLN
40.0000 mg | SUBCUTANEOUS | Status: DC
  Administered 2016-04-08 – 2016-04-09 (×2): 40 mg via SUBCUTANEOUS
  Filled 2016-04-07 (×2): qty 0.4
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
## 2016-04-07 NOTE — Consult Note (Signed)
CARDIOLOGY CONSULT NOTE   Patient ID: [name] MRN: [id number] DOB/AGE: 1928/06/04 82 y.o.  Admit date: 04/05/2016  Requesting Physician: Dr. Laural Benes Primary Physician:   Sanda Linger, MD Primary Cardiologist:  Dr. [name]  Reason for Consultation:  Afib with RVR  HPI: Jenny MCELVEEN is a 82 y.o. female SNF resident with a history of non obst CAD, HTN, HLD, GERD, depression, and dementia who presented to [facility] on 04/05/16 from her nursing home after being found unresponsive w/ agonal respirations and found to have severe sepsis. She went into afib with RVR and cardiology consulted.   She had a normal cath in 2003 as well as 2006, which showed mild, non obstructive CAD. She had a non-ischemic Myoview in 2007. She was seen by Dr. [name] in 2014 for follow up of palpitations. An event monitor showed benign PAC's and PVC's with no afib.   Patient was reportedly in her USOH until the morning of 04/05/16 when she was found unresponsive with agonal respirations and pinpoint pupils. 2 mg of Narcan were administered in the field and the patient began vomiting. EMS reported the patient to be vomiting and apparently aspirating on arrival. She was brought into the ED for further evaluation. Patient was unable to contribute much to the history given her dementia.  EKG on arrival demonstrated a sinus rhythm with LVH and a secondary repolarization abnormality, as well as nonspecific T-wave changes in the lateral leads. Chest x-ray was notable for cardiomegaly with slight vascular congestion and a mild opacity in the right lower lobe consistent with atelectasis or pneumonia. She had AKI with a creatinine 1.27, up from an apparent baseline of 0.9. She had a serum bicarbonate of 14 with anion gap elevated to 19, and hyperglycemia to 254. Ammonia level was elevated to 72 and CK is normal. CBC is remarkable for leukocytosis to 17,500 and lactic acid is markedly elevated to 9.75. Troponin on arrival was  normal. ABG revealed a pH of 7.24, pCO2 of 47, and pO2 of 67. Urinalysis was not suggestive of infection. Patient was given a 3 L normal saline bolus in the emergency department and urine and blood cultures were obtained. She was treated with DuoNeb and empiric doses of vancomycin and Zosyn. Patient's work of breathing has improved and she has remained hemodynamically stable.  She is felt to have severe sepsis with suspected aspiration PNA vs HCAP (from SNF). Blood and urine cultures are incubating w NGTD.  Today she is resting in bed comfortably. She said she could feel her heart out of rhythm before but cannot tell me much more than that. No CP or SOB. No LE edema, orthopnea or PND. She does report a cough. Feeling better now. History is limited by dementia. When asked which nursing home she lived in, she told me she lives at home with her mother. She doesn't know her own age and appears distressed when she couldn't remember so I stopped questioning. Otherwise she is starting to feel a little better.   Past Medical History:  Diagnosis Date  . Abnormality of gait   . Anemia   . Anxiety   . Bronchitis, mucopurulent recurrent (HCC)   . CAD (coronary artery disease)   . Chronic osteomyelitis, other specified site   . Depression   . Diverticulosis of colon   . DJD (degenerative joint disease)   . Dysrhythmia    OCC HEART PALPITATION DR [name]  . GERD (gastroesophageal reflux disease)   .  Glaucoma   . Hearing loss   . Hiatal hernia   . History of lower GI bleeding    multiple episodes secondary to diverticulosis  . Hx of adenomatous colonic polyps   . Hypercholesteremia   . Hypertension   . Lumbar back pain   . OSA (obstructive sleep apnea)    CPAP  MANY YRS AGO NOT SURE WHERE DONE  . Palpitations   . Personal history of other diseases of digestive system   . Thyroid nodule   . Vitamin D deficiency      Past Surgical History:  Procedure Laterality Date  . ABDOMINAL HYSTERECTOMY      . APPENDECTOMY    . CATARACT EXTRACTION    . CHOLECYSTECTOMY    . INGUINAL HERNIA REPAIR  05/21/2012   WITH MESH   . TOTAL KNEE ARTHROPLASTY     left  . TOTAL KNEE ARTHROPLASTY     right  . VENTRAL HERNIA REPAIR  05/21/2012   Procedure: LAPAROSCOPIC VENTRAL HERNIA;  Surgeon: Atilano Ina, MD,FACS;  Location: [facility] OR;  Service: General;  Laterality: N/A;    Allergies  Allergen Reactions  . Amoxicillin Itching    Chest and back  . Cephalexin Itching    Chest and back  . Penicillins Itching    Chest and back    I have reviewed the patient's current medications . aspirin EC  81 mg Oral Daily  . atorvastatin  40 mg Oral Daily  . donepezil  5 mg Oral QHS  . feeding supplement  1 Container Oral BID BM  . insulin aspart  0-9 Units Subcutaneous TID WC  . lactulose  20 g Oral BID  . latanoprost  1 drop Both Eyes QHS  . mouth rinse  15 mL Mouth Rinse BID  . memantine  28 mg Oral Daily  . metoprolol succinate  50 mg Oral Daily  . multivitamin with minerals  1 tablet Oral Daily  . pantoprazole  40 mg Oral Daily  . piperacillin-tazobactam (ZOSYN)  IV  3.375 g Intravenous Q8H  . sertraline  50 mg Oral Daily  . sodium chloride flush  3 mL Intravenous Q12H     acetaminophen **OR** acetaminophen, bisacodyl, labetalol, ondansetron **OR** ondansetron (ZOFRAN) IV, polyethylene glycol  Prior to Admission medications   Medication Sig Start Date End Date Taking? Authorizing Provider  aspirin 81 MG tablet Take 81 mg by mouth daily. Reported on 01/17/2016   Yes Historical Provider, MD  atorvastatin (LIPITOR) 40 MG tablet Take 1 tablet (40 mg total) by mouth daily. 01/25/16  Yes Etta Grandchild, MD  donepezil (ARICEPT) 5 MG tablet Take 1 tablet (5 mg total) by mouth at bedtime. Reported on 01/17/2016 01/25/16  Yes Etta Grandchild, MD  esomeprazole (NEXIUM) 40 MG capsule Take 1 capsule (40 mg total) by mouth daily. Patient taking differently: Take 40 mg by mouth daily at 12 noon.  01/25/16  Yes Etta Grandchild, MD  feeding supplement (BOOST HIGH PROTEIN) LIQD Take 237 mLs by mouth 2 (two) times daily between meals. 06/21/15  Yes Etta Grandchild, MD  felodipine (PLENDIL) 5 MG 24 hr tablet Take 1 tablet (5 mg total) by mouth daily. TAKE 1 TABLET (5 MG TOTAL) BY MOUTH DAILY. Patient taking differently: Take 5 mg by mouth daily.  01/25/16  Yes Etta Grandchild, MD  meloxicam (MOBIC) 7.5 MG tablet Take 1 tablet (7.5 mg total) by mouth daily. 01/25/16  Yes Etta Grandchild, MD  memantine [facility]  XR) 28 MG CP24 24 hr capsule After you finish starter pack, start taking 1 tablet daily Patient taking differently: Take 28 mg by mouth daily.  01/25/16  Yes Etta Grandchild, MD  metoprolol succinate (TOPROL-XL) 50 MG 24 hr tablet Take 1 tablet (50 mg total) by mouth daily. Take with or immediately following a meal. 01/25/16  Yes Etta Grandchild, MD  Multiple Vitamins-Minerals (CENTRUM SILVER ULTRA WOMENS PO) Take 1 tablet by mouth daily.   Yes Historical Provider, MD  sertraline (ZOLOFT) 50 MG tablet Take 1 tablet (50 mg total) by mouth daily. Patient taking differently: Take 50 mg by mouth at bedtime.  01/25/16  Yes Etta Grandchild, MD  latanoprost (XALATAN) 0.005 % ophthalmic solution Place 1 drop into both eyes at bedtime.  12/21/15   Historical Provider, MD     Social History   Social History  . Marital status: Widowed    Spouse name: N/A  . Number of children: 2  . Years of education: N/A   Occupational History  .  Retired   Social History Main Topics  . Smoking status: Never Smoker  . Smokeless tobacco: Never Used  . Alcohol use No  . Drug use: No  . Sexual activity: Not on file   Other Topics Concern  . Not on file   Social History Narrative  . No narrative on file    Family Status  Relation Status  . Son Deceased at age 65   IN MILITARY--  DX WITH MS  . Son Deceased at age 55'S   OVERDOSE   . Mother Deceased at age 45   ACCIDENT  . Father Deceased   UNKNOWN   Family History  Problem  Relation Age of Onset  . Family history unknown: Yes   Patient cannot recall family history but has two deceased sons. She cannot tell me how they passed.   ROS:  Full 14 point review of systems complete and found to be negative unless listed above.  Physical Exam: Blood pressure 129/78, pulse 92, temperature 99.2 F (37.3 C), temperature source Oral, resp. rate (!) 22, height 5\' 4"  (1.626 m), weight 152 lb 1.6 oz (69 kg), SpO2 92 %.  General: Well developed, well nourished, female in no acute distress Head: Eyes PERRLA, No xanthomas.   Normocephalic and atraumatic, oropharynx without edema or Lungs: CTAB with expiratory cough Heart: HRRR S1 S2, no rub/gallop, Heart irregular rate and rhythm with S1, S2  murmur. pulses are 2+ extrem.   Neck: No carotid bruits. No lymphadenopathy. No JVD. Abdomen: Bowel sounds present, abdomen soft and non-tender without masses or hernias noted. Msk:  No spine or cva tenderness. No weakness, no joint deformities or effusions. Extremities: No clubbing or cyanosis.  No LE edema.  Neuro: Alert and oriented x1. No focal deficits noted. Psych:  Good affect, responds appropriately, dementia is apparent  Skin: No rashes or lesions noted.  Labs:   Lab Results  Component Value Date   WBC 17.8 (H) 04/07/2016   HGB 11.7 (L) 04/07/2016   HCT 38.0 04/07/2016   MCV 89.6 04/07/2016   PLT 251 04/07/2016    Recent Labs  04/05/16 2235  INR 1.20    Recent Labs Lab 04/07/16 0440  NA 139  K 2.9*  CL 109  CO2 23  BUN 11  CREATININE 1.09*  CALCIUM 8.4*  PROT 5.8*  BILITOT 1.2  ALKPHOS 73  ALT 35  AST 69*  GLUCOSE 98  ALBUMIN 2.9*  Magnesium  Date Value Ref Range Status  05/22/2012 1.8 1.5 - 2.5 mg/dL Final    Recent Labs  16/10/96 2014  04/06/16 0848 04/06/16 1704 04/07/16 0004 04/07/16 0440  CKTOTAL 132  --   --   --   --   --   TROPONINI  --   < > 0.20* 0.19* 0.70* 0.91*  < > = values in this interval not displayed.  Recent Labs   04/05/16 2022  TROPIPOC 0.01   No results found for: PROBNP Lab Results  Component Value Date   CHOL 219 (H) 10/17/2015   HDL 70.00 10/17/2015   LDLCALC 131 (H) 10/17/2015   TRIG 88.0 10/17/2015    Echo: pending  ECG:  HR 133 Atrial fibrillation with rapid ventricular response Nonspecific ST and T wave abnormality  Radiology:  Ct Head Wo Contrast  Result Date: 04/05/2016 CLINICAL DATA:  Unresponsive with agonal respirations, pinpoint pupils. After Marcaine, patient began vomiting and became responsive to pain. Altered mental status. EXAM: CT HEAD WITHOUT CONTRAST TECHNIQUE: Contiguous axial images were obtained from the base of the skull through the vertex without intravenous contrast. COMPARISON:  09/05/2015 FINDINGS: Brain: Mild cerebral atrophy. Mild ventricular dilatation consistent with central atrophy. Low-attenuation changes throughout the deep white matter consistent small vessel ischemia. No mass effect or midline shift. No abnormal extra-axial fluid collections. Gray-white matter junctions are distinct. Basal cisterns are not effaced. No evidence of acute intracranial hemorrhage. Vascular: Vascular calcifications. Skull: Hyperostosis frontalis.  No depressed skull fractures. Sinuses/Orbits: Increased density throughout the sphenoid and temporal bones, likely chronic. Possibly indicating metabolic disease. No change since prior study. Other: No significant changes since previous study. IMPRESSION: No acute intracranial abnormalities. Mild chronic atrophy and small vessel ischemic changes. Electronically Signed   By: Burman Nieves M.D.   On: 04/05/2016 22:14   US Abdomen Complete  Result Date: 04/05/2016 CLINICAL DATA:  Abdominal tenderness, elevated transaminase levels, sepsis. Sent for 2 days. EXAM: ABDOMEN ULTRASOUND COMPLETE COMPARISON:  CT abdomen and pelvis 02/20/2012 FINDINGS: Gallbladder: Gallbladder is surgically absent. Common bile duct: Diameter: 4.4 mm, normal Liver: No  focal lesion identified. Within normal limits in parenchymal echogenicity. IVC: No abnormality visualized. Pancreas: Limited visualization due to overlying bowel gas. Visualized portions of the body are unremarkable. Spleen: Size and appearance within normal limits. Right Kidney: Length: 10.1 cm. Mild diffuse parenchymal thinning. No hydronephrosis or mass identified. Left Kidney: Length: 10.7 cm. 8 mm cyst demonstrated in the lower pole. No hydronephrosis. Abdominal aorta: No aneurysm.  Aortic calcification. Other findings: Small right pleural effusion incidentally noted. IMPRESSION: Small right pleural effusion. Mild renal parenchymal atrophy likely representing chronic disease. Aortic atherosclerosis. Electronically Signed   By: Burman Nieves M.D.   On: 04/05/2016 23:23   Dg Chest Port 1 View  Result Date: 04/05/2016 CLINICAL DATA:  Found unresponsive,agonal respiration, pinpoint pupils, began vomiting and became responsive to pain after Narcan, history hypertension, coronary artery disease EXAM: PORTABLE CHEST 1 VIEW COMPARISON:  Portable exam 2020 hours compared to 05/13/2012 FINDINGS: Enlargement of cardiac silhouette with slight vascular congestion. Atherosclerotic calcification thoracic aorta. Minimal RIGHT basilar atelectasis versus infiltrate. Remaining lungs clear. No pleural effusion or pneumothorax. BILATERAL glenohumeral degenerative changes. IMPRESSION: Enlargement of cardiac silhouette with slight vascular congestion. Mild atelectasis versus infiltrate RIGHT lower lobe. Aortic atherosclerosis. Electronically Signed   By: Ulyses Southward M.D.   On: 04/05/2016 20:29    Cath 2006  FINDINGS:  1.  LV: 140/07/15. EF 60% with focal inferior hypokinesis.  2.  Left main: Angiographically normal.  3.  LAD: Very large vessel which wraps around the apex to supply much of the      inferior wall. It gives rise to a single large diagonal branch. It is      angiographically normal.  4.  Circumflex:  Large, technically dominant vessel. It gives rise to a      single large branching obtuse marginal and the PDA. There is a 30% focal      stenosis of the distal circumflex prior to the takeoff of the PDA.  5.  RCA: Small, nondominant vessel. It is angiographically normal.  IMPRESSION/RECOMMENDATION:  Mild, nonobstructive coronary disease. No change  compared with December 2003.   ASSESSMENT AND PLAN:    Principal Problem:   Severe sepsis (HCC) Active Problems:   Essential hypertension   Coronary atherosclerosis   GERD   Diverticulosis with h/o GI bleed x 2   OSA on CPAP   Moderate dementia without behavioral disturbance   Sepsis, unspecified organism (HCC)   High anion gap metabolic acidosis   Hyperglycemia   AKI (acute kidney injury) (HCC)   History of lower GI bleeding   Atrial fibrillation with rapid ventricular response (HCC)   High fever  Atrial fibrillation with RVR: given one dose of IV cardizem 10mg . She spontaneously converted to NSR with freq PVCs and PACs. Afib likely precipitated by acute illness with severe sepsis assumed from HCAP vs asp PNA -- CHADSVASC score of at least 5 (HTN, age, F sex, vasc dz). Not sure she is a good long term OAC candidate given her history of dementia.  -- TSH normal and 2D ECHO pending. Continue Toprol XL 50mg  daily for now.   Elevated troponin: mild elevation and flat (0.2--> 0.19--> 0.70--> 0.91) c/w demand ischemia in the setting of afib with RVR and severe sepsis. She has known non obstructive CAD by cath in 2003 and 2006. Not likely a candidate for further ischemic eval given her dementia.   Hypokalemia: K 2.9. I will supplement this  AKI: creat improving with IVFs: 1.27--> 1.09  Severe sepsis felt 2/2 to HCAP vs asp PNA: continue Vanc Zosyn. White count improving. Fever improving. Blood cultures with NGTD  Non obst CAD: continue ASA, statin and BB   HTN: BP well controlled currently.   HLD: continue statin   Dementia: on  aricept. Unclear baseline but today she could not tell me how old she was, denied living at a nursing facility and told me she lives at home with her mother.   Signed: Cline CrockKathryn Thompson, PA-C 04/07/2016 7:37 AM   Pager [phone]  Co-Sign MD   Attending note:  Patient seen and examined. Agree with assessment by Ms. Molson Coors Brewinghompson PA-C. Ms. Claudina Lickonkins is a patient of Dr. Eden EmmsNishan with nonobstructive CAD based on previous testing, currently nursing home resident with dementia. She is admitted with sepsis felt secondary to aspiration versus HCAP. During hospital stay she was noted to have an episode of rapid atrial fibrillation that spontaneously converted to sinus rhythm after IV Cardizem. No clear history of prior atrial fibrillation. CHADSVASC score is 5. She is on ASA and Toprol XL 50 mg daily at this time.  On examination she is in no distress. Poor historian due to dementia. SBP 120-130, HR 90s in sinus rhythm by telemetry. Lungs with expiratory cough (mild), cardiac with RRR and ectopy, soft systolic murmur. Troponin I elevated to 0.91 - most likely demand ischemia. ECG in atrial fibrillation showed NSST changes. Hgb 11.7  and creatinine 1.1.  Episode of atrial fibrillation with RVR in the setting of sepsis. No prior history. CHADSVASC score is 5. At this point would continue ASA and Toprol XL. If she has further significant breakthrough events can discuss possibility of using Amiodarone to suppress and also potentially a change from ASA to Eliquis, although not great candidate for longterm anticoagulation. Troponin I elevation is likely demand ischemia - would not pursue aggressively. Echocardiogram pending.  Jonelle Sidle, M.D., F.A.C.C.

## 2016-04-07 NOTE — Progress Notes (Signed)
Pt refused CPAP tonight.

## 2016-04-07 NOTE — Progress Notes (Signed)
PROGRESS NOTE    [name]  ATF:[id number]  DOB: [birth year]  DOA: 04/05/2016 PCP: Scarlette Calico, MD Outpatient Specialists:  Hospital course: [name] is a 82 y.o. female with medical history significant for coronary artery disease, hypertension, GERD, depression, and dementia who presents from her nursing home after being found unresponsive and with agonal respirations and severe sepsis.  Assessment & Plan:    1. Severe sepsis, suspected secondary to Aspiration pneumonia versus HCAP (from SNF) - Met criteria for severe sepsis on presentation and suspected source is respiratory given the acute hypoxic respiratory failure and RLL infiltrate; aspiration event likely given report from EMS.   - Blood and urine cultures are incubating - NGTD - 30 cc/kg NS bolus given  - Lactate markedly elevated to 9.75 initially, trending down now at 2  - Empiric abx with vancomycin and Zosyn initiated in ED, will continue given suspected HCAP with aspiration concern; vanc can likely be discontinued in 24-48 hr if MRSA screen negative.  Anticipating de-escalating antibiotics 9/3. - Checking sputum GS and culture; check urine for strep pneumo and legionella antigens; trend PCT  - clinically improving and lactate trending down, will transfer to telemetry 9/2.   2. Prerenal AKI - resolved with hydration - SCr 1.27 on admission, up from apparent baseline of 0.9  - Likely a prerenal azotemia in setting of sepsis  - She has received sepsis fluid boluses - Avoid nephrotoxins, hold her Mobic, repeat chem panel shows resolution of AKI.   3. Metabolic acidosis, elevated AG   - pH 7.24, pCO2 47, pO2 67, serum bicarb 14, AG 19  - Likely secondary to sepsis  - Anticipate resolution with aggressive IVF and abx    4. Hyperglycemia  - Serum glucose 254 on admission with no hx of DM  - Likely secondary to sepsis  - Will check CBG q6h for now and institute and low-intensity sliding-scale correctional  -  A1c pending, blood sugars much better controlled now.    5. Hypertension  - BP has been at goal since admission - Managed with felodipine and metoprolol at the nursing home  - Hold the scheduled antihypertensives for now in setting of severe sepsis and concern for possible progression to shock  - Labetalol IVP's will be available prn    6. GERD  - Managed with Nexium at the nursing home  - EGD from 2013 with small hiatal hernia, no esophagitis  - Continue PPI with Protonix while in hospital   7. OSA - Continue CPAP qHS    8. Dementia - Followed by neurology in outpatient setting, suspected to have Alzheimer's, and has been transitioning to a memory unit at the NH  - Continue Aricept, Namenda    9. CAD  - No anginal complaints - Admission EKG with some non-specific T-wave changes in the lateral leads  - Mild bump in troponin likely demand ischemia from respiratory distress  - Monitoring on telemetry, repeat EKG - Continue Lipitor and ASA 81    10. Nonspecific RUQ Abdominal Pain - US abdomen negative for serious acute findings. Monitor clinically. Monitor Bowel movements.  11. Hyperammonemia - ammonia level slowly trending down, recheck in AM   12. Afib with RVR - acute onset, likely secondary to sepsis, acute illness, appreciate cardiology consultation, echo pending, continue metoprolol daily for now.  TSH - WNL Echo - pending  DVT prophylaxis: sq heparin Code Status: DNR, confirmed with legal guardian at time of admission Family Communication: Updated legal guardian,  [name] by phone at [phone] - 9/3 Disposition Plan: transfer to floor 9/2   Consults called: cardiology  Admission status: Inpatient   Antimicrobials: Anti-infectives    Start     Dose/Rate Route Frequency Ordered Stop   04/06/16 2100  vancomycin (VANCOCIN) IVPB 1000 mg/200 mL premix     1,000 mg 200 mL/hr over 60 Minutes Intravenous Every 24 hours 04/05/16 2100 04/06/16 2228   04/06/16  0500  piperacillin-tazobactam (ZOSYN) IVPB 3.375 g     3.375 g 12.5 mL/hr over 240 Minutes Intravenous Every 8 hours 04/05/16 2100     04/05/16 2115  vancomycin (VANCOCIN) IVPB 1000 mg/200 mL premix  Status:  Discontinued     1,000 mg 200 mL/hr over 60 Minutes Intravenous  Once 04/05/16 2100 04/05/16 2101   04/05/16 2115  piperacillin-tazobactam (ZOSYN) IVPB 3.375 g  Status:  Discontinued     3.375 g 100 mL/hr over 30 Minutes Intravenous  Once 04/05/16 2100 04/05/16 2101   04/05/16 2045  piperacillin-tazobactam (ZOSYN) IVPB 3.375 g     3.375 g 100 mL/hr over 30 Minutes Intravenous  Once 04/05/16 2040 04/05/16 2325   04/05/16 2045  vancomycin (VANCOCIN) IVPB 1000 mg/200 mL premix     1,000 mg 200 mL/hr over 60 Minutes Intravenous  Once 04/05/16 2040 04/05/16 2325     Subjective: Pt awake, alert, no complaints, no distress, not eating very well.   Objective: Vitals:   04/06/16 2117 04/06/16 2118 04/07/16 0620 04/07/16 0622  BP: 130/83  (!) 159/75 129/78  Pulse: 91  67 92  Resp: (!) 22 19 (!) 22 (!) 22  Temp: (!) 100.4 F (38 C)   99.2 F (37.3 C)  TempSrc: Oral   Oral  SpO2: 97%  (!) 89% 92%  Weight:    69 kg (152 lb 1.6 oz)  Height:        Intake/Output Summary (Last 24 hours) at 04/07/16 1119 Last data filed at 04/07/16 0630  Gross per 24 hour  Intake          1088.75 ml  Output                0 ml  Net          1088.75 ml   Filed Weights   04/05/16 2017 04/06/16 0400 04/07/16 0622  Weight: 63.5 kg (140 lb) 64.6 kg (142 lb 6.7 oz) 69 kg (152 lb 1.6 oz)    Exam:  Constitutional: Calm, chronically-ill appearing, awake, alert, no distress Eyes: PERTLA, lids and conjunctivae normal ENMT: Mucous membranes are dry. Posterior pharynx clear of any exudate or lesions.   Neck: normal, supple, no masses, no thyromegaly Respiratory: Coarse rhonchi at both bases, accessory muscle recruitment. No pallor or cyanosis.  Cardiovascular: S1 & S2 heard, regular rate and rhythm, no  significant murmur. Trace b/l pretibial edema. No carotid bruits. No significant JVD. Abdomen: No distension, mild tenderness in right quadrants, no masses palpated. Bowel sounds normal.  Musculoskeletal: no clubbing / cyanosis. No joint deformity upper and lower extremities. Normal muscle tone.  Skin: no significant rashes, lesions, ulcers. Warm, dry, well-perfused. Neurologic: CN 2-12 grossly intact. Sensation intact, DTR normal. Strength 5/5 in all 4 limbs.  Psychiatric: Alert, oriented to person only.   Data Reviewed: Basic Metabolic Panel:  Recent Labs Lab 04/05/16 2014 04/05/16 2024 04/06/16 0123 04/07/16 0440  NA 135 136 141 139  K 4.0 4.1 3.6 2.9*  CL 102 108 110 109  CO2 14*  --  20* 23  GLUCOSE 254* 249* 112* 98  BUN 17 21* 15 11  CREATININE 1.27* 1.00 0.99 1.09*  CALCIUM 8.9  --  8.3* 8.4*   Liver Function Tests:  Recent Labs Lab 04/05/16 2014 04/06/16 0123 04/07/16 0440  AST 82* 77* 69*  ALT 36 37 35  ALKPHOS 111 84 73  BILITOT 0.9 0.8 1.2  PROT 7.8 6.8 5.8*  ALBUMIN 3.7 3.3* 2.9*   No results for input(s): LIPASE, AMYLASE in the last 168 hours.  Recent Labs Lab 04/05/16 2015 04/06/16 0244  AMMONIA 72* 67*   CBC:  Recent Labs Lab 04/05/16 2014 04/05/16 2024 04/06/16 0123 04/07/16 0440  WBC 17.5*  --  20.9* 17.8*  NEUTROABS 11.7*  --  18.3*  --   HGB 13.8 16.0* 13.3 11.7*  HCT 44.7 47.0* 41.9 38.0  MCV 90.9  --  89.0 89.6  PLT 385  --  300 251   Cardiac Enzymes:  Recent Labs Lab 04/05/16 2014  04/06/16 0123 04/06/16 0848 04/06/16 1704 04/07/16 0004 04/07/16 0440  CKTOTAL 132  --   --   --   --   --   --   TROPONINI  --   < > 0.18* 0.20* 0.19* 0.70* 0.91*  < > = values in this interval not displayed. CBG (last 3)   Recent Labs  04/06/16 1702 04/06/16 2111 04/07/16 0751  GLUCAP 118* 121* 104*   Recent Results (from the past 240 hour(s))  Blood Culture (routine x 2)     Status: None (Preliminary result)   Collection Time:  04/05/16  8:14 PM  Result Value Ref Range Status   Specimen Description BLOOD RIGHT FOREARM  Final   Special Requests IN PEDIATRIC BOTTLE 2CC  Final   Culture NO GROWTH < 24 HOURS  Final   Report Status PENDING  Incomplete  Urine culture     Status: None   Collection Time: 04/05/16  8:44 PM  Result Value Ref Range Status   Specimen Description URINE, RANDOM  Final   Special Requests NONE  Final   Culture NO GROWTH  Final   Report Status 04/07/2016 FINAL  Final  Blood Culture (routine x 2)     Status: None (Preliminary result)   Collection Time: 04/05/16  9:16 PM  Result Value Ref Range Status   Specimen Description BLOOD RIGHT HAND  Final   Special Requests IN PEDIATRIC BOTTLE 3CC  Final   Culture NO GROWTH < 24 HOURS  Final   Report Status PENDING  Incomplete  C difficile quick scan w PCR reflex     Status: None   Collection Time: 04/06/16 12:19 AM  Result Value Ref Range Status   C Diff antigen NEGATIVE NEGATIVE Final   C Diff toxin NEGATIVE NEGATIVE Final   C Diff interpretation No C. difficile detected.  Final  MRSA PCR Screening     Status: None   Collection Time: 04/06/16 12:27 AM  Result Value Ref Range Status   MRSA by PCR NEGATIVE NEGATIVE Final    Comment:        The GeneXpert MRSA Assay (FDA approved for NASAL specimens only), is one component of a comprehensive MRSA colonization surveillance program. It is not intended to diagnose MRSA infection nor to guide or monitor treatment for MRSA infections.     Studies: Ct Head Wo Contrast  Result Date: 04/05/2016 CLINICAL DATA:  Unresponsive with agonal respirations, pinpoint pupils. After Marcaine, patient began vomiting and became responsive to pain. Altered  mental status. EXAM: CT HEAD WITHOUT CONTRAST TECHNIQUE: Contiguous axial images were obtained from the base of the skull through the vertex without intravenous contrast. COMPARISON:  09/05/2015 FINDINGS: Brain: Mild cerebral atrophy. Mild ventricular dilatation  consistent with central atrophy. Low-attenuation changes throughout the deep white matter consistent small vessel ischemia. No mass effect or midline shift. No abnormal extra-axial fluid collections. Gray-white matter junctions are distinct. Basal cisterns are not effaced. No evidence of acute intracranial hemorrhage. Vascular: Vascular calcifications. Skull: Hyperostosis frontalis.  No depressed skull fractures. Sinuses/Orbits: Increased density throughout the sphenoid and temporal bones, likely chronic. Possibly indicating metabolic disease. No change since prior study. Other: No significant changes since previous study. IMPRESSION: No acute intracranial abnormalities. Mild chronic atrophy and small vessel ischemic changes. Electronically Signed   By: Lucienne Capers M.D.   On: 04/05/2016 22:14   US Abdomen Complete  Result Date: 04/05/2016 CLINICAL DATA:  Abdominal tenderness, elevated transaminase levels, sepsis. Sent for 2 days. EXAM: ABDOMEN ULTRASOUND COMPLETE COMPARISON:  CT abdomen and pelvis 02/20/2012 FINDINGS: Gallbladder: Gallbladder is surgically absent. Common bile duct: Diameter: 4.4 mm, normal Liver: No focal lesion identified. Within normal limits in parenchymal echogenicity. IVC: No abnormality visualized. Pancreas: Limited visualization due to overlying bowel gas. Visualized portions of the body are unremarkable. Spleen: Size and appearance within normal limits. Right Kidney: Length: 10.1 cm. Mild diffuse parenchymal thinning. No hydronephrosis or mass identified. Left Kidney: Length: 10.7 cm. 8 mm cyst demonstrated in the lower pole. No hydronephrosis. Abdominal aorta: No aneurysm.  Aortic calcification. Other findings: Small right pleural effusion incidentally noted. IMPRESSION: Small right pleural effusion. Mild renal parenchymal atrophy likely representing chronic disease. Aortic atherosclerosis. Electronically Signed   By: Lucienne Capers M.D.   On: 04/05/2016 23:23   Dg Chest Port 1  View  Result Date: 04/07/2016 CLINICAL DATA:  Pneumonia, fever, leukocytosis EXAM: PORTABLE CHEST 1 VIEW COMPARISON:  04/05/2016 FINDINGS: Lungs are clear.  No pleural effusion or pneumothorax. Mild cardiomegaly. Degenerative changes of the bilateral shoulders, right greater than left. IMPRESSION: No evidence of acute cardiopulmonary disease. Electronically Signed   By: Julian Hy M.D.   On: 04/07/2016 08:19   Dg Chest Port 1 View  Result Date: 04/05/2016 CLINICAL DATA:  Found unresponsive,agonal respiration, pinpoint pupils, began vomiting and became responsive to pain after Narcan, history hypertension, coronary artery disease EXAM: PORTABLE CHEST 1 VIEW COMPARISON:  Portable exam 2020 hours compared to 05/13/2012 FINDINGS: Enlargement of cardiac silhouette with slight vascular congestion. Atherosclerotic calcification thoracic aorta. Minimal RIGHT basilar atelectasis versus infiltrate. Remaining lungs clear. No pleural effusion or pneumothorax. BILATERAL glenohumeral degenerative changes. IMPRESSION: Enlargement of cardiac silhouette with slight vascular congestion. Mild atelectasis versus infiltrate RIGHT lower lobe. Aortic atherosclerosis. Electronically Signed   By: Lavonia Dana M.D.   On: 04/05/2016 20:29   Scheduled Meds: . aspirin EC  81 mg Oral Daily  . atorvastatin  40 mg Oral Daily  . donepezil  5 mg Oral QHS  . feeding supplement  1 Container Oral BID BM  . insulin aspart  0-9 Units Subcutaneous TID WC  . lactulose  20 g Oral BID  . latanoprost  1 drop Both Eyes QHS  . mouth rinse  15 mL Mouth Rinse BID  . memantine  28 mg Oral Daily  . metoprolol succinate  50 mg Oral Daily  . multivitamin with minerals  1 tablet Oral Daily  . pantoprazole  40 mg Oral Daily  . piperacillin-tazobactam (ZOSYN)  IV  3.375 g Intravenous Q8H  .  potassium chloride  40 mEq Oral Once  . sertraline  50 mg Oral Daily  . sodium chloride flush  3 mL Intravenous Q12H   Continuous Infusions:     Principal Problem:   Severe sepsis (HCC) Active Problems:   Atrial fibrillation with rapid ventricular response (HCC)   Essential hypertension   Coronary atherosclerosis   GERD   Diverticulosis with h/o GI bleed x 2   OSA on CPAP   Moderate dementia without behavioral disturbance   Sepsis, unspecified organism (HCC)   High anion gap metabolic acidosis   Hyperglycemia   AKI (acute kidney injury) ([place])   History of lower GI bleeding   High fever  Critical Care Time spent: 22 mins  Irwin Brakeman, MD, FAAFP [facility] Pager [phone]  If 7PM-7AM, please contact night-coverage [email or web address] Password [email or web address] 04/07/2016, 11:18 AM    LOS: 2 days

## 2016-04-07 NOTE — Progress Notes (Signed)
PT Cancellation Note  Patient Details Name: Fuller CanadaDaisy M Yahr MRN: [id number] DOB: 1927/10/12   Cancelled Treatment:    Reason Eval/Treat Not Completed: Medical issues which prohibited therapy (pt with elevated troponin and await declining values)   Toney Sangabor, Coren Crownover Beth 04/07/2016, 8:14 AM Delaney MeigsMaija Tabor Xitlally Mooneyham, PT [phone]

## 2016-04-08 ENCOUNTER — Inpatient Hospital Stay (HOSPITAL_COMMUNITY): Payer: Medicare Other

## 2016-04-08 DIAGNOSIS — I4891 Unspecified atrial fibrillation: Secondary | ICD-10-CM

## 2016-04-08 DIAGNOSIS — E872 Acidosis, unspecified: Secondary | ICD-10-CM

## 2016-04-08 DIAGNOSIS — R740 Nonspecific elevation of levels of transaminase and lactic acid dehydrogenase [LDH]: Secondary | ICD-10-CM

## 2016-04-08 DIAGNOSIS — R6520 Severe sepsis without septic shock: Secondary | ICD-10-CM

## 2016-04-08 LAB — BASIC METABOLIC PANEL
ANION GAP: 4 — AB (ref 5–15)
BUN: 11 mg/dL (ref 6–20)
CHLORIDE: 111 mmol/L (ref 101–111)
CO2: 24 mmol/L (ref 22–32)
Calcium: 9 mg/dL (ref 8.9–10.3)
Creatinine, Ser: 1.12 mg/dL — ABNORMAL HIGH (ref 0.44–1.00)
GFR calc non Af Amer: 43 mL/min — ABNORMAL LOW (ref >60)
GFR, EST AFRICAN AMERICAN: 50 mL/min — AB (ref >60)
Glucose, Bld: 100 mg/dL — ABNORMAL HIGH (ref 65–99)
POTASSIUM: 3.4 mmol/L — AB (ref 3.5–5.1)
Sodium: 139 mmol/L (ref 135–145)

## 2016-04-08 LAB — CBC
HEMATOCRIT: 38.4 % (ref 36.0–46.0)
HEMOGLOBIN: 11.5 g/dL — AB (ref 12.0–15.0)
MCH: 26.9 pg (ref 26.0–34.0)
MCHC: 29.9 g/dL — AB (ref 30.0–36.0)
MCV: 89.9 fL (ref 78.0–100.0)
Platelets: 273 10*3/uL (ref 150–400)
RBC: 4.27 MIL/uL (ref 3.87–5.11)
RDW: 16.5 % — ABNORMAL HIGH (ref 11.5–15.5)
WBC: 11.1 10*3/uL — AB (ref 4.0–10.5)

## 2016-04-08 LAB — ECHOCARDIOGRAM COMPLETE
Height: 64 in
Weight: 2491.2 oz

## 2016-04-08 LAB — GLUCOSE, CAPILLARY
GLUCOSE-CAPILLARY: 111 mg/dL — AB (ref 65–99)
GLUCOSE-CAPILLARY: 98 mg/dL (ref 65–99)
Glucose-Capillary: 100 mg/dL — ABNORMAL HIGH (ref 65–99)

## 2016-04-08 MED ORDER — DOXYCYCLINE HYCLATE 100 MG PO TABS
100.0000 mg | ORAL_TABLET | Freq: Two times a day (BID) | ORAL | Status: DC
  Administered 2016-04-09: 100 mg via ORAL
  Filled 2016-04-08: qty 1

## 2016-04-08 MED ORDER — FELODIPINE ER 5 MG PO TB24
5.0000 mg | ORAL_TABLET | Freq: Every day | ORAL | Status: DC
  Administered 2016-04-08 – 2016-04-09 (×2): 5 mg via ORAL
  Filled 2016-04-08 (×2): qty 1

## 2016-04-08 MED ORDER — POTASSIUM CHLORIDE CRYS ER 10 MEQ PO TBCR
30.0000 meq | EXTENDED_RELEASE_TABLET | Freq: Two times a day (BID) | ORAL | Status: AC
Start: 2016-04-08 — End: 2016-04-08
  Administered 2016-04-08 (×2): 30 meq via ORAL
  Filled 2016-04-08 (×2): qty 1

## 2016-04-08 NOTE — Progress Notes (Signed)
Pharmacy Antibiotic Note Jenny CanadaDaisy M Guzman is a 82 y.o. female admitted on 04/05/2016 with sepsis 2/2 aspiration pneumonia vs HCAP. Of note, patient has allergy to penicillin. Dr. Clydene PughKnott notified of allergy and wishes to proceed with Zosyn at this time.  Vancomycin was discontinued and Zosyn continues. Patient is currently afebrile with white blood cell counts trending down. Serum creatinine remains stable. Cultures remain negative to date.   Plan: Zosyn 3.375g IV (4 hr infusion) every 8 hours Monitor renal function and clinical picture Follow-up cultures and length of therapy  Monitor for signs and symptoms of allergic response (itching/rash)   Height: 5\' 4"  (162.6 cm) Weight: 155 lb 11.2 oz (70.6 kg) IBW/kg (Calculated) : 54.7  Temp (24hrs), Avg:98.2 F (36.8 C), Min:97.5 F (36.4 C), Max:99.2 F (37.3 C)   Recent Labs Lab 04/05/16 2014 04/05/16 2024 04/05/16 2234 04/06/16 0123 04/07/16 0440 04/08/16 0327  WBC 17.5*  --   --  20.9* 17.8* 11.1*  CREATININE 1.27* 1.00  --  0.99 1.09* 1.12*  LATICACIDVEN  --  9.75* 3.5* 2.7*  --   --     Estimated Creatinine Clearance: 34.8 mL/min (by C-G formula based on SCr of 1.12 mg/dL).    Allergies  Allergen Reactions  . Amoxicillin Itching    Chest and back  . Cephalexin Itching    Chest and back  . Penicillins Itching    Chest and back    Antimicrobials this admission: 9/1 Vanc >> 9/2 9/1 Zosyn >>   Dose adjustments this admission: N/A  Microbiology results: 9/1 BCx x2: NGTD 9/1 UCx: NG final  9/2 C Diff negative  9/2 MRSA PCR negative  Thank you for allowing pharmacy to be a part of this patient's care.  Carylon PerchesMaggie Ladelle Teodoro, PharmD Acute Care Pharmacy Resident  Pager: [phone] 04/08/2016

## 2016-04-08 NOTE — Progress Notes (Signed)
  Echocardiogram 2D Echocardiogram has been performed.  Arvil ChacoFoster, Adler Alton 04/08/2016, 3:05 PM

## 2016-04-08 NOTE — Progress Notes (Signed)
   04/08/16 0712  Vitals  Temp 97.5 F (36.4 C)  Temp Source Oral  BP (!) 171/84  MAP (mmHg) 104  BP Location Left Arm  BP Method Automatic  Patient Position (if appropriate) Lying  Pulse Rate 62  Resp 17    Md made aware of BP

## 2016-04-08 NOTE — Progress Notes (Signed)
Patient ID: [name], female   DOB: Feb 11, 1928, 82 y.o.   MRN: [id number]    Subjective:  Denies SSCP, palpitations or Dyspnea   Objective:  Vitals:   04/07/16 2207 04/08/16 0126 04/08/16 0550 04/08/16 0712  BP: (!) 143/71  (!) 179/84 (!) 171/84  Pulse: 60  61 62  Resp: 18  17 17   Temp: 98.6 F (37 C)  97.6 F (36.4 C) 97.5 F (36.4 C)  TempSrc: Oral  Oral Oral  SpO2: 93%  91%   Weight:  155 lb 11.2 oz (70.6 kg)    Height:        Intake/Output from previous day:  Intake/Output Summary (Last 24 hours) at 04/08/16 0800 Last data filed at 04/08/16 0701  Gross per 24 hour  Intake              340 ml  Output                0 ml  Net              340 ml    Physical Exam: Affect appropriate Healthy:  appears stated age HEENT: normal Neck supple with no adenopathy JVP normal no bruits no thyromegaly Lungs clear with no wheezing and good diaphragmatic motion Heart:  S1/S2 no murmur, no rub, gallop or click PMI normal Abdomen: benighn, BS positve, no tenderness, no AAA no bruit.  No HSM or HJR Distal pulses intact with no bruits No edema Neuro non-focal Skin warm and dry No muscular weakness   Lab Results: Basic Metabolic Panel:  Recent Labs  44/03/47 0440 04/08/16 0327  NA 139 139  K 2.9* 3.4*  CL 109 111  CO2 23 24  GLUCOSE 98 100*  BUN 11 11  CREATININE 1.09* 1.12*  CALCIUM 8.4* 9.0   Liver Function Tests:  Recent Labs  04/06/16 0123 04/07/16 0440  AST 77* 69*  ALT 37 35  ALKPHOS 84 73  BILITOT 0.8 1.2  PROT 6.8 5.8*  ALBUMIN 3.3* 2.9*   No results for input(s): LIPASE, AMYLASE in the last 72 hours. CBC:  Recent Labs  04/05/16 2014  04/06/16 0123 04/07/16 0440 04/08/16 0327  WBC 17.5*  --  20.9* 17.8* 11.1*  NEUTROABS 11.7*  --  18.3*  --   --   HGB 13.8  < > 13.3 11.7* 11.5*  HCT 44.7  < > 41.9 38.0 38.4  MCV 90.9  --  89.0 89.6 89.9  PLT 385  --  300 251 273  < > = values in this interval not displayed. Cardiac  Enzymes:  Recent Labs  04/05/16 2014  04/06/16 1704 04/07/16 0004 04/07/16 0440  CKTOTAL 132  --   --   --   --   TROPONINI  --   < > 0.19* 0.70* 0.91*  < > = values in this interval not displayed. BNP: Invalid input(s): POCBNP D-Dimer: No results for input(s): DDIMER in the last 72 hours. Hemoglobin A1C: No results for input(s): HGBA1C in the last 72 hours. Fasting Lipid Panel: No results for input(s): CHOL, HDL, LDLCALC, TRIG, CHOLHDL, LDLDIRECT in the last 72 hours. Thyroid Function Tests:  Recent Labs  04/06/16 1708  TSH 0.528   Anemia Panel: No results for input(s): VITAMINB12, FOLATE, FERRITIN, TIBC, IRON, RETICCTPCT in the last 72 hours.  Imaging: Dg Chest Port 1 View  Result Date: 04/07/2016 CLINICAL DATA:  Pneumonia, fever, leukocytosis EXAM: PORTABLE CHEST 1 VIEW COMPARISON:  04/05/2016 FINDINGS: Lungs are clear.  No pleural effusion or pneumothorax. Mild cardiomegaly. Degenerative changes of the bilateral shoulders, right greater than left. IMPRESSION: No evidence of acute cardiopulmonary disease. Electronically Signed   By: Charline Bills M.D.   On: 04/07/2016 08:19    Cardiac Studies:  ECG: SR LVH no acute changes    Telemetry:  NSR PAC  04/08/2016   Echo:  Pending   Medications:   . aspirin EC  81 mg Oral Daily  . atorvastatin  40 mg Oral Daily  . donepezil  5 mg Oral QHS  . enoxaparin (LOVENOX) injection  40 mg Subcutaneous Q24H  . feeding supplement  1 Container Oral BID BM  . insulin aspart  0-9 Units Subcutaneous TID WC  . lactulose  20 g Oral BID  . latanoprost  1 drop Both Eyes QHS  . mouth rinse  15 mL Mouth Rinse BID  . memantine  28 mg Oral Daily  . metoprolol succinate  50 mg Oral Daily  . multivitamin with minerals  1 tablet Oral Daily  . pantoprazole  40 mg Oral Daily  . piperacillin-tazobactam (ZOSYN)  IV  3.375 g Intravenous Q8H  . sertraline  50 mg Oral Daily  . sodium chloride flush  3 mL Intravenous Q12H        Assessment/Plan:  PAF:  Short lived related to sepsis and age. Agree with SM given age, dementia and comorbidities would not Anticoagulate at this time continue beta blocker HTN:  Add back calcium blocker she was on plendil at home Sepsis:  On zosyn WBC improved afebrile plan per primary service   Charlton Haws 04/08/2016, 8:00 AM

## 2016-04-08 NOTE — Progress Notes (Signed)
Physical Therapy Evaluation Patient Details Name: [name] MRN: [id number] DOB: 1927-12-24 Today's Date: 04/08/2016   History of Present Illness  pt is an 82 y/o female with history significant for CAD, HTN, and dementia, admitted after being found unresponsive with agonal breathing.  On route to ED, pt vomiting and apparently aspirated. Chem panels suggestive of sepsis suspected secondary to HCAP.  Clinical Impression  Pt admitted with/for sepsis and HCAP.  Pt currently limited functionally due to the problems listed. ( See problems list.)   Pt will benefit from PT to maximize function and safety in order to get ready for next venue listed below.     Follow Up Recommendations SNF    Equipment Recommendations  None recommended by PT    Recommendations for Other Services       Precautions / Restrictions Precautions Precautions: Fall      Mobility  Bed Mobility               General bed mobility comments: pt OOB on BSC  Transfers Overall transfer level: Needs assistance   Transfers: Sit to/from Stand;Stand Pivot Transfers Sit to Stand: Min assist;Min guard Stand pivot transfers: Min assist       General transfer comment: pt does not need physical assist to stand from chair/bsc with arms, but gets tiied up in the lines if not assisted.  Ambulation/Gait Ambulation/Gait assistance: Min assist;Min guard Ambulation Distance (Feet): 150 Feet Assistive device: Rolling walker (2 wheeled) Gait Pattern/deviations: Step-through pattern Gait velocity: slower Gait velocity interpretation: Below normal speed for age/gender General Gait Details: generally steady with a RW, but needs min assist in close, cluttered areas  Stairs            Wheelchair Mobility    Modified Rankin (Stroke Patients Only)       Balance Overall balance assessment: Needs assistance Sitting-balance support: No upper extremity supported Sitting balance-Leahy Scale: Good      Standing balance support: Single extremity supported;No upper extremity supported Standing balance-Leahy Scale: Fair Standing balance comment: based on pt doing her own pericare/hygiene.                             Pertinent Vitals/Pain Pain Assessment: No/denies pain    Home Living Family/patient expects to be discharged to:: Skilled nursing facility                      Prior Function           Comments: unable to determine PLOF     Hand Dominance        Extremity/Trunk Assessment   Upper Extremity Assessment: Defer to OT evaluation           Lower Extremity Assessment: Overall WFL for tasks assessed;Generalized weakness (proximal weakness)         Communication   Communication: No difficulties  Cognition Arousal/Alertness: Awake/alert Behavior During Therapy: WFL for tasks assessed/performed Overall Cognitive Status: History of cognitive impairments - at baseline                      General Comments General comments (skin integrity, edema, etc.): pt repeated facets of her life multiple times before "latching" onto another subject.    Exercises        Assessment/Plan    PT Assessment Patient needs continued PT services  PT Diagnosis Difficulty walking   PT Problem List Decreased strength;Decreased balance;Decreased  mobility;Decreased knowledge of use of DME;Decreased knowledge of precautions;Decreased safety awareness  PT Treatment Interventions Gait training;Functional mobility training;Therapeutic activities;Balance training;Patient/family education;DME instruction   PT Goals (Current goals can be found in the Care Plan section) Acute Rehab PT Goals Patient Stated Goal: go home PT Goal Formulation: With patient Time For Goal Achievement: 04/22/16 Potential to Achieve Goals: Fair    Frequency Min 3X/week   Barriers to discharge        Co-evaluation               End of Session   Activity Tolerance:  Patient tolerated treatment well Patient left: in chair;with call bell/phone within reach;with chair alarm set Nurse Communication: Mobility status         Time: 6301-6010 PT Time Calculation (min) (ACUTE ONLY): 29 min   Charges:   PT Evaluation $PT Eval Moderate Complexity: 1 Procedure PT Treatments $Gait Training: 8-22 mins   PT G Codes:        Callaway Hailes, Eliseo Gum 04/08/2016, 6:17 PM 04/08/2016  Leonidas Bing, PT [phone]  (pager)

## 2016-04-08 NOTE — Progress Notes (Addendum)
PROGRESS NOTE    [name]  UMP:[id number]  DOB: Jun 25, 1928  DOA: 04/05/2016 PCP: Scarlette Calico, MD Outpatient Specialists:  Hospital course: [name] is a 82 y.o. female with medical history significant for coronary artery disease, hypertension, GERD, depression, and dementia who presents from her nursing home after being found unresponsive and with agonal respirations and severe sepsis.  Assessment & Plan:    1. Severe sepsis, suspected secondary to Aspiration pneumonia versus HCAP (from SNF) - Met criteria for severe sepsis on presentation and suspected source is respiratory given the acute hypoxic respiratory failure and RLL infiltrate; aspiration event likely given report from EMS.   - Blood and urine cultures are incubating - NGTD - 30 cc/kg NS bolus given  - Lactate markedly elevated to 9.75 initially, trending down now at 2  - Empiric abx with vancomycin and Zosyn initiated in ED, de-escalate to doxycycline starting 9/5 - clinically improving and lactate trending down, transferred to telemetry 9/2.   2. Prerenal AKI - resolved with hydration - SCr 1.27 on admission, up from apparent baseline of 0.9  - Likely a prerenal azotemia in setting of sepsis  - She has received sepsis fluid boluses - Avoid nephrotoxins, hold her Mobic, repeat chem panel shows resolution of AKI.   3. Metabolic acidosis, elevated AG   - pH 7.24, pCO2 47, pO2 67, serum bicarb 14, AG 19  - Likely secondary to sepsis  - resolved now.     4. Hyperglycemia  - Serum glucose 254 on admission with no hx of DM  - Likely secondary to sepsis  - Will check CBG q6h for now and institute and low-intensity sliding-scale correctional  - A1c pending, blood sugars much better controlled now.    CBG (last 3)   Recent Labs  04/07/16 1154 04/07/16 1626 04/08/16 0823  GLUCAP 117* 99 100*   5. Hypertension  - BP has been at goal since admission - Managed with felodipine and metoprolol at the  nursing home  - Hold the scheduled antihypertensives for now in setting of severe sepsis and concern for possible progression to shock  - Labetalol IV prn    6. GERD  - Managed with Nexium at the nursing home  - EGD from 2013 with small hiatal hernia, no esophagitis  - Continue PPI with Protonix while in hospital   7. OSA - Continue CPAP qHS    8. Dementia - Followed by neurology in outpatient setting, suspected to have Alzheimer's, and has been transitioning to a memory unit at the NH  - Continue Aricept, Namenda    9. CAD  - No anginal complaints - Admission EKG with some non-specific T-wave changes in the lateral leads  - Mild bump in troponin likely demand ischemia from respiratory distress  - Monitoring on telemetry, repeat EKG - Continue Lipitor and ASA 81   -Appreciate cardiology consult. Started back on plendil 9/4.  10. Nonspecific RUQ Abdominal Pain - US abdomen negative for serious acute findings. Monitor clinically. Regular Bowel movements reported.  11. Hyperammonemia - ammonia level trended down. Confusion markedly improved.  12. Afib with RVR - acute onset, likely secondary to sepsis, acute illness, appreciate cardiology consultation, echo pending, continue metoprolol daily for now.  TSH - WNL Echo - pending  DVT prophylaxis: sq heparin Code Status: DNR, confirmed with legal guardian at time of admission Family Communication: Updated legal guardian, [name] by phone at [phone] - 9/3 Disposition Plan: plan to discharge to SNF if  continues to improve 1-2 days   Consults called: cardiology  Admission status: Inpatient   Antimicrobials: Anti-infectives    Start     Dose/Rate Route Frequency Ordered Stop   04/09/16 1000  doxycycline (VIBRA-TABS) tablet 100 mg     100 mg Oral Every 12 hours 04/08/16 1040     04/06/16 2100  vancomycin (VANCOCIN) IVPB 1000 mg/200 mL premix     1,000 mg 200 mL/hr over 60 Minutes Intravenous Every 24 hours  04/05/16 2100 04/06/16 2228   04/06/16 0500  piperacillin-tazobactam (ZOSYN) IVPB 3.375 g     3.375 g 12.5 mL/hr over 240 Minutes Intravenous Every 8 hours 04/05/16 2100 04/09/16 0459   04/05/16 2115  vancomycin (VANCOCIN) IVPB 1000 mg/200 mL premix  Status:  Discontinued     1,000 mg 200 mL/hr over 60 Minutes Intravenous  Once 04/05/16 2100 04/05/16 2101   04/05/16 2115  piperacillin-tazobactam (ZOSYN) IVPB 3.375 g  Status:  Discontinued     3.375 g 100 mL/hr over 30 Minutes Intravenous  Once 04/05/16 2100 04/05/16 2101   04/05/16 2045  piperacillin-tazobactam (ZOSYN) IVPB 3.375 g     3.375 g 100 mL/hr over 30 Minutes Intravenous  Once 04/05/16 2040 04/05/16 2325   04/05/16 2045  vancomycin (VANCOCIN) IVPB 1000 mg/200 mL premix     1,000 mg 200 mL/hr over 60 Minutes Intravenous  Once 04/05/16 2040 04/05/16 2325     Subjective: Pt awake, alert, no complaints, no distress, not eating very well.   Objective: Vitals:   04/07/16 2207 04/08/16 0126 04/08/16 0550 04/08/16 0712  BP: (!) 143/71  (!) 179/84 (!) 171/84  Pulse: 60  61 62  Resp: '18  17 17  '$ Temp: 98.6 F (37 C)  97.6 F (36.4 C) 97.5 F (36.4 C)  TempSrc: Oral  Oral Oral  SpO2: 93%  91%   Weight:  70.6 kg (155 lb 11.2 oz)    Height:        Intake/Output Summary (Last 24 hours) at 04/08/16 1041 Last data filed at 04/08/16 1014  Gross per 24 hour  Intake              340 ml  Output              100 ml  Net              240 ml   Filed Weights   04/06/16 0400 04/07/16 0622 04/08/16 0126  Weight: 64.6 kg (142 lb 6.7 oz) 69 kg (152 lb 1.6 oz) 70.6 kg (155 lb 11.2 oz)    Exam:  Constitutional: Calm, chronically-ill appearing, awake, alert, no distress Eyes: PERTLA, lids and conjunctivae normal ENMT: Mucous membranes are dry. Posterior pharynx clear of any exudate or lesions.   Neck: normal, supple, no masses, no thyromegaly Respiratory: Coarse rhonchi at both bases, accessory muscle recruitment. No pallor or  cyanosis.  Cardiovascular: S1 & S2 heard, regular rate and rhythm, no significant murmur. Trace b/l pretibial edema. No carotid bruits. No significant JVD. Abdomen: No distension, mild tenderness in right quadrants, no masses palpated. Bowel sounds normal.  Musculoskeletal: no clubbing / cyanosis. No joint deformity upper and lower extremities. Normal muscle tone.  Skin: no significant rashes, lesions, ulcers. Warm, dry, well-perfused. Neurologic: CN 2-12 grossly intact. Sensation intact, DTR normal. Strength 5/5 in all 4 limbs.  Psychiatric: Alert, oriented to person only.   Data Reviewed: Basic Metabolic Panel:  Recent Labs Lab 04/05/16 2014 04/05/16 2024 04/06/16 0123 04/07/16 0440 04/08/16  0327  NA 135 136 141 139 139  K 4.0 4.1 3.6 2.9* 3.4*  CL 102 108 110 109 111  CO2 14*  --  20* 23 24  GLUCOSE 254* 249* 112* 98 100*  BUN 17 21* '15 11 11  '$ CREATININE 1.27* 1.00 0.99 1.09* 1.12*  CALCIUM 8.9  --  8.3* 8.4* 9.0   Liver Function Tests:  Recent Labs Lab 04/05/16 2014 04/06/16 0123 04/07/16 0440  AST 82* 77* 69*  ALT 36 37 35  ALKPHOS 111 84 73  BILITOT 0.9 0.8 1.2  PROT 7.8 6.8 5.8*  ALBUMIN 3.7 3.3* 2.9*   No results for input(s): LIPASE, AMYLASE in the last 168 hours.  Recent Labs Lab 04/05/16 2015 04/06/16 0244  AMMONIA 72* 67*   CBC:  Recent Labs Lab 04/05/16 2014 04/05/16 2024 04/06/16 0123 04/07/16 0440 04/08/16 0327  WBC 17.5*  --  20.9* 17.8* 11.1*  NEUTROABS 11.7*  --  18.3*  --   --   HGB 13.8 16.0* 13.3 11.7* 11.5*  HCT 44.7 47.0* 41.9 38.0 38.4  MCV 90.9  --  89.0 89.6 89.9  PLT 385  --  300 251 273   Cardiac Enzymes:  Recent Labs Lab 04/05/16 2014  04/06/16 0123 04/06/16 0848 04/06/16 1704 04/07/16 0004 04/07/16 0440  CKTOTAL 132  --   --   --   --   --   --   TROPONINI  --   < > 0.18* 0.20* 0.19* 0.70* 0.91*  < > = values in this interval not displayed. CBG (last 3)   Recent Labs  04/07/16 1154 04/07/16 1626  04/08/16 0823  GLUCAP 117* 99 100*   Recent Results (from the past 240 hour(s))  Blood Culture (routine x 2)     Status: None (Preliminary result)   Collection Time: 04/05/16  8:14 PM  Result Value Ref Range Status   Specimen Description BLOOD RIGHT FOREARM  Final   Special Requests IN PEDIATRIC BOTTLE 2CC  Final   Culture NO GROWTH 2 DAYS  Final   Report Status PENDING  Incomplete  Urine culture     Status: None   Collection Time: 04/05/16  8:44 PM  Result Value Ref Range Status   Specimen Description URINE, RANDOM  Final   Special Requests NONE  Final   Culture NO GROWTH  Final   Report Status 04/07/2016 FINAL  Final  Blood Culture (routine x 2)     Status: None (Preliminary result)   Collection Time: 04/05/16  9:16 PM  Result Value Ref Range Status   Specimen Description BLOOD RIGHT HAND  Final   Special Requests IN PEDIATRIC BOTTLE 3CC  Final   Culture NO GROWTH 2 DAYS  Final   Report Status PENDING  Incomplete  C difficile quick scan w PCR reflex     Status: None   Collection Time: 04/06/16 12:19 AM  Result Value Ref Range Status   C Diff antigen NEGATIVE NEGATIVE Final   C Diff toxin NEGATIVE NEGATIVE Final   C Diff interpretation No C. difficile detected.  Final  MRSA PCR Screening     Status: None   Collection Time: 04/06/16 12:27 AM  Result Value Ref Range Status   MRSA by PCR NEGATIVE NEGATIVE Final    Comment:        The GeneXpert MRSA Assay (FDA approved for NASAL specimens only), is one component of a comprehensive MRSA colonization surveillance program. It is not intended to diagnose MRSA infection nor  to guide or monitor treatment for MRSA infections.     Studies: Dg Chest Port 1 View  Result Date: 04/07/2016 CLINICAL DATA:  Pneumonia, fever, leukocytosis EXAM: PORTABLE CHEST 1 VIEW COMPARISON:  04/05/2016 FINDINGS: Lungs are clear.  No pleural effusion or pneumothorax. Mild cardiomegaly. Degenerative changes of the bilateral shoulders, right greater  than left. IMPRESSION: No evidence of acute cardiopulmonary disease. Electronically Signed   By: Julian Hy M.D.   On: 04/07/2016 08:19   Scheduled Meds: . aspirin EC  81 mg Oral Daily  . atorvastatin  40 mg Oral Daily  . donepezil  5 mg Oral QHS  . [START ON 04/09/2016] doxycycline  100 mg Oral Q12H  . enoxaparin (LOVENOX) injection  40 mg Subcutaneous Q24H  . feeding supplement  1 Container Oral BID BM  . felodipine  5 mg Oral Daily  . insulin aspart  0-9 Units Subcutaneous TID WC  . lactulose  20 g Oral BID  . latanoprost  1 drop Both Eyes QHS  . mouth rinse  15 mL Mouth Rinse BID  . memantine  28 mg Oral Daily  . metoprolol succinate  50 mg Oral Daily  . multivitamin with minerals  1 tablet Oral Daily  . pantoprazole  40 mg Oral Daily  . piperacillin-tazobactam (ZOSYN)  IV  3.375 g Intravenous Q8H  . sertraline  50 mg Oral Daily  . sodium chloride flush  3 mL Intravenous Q12H   Continuous Infusions:   Principal Problem:   Severe sepsis (HCC) Active Problems:   Atrial fibrillation with rapid ventricular response (HCC)   Essential hypertension   Coronary atherosclerosis   GERD   Diverticulosis with h/o GI bleed x 2   OSA on CPAP   Moderate dementia without behavioral disturbance   Sepsis, unspecified organism (HCC)   High anion gap metabolic acidosis   Hyperglycemia   AKI (acute kidney injury) ([place])   History of lower GI bleeding   High fever   Leukocytosis  Time spent: 21 mins  Irwin Brakeman, MD, FAAFP [facility] Pager [phone]  If 7PM-7AM, please contact night-coverage [email or web address] Password [email or web address] 04/08/2016, 10:41 AM    LOS: 3 days

## 2016-04-08 NOTE — Progress Notes (Signed)
Patient refused use of CPAP for the night. RT will continue to monitor as needed. 

## 2016-04-09 DIAGNOSIS — R509 Fever, unspecified: Secondary | ICD-10-CM

## 2016-04-09 DIAGNOSIS — Z8719 Personal history of other diseases of the digestive system: Secondary | ICD-10-CM

## 2016-04-09 DIAGNOSIS — K5730 Diverticulosis of large intestine without perforation or abscess without bleeding: Secondary | ICD-10-CM

## 2016-04-09 LAB — HEMOGLOBIN A1C
HEMOGLOBIN A1C: 6 % — AB (ref 4.8–5.6)
MEAN PLASMA GLUCOSE: 126 mg/dL

## 2016-04-09 LAB — LEGIONELLA PNEUMOPHILA SEROGP 1 UR AG: L. PNEUMOPHILA SEROGP 1 UR AG: NEGATIVE

## 2016-04-09 LAB — GLUCOSE, CAPILLARY
GLUCOSE-CAPILLARY: 97 mg/dL (ref 65–99)
Glucose-Capillary: 95 mg/dL (ref 65–99)

## 2016-04-09 MED ORDER — METOPROLOL TARTRATE 50 MG PO TABS: 50.0000 mg | ORAL_TABLET | Freq: Two times a day (BID) | ORAL | 0 refills | Status: AC

## 2016-04-09 MED ORDER — DOXYCYCLINE HYCLATE 100 MG PO TABS: 100.0000 mg | ORAL_TABLET | Freq: Two times a day (BID) | ORAL | 0 refills | Status: AC

## 2016-04-09 MED ORDER — METOPROLOL TARTRATE 50 MG PO TABS
50.0000 mg | ORAL_TABLET | Freq: Two times a day (BID) | ORAL | Status: DC
  Administered 2016-04-09: 50 mg via ORAL
  Filled 2016-04-09: qty 1

## 2016-04-09 NOTE — Discharge Instructions (Signed)
Atrial Fibrillation Atrial fibrillation is a type of heartbeat that is irregular or fast (rapid). If you have this condition, your heart keeps quivering in a weird (chaotic) way. This condition can make it so your heart cannot pump blood normally. Having this condition gives a person more risk for stroke, heart failure, and other heart problems. There are different types of atrial fibrillation. Talk with your doctor to learn about the type that you have. HOME CARE  Take over-the-counter and prescription medicines only as told by your doctor.  If your doctor prescribed a blood-thinning medicine, take it exactly as told. Taking too much of it can cause bleeding. If you do not take enough of it, you will not have the protection that you need against stroke and other problems.  Do not use any tobacco products. These include cigarettes, chewing tobacco, and e-cigarettes. If you need help quitting, ask your doctor.  If you have apnea (obstructive sleep apnea), manage it as told by your doctor.  Do not drink alcohol.  Do not drink beverages that have caffeine. These include coffee, soda, and tea.  Maintain a healthy weight. Do not use diet pills unless your doctor says they are safe for you. Diet pills may make heart problems worse.  Follow diet instructions as told by your doctor.  Exercise regularly as told by your doctor.  Keep all follow-up visits as told by your doctor. This is important. GET HELP IF:  You notice a change in the speed, rhythm, or strength of your heartbeat.  You are taking a blood-thinning medicine and you notice more bruising.  You get tired more easily when you move or exercise. GET HELP RIGHT AWAY IF:  You have pain in your chest or your belly (abdomen).  You have sweating or weakness.  You feel sick to your stomach (nauseous).  You notice blood in your throw up (vomit), poop (stool), or pee (urine).  You are short of breath.  You suddenly have swollen feet  and ankles.  You feel dizzy.  Your suddenly get weak or numb in your face, arms, or legs, especially if it happens on one side of your body.  You have trouble talking, trouble understanding, or both.  Your face or your eyelid droops on one side. These symptoms may be an emergency. Do not wait to see if the symptoms will go away. Get medical help right away. Call your local emergency services (911 in the [place] Do not drive yourself to the hospital.   This information is not intended to replace advice given to you by your health care provider. Make sure you discuss any questions you have with your health care provider.   Document Released: 04/30/2008 Document Revised: 04/12/2015 Document Reviewed: 11/16/2014 Elsevier Interactive Patient Education 2016 [employer]. Palpitations A palpitation is the feeling that your heartbeat is irregular. It may feel like your heart is fluttering or skipping a beat. It may also feel like your heart is beating faster than normal. This is usually not a serious problem. In some cases, you may need more medical tests. HOME CARE  Avoid:  Caffeine in coffee, tea, soft drinks, diet pills, and energy drinks.  Chocolate.  Alcohol.  Stop smoking if you smoke.  Reduce your stress and anxiety. Try:  A method that measures bodily functions so you can learn to control them (biofeedback).  Yoga.  Meditation.  Physical activity such as swimming, jogging, or walking.  Get plenty of rest and sleep. GET HELP IF:  Your fast or irregular heartbeat continues after 24 hours.  Your palpitations occur more often. GET HELP RIGHT AWAY IF:   You have chest pain.  You feel short of breath.  You have a very bad headache.  You feel dizzy or pass out (faint). MAKE SURE YOU:   Understand these instructions.  Will watch your condition.  Will get help right away if you are not doing well or get worse.   This information is not intended to replace advice  given to you by your health care provider. Make sure you discuss any questions you have with your health care provider.   Document Released: 04/30/2008 Document Revised: 08/12/2014 Document Reviewed: 09/20/2011 Elsevier Interactive Patient Education 2016 Elsevier Inc.   Antibiotic Medicine Antibiotic medicines are used to treat infections caused by bacteria. They work by injuring or killing the bacteria that is making you sick. HOW IS AN ANTIBIOTIC CHOSEN? An antibiotic is chosen based on many factors. To help your health care provider choose one for you, tell your health care provider if:  You have any allergies.  You are pregnant or plan to get pregnant.  You are breastfeeding.  You are taking any medicines. These include over-the-counter medicines, prescription medicines, and herbal remedies.  You have a medical condition or problem you have not already discussed. Your health care provider will also consider:  How often the medicine has to be taken.  Common side effects of the medicine.  The cost of the medicine.  The taste of the medicine. If you have questions about why an antibiotic was chosen, make sure to ask. FOR HOW LONG SHOULD I TAKE MY ANTIBIOTIC? Continue to take your antibiotic for as long as told by your health care provider. Do not stop taking it when you feel better. If you stop taking it too soon:  You may start to feel sick again.  Your infection may become harder to treat.  Complications may develop. WHAT IF I MISS A DOSE? Try not to miss any doses of medicine. If you miss a dose, take it as soon as possible. However, if it is almost time for the next dose:  If you are taking 2 doses per day, take the missed dose and the next dose 5 to 6 hours apart.  If you are taking 3 or more doses per day, take the missed dose and the next dose 2 to 4 hours apart, then go back to the normal schedule. If you cannot make up a missed dose, take the next scheduled dose  on time. Then take the missed dose after you have taken all the doses as recommended by your health care provider, as if you had one more dose left. DO ANTIBIOTICS AFFECT BIRTH CONTROL? Birth control pills may not work while you are on antibiotics. If you are taking birth control pills, continue taking them as usual and use a second form of birth control, such as a condom, to avoid unwanted pregnancy. Continue using the second form of birth control until you are finished with your current 1 month cycle of birth control pills. OTHER INFORMATION  If there is any medicine left over, throw it away.  Never take someone else's antibiotics.  Never take leftover antibiotics. SEEK MEDICAL CARE IF:  You get worse.  You do not feel better within a few days of starting the antibiotic medicine.  You vomit.  White patches appear in your mouth.  You have new joint pain that begins after starting the  antibiotic.  You have new muscle aches that begin after starting the antibiotic.  You had a fever before starting the antibiotic and it returns.  You have any symptoms of an allergic reaction, such as an itchy rash. If this happens, stop taking the antibiotic. SEEK IMMEDIATE MEDICAL CARE IF:  Your urine turns dark or becomes blood-colored.  Your skin turns yellow.  You bruise or bleed easily.  You have severe diarrhea and abdominal cramps.  You have a severe headache.  You have signs of a severe allergic reaction, such as:  Trouble breathing.  Wheezing.  Swelling of the lips, tongue, or face.  Fainting.  Blisters on the skin or in the mouth. If you have signs of a severe allergic reaction, stop taking the antibiotic right away.   This information is not intended to replace advice given to you by your health care provider. Make sure you discuss any questions you have with your health care provider.   Document Released: 04/03/2004 Document Revised: 04/12/2015 Document Reviewed:  12/07/2014 Elsevier Interactive Patient Education 2016 Elsevier Inc.  Sepsis, Adult Sepsis is a serious infection of your blood or tissues that affects your whole body. The infection that causes sepsis may be bacterial, viral, fungal, or parasitic. Sepsis may be life threatening. Sepsis can cause your blood pressure to drop. This may result in shock. Shock causes your central nervous system and your organs to stop working correctly.  RISK FACTORS Sepsis can happen in anyone, but it is more likely to happen in people who have weakened immune systems. SIGNS AND SYMPTOMS  Symptoms of sepsis can include:  Fever or low body temperature (hypothermia).  Rapid breathing (hyperventilation).  Chills.  Rapid heartbeat (tachycardia).  Confusion or light-headedness.  Trouble breathing.  Urinating much less than usual.  Cool, clammy skin or red, flushed skin.  Other problems with the heart, kidneys, or brain. DIAGNOSIS  Your health care provider will likely do tests to look for an infection, to see if the infection has spread to your blood, and to see how serious your condition is. Tests can include:  Blood tests, including cultures of your blood.  Cultures of other fluids from your body, such as:  Urine.  Pus from wounds.  Mucus coughed up from your lungs.  Urine tests other than cultures.  X-ray exams or other imaging tests. TREATMENT  Treatment will begin with elimination of the source of infection. If your sepsis is likely caused by a bacterial or fungal infection, you will be given antibiotic or antifungal medicines. You may also receive:  Oxygen.  Fluids through an IV tube.  Medicines to increase your blood pressure.  A machine to clean your blood (dialysis) if your kidneys fail.  A machine to help you breathe if your lungs fail. SEEK IMMEDIATE MEDICAL CARE IF: You get an infection or develop any of the signs and symptoms of sepsis after surgery or a  hospitalization.   This information is not intended to replace advice given to you by your health care provider. Make sure you discuss any questions you have with your health care provider.   Document Released: 04/20/2003 Document Revised: 12/06/2014 Document Reviewed: 03/29/2013 Elsevier Interactive Patient Education [employer] Elsevier Inc.

## 2016-04-09 NOTE — Care Management Important Message (Signed)
Important Message  Patient Details  Name: [name] MRN: [id number] Date of Birth: Jan 20, 1928   Medicare Important Message Given:  Yes    Kyla BalzarineShealy, Demian Maisel Abena 04/09/2016, 5:07 PM

## 2016-04-09 NOTE — Clinical Social Work Note (Signed)
Clinical Social Work Assessment  Patient Details  Name: [name] MRN: [id number] Date of Birth: 06/06/1928  Date of referral:  04/09/16               Reason for consult:  Facility Placement                Permission sought to share information with:  Facility Medical sales representativeContact Representative, Sports coachCase Manager, Family Supports Permission granted to share information::  Yes, Verbal Permission Granted  Name::      Nurse, children's([name])  Agency::   ([facility])  Relationship::   (sister in Social workerlaw (ex)/guardian)  Contact Information:     Housing/Transportation Living arrangements for the past 2 months:  Assisted Living Facility Source of Information:  Guardian Patient Interpreter Needed:  None Criminal Activity/Legal Involvement Pertinent to Current Situation/Hospitalization:  No - Comment as needed Significant Relationships:  Other(Comment) (guardian) Lives with:  Facility Resident Do you feel safe going back to the place where you live?  Yes Need for family participation in patient care:  Yes (Comment) (patient has guardian and dementia)  Care giving concerns:  Guardian wishes for patient to return to ALF   Social Worker assessment / plan:  CSW spoke with [name], patient's guardian who states the patient is her first husband's sister and she is her guardian.  [name] states the patient has no living children and one estranged grandchild.  [name] but will be driving up tonight to see the patient.  The patient has been discharged and the guardian is aware.  PT recommended SNF; however [name] wishes for [facility] to [name]. [facility] reviewed clinicals and is agreeable for patient to return with resuming home health therapies.  MD aware this needed to be included in the dc summary.  Employment status:  Retired Database administratornsurance information:  Managed Medicare PT Recommendations:  Skilled Nursing Facility Information / Referral to  community resources:   (ALF return)  Patient/Family's Response to care:  [name] is refusing SNF and wishes for patient to [name].  Patient/Family's Understanding of and Emotional Response to Diagnosis, Current Treatment, and Prognosis:  [name] expressed great guilt regarding not being able to be with patient 24/7.  CSW offered support.  Emotional Assessment Appearance:  Appears stated age Attitude/Demeanor/Rapport:    Affect (typically observed):  Accepting, Adaptable (sundowners) Orientation:  Oriented to Self Alcohol / Substance use:  Not Applicable Psych involvement (Current and /or in the community):  No (Comment)  Discharge Needs  Concerns to be addressed:  No discharge needs identified Readmission within the last 30 days:  No Current discharge risk:  None Barriers to Discharge:  No Barriers Identified   [name], Jenny Winton C, LCSW 04/09/2016, 1:37 PM

## 2016-04-09 NOTE — Care Management Note (Addendum)
Case Management Note  Patient Details  Name: [name] MRN: [id number] Date of Birth: 07-30-1928  Subjective/Objective:                 Patient admitted from ALF with sepsis.   Action/Plan:  Will DC to ALF as facilitated by CSW when medically stable. Will resume HH PT OT with [facility]  Expected Discharge Date:                  Expected Discharge Plan:  Skilled Nursing Facility  In-House Referral:  Clinical Social Work  Discharge planning Services  CM Consult  Post Acute Care Choice:  NA Choice offered to:  NA  DME Arranged:  N/A DME Agency:  NA  HH Arranged:  NA HH Agency:  NA  Status of Service:  Completed, signed off  If discussed at Long Length of Stay Meetings, dates discussed:    Additional Comments:  Lawerance SabalDebbie Sylus Stgermain, RN 04/09/2016, 9:21 AM

## 2016-04-09 NOTE — Clinical Social Work Note (Addendum)
1:33pm- CSW received notification from [name] that patient can return.  Guardian updated and agreeable this discharge plan. ALF wishes for MD to insert "PT/OT and RN therapies to resume at time of discharge" on the dc summary.. MD paged and made aware.  12:52pm- CSW spoke with [name] where [facility] is from.  Patient is being reviewed for possible return.  [facility] was [facility] therapy [facility] and guardian is requesting patient to return to ALF.  MD and RNCM both updated.  Vickii PennaGina Danton Palmateer, LCSW [phone]  [facility] [facility] Licensed Clinical Social Worker

## 2016-04-09 NOTE — Discharge Summary (Addendum)
Physician Discharge Summary  [name] DOB: [birth year] DOA: 04/05/2016  PCP: Scarlette Calico, MD  Admit date: 04/05/2016 Discharge date: 04/09/2016  Disposition: ALF  Resume Home health PT/OT and Home Health RN at discharge  Recommendations for Outpatient Follow-up:  1. Follow up with PCP in 1-2 weeks 2. Please obtain BMP/CBC in 1 week 3. Resume nightly CPAP if patient consents  Discharge Condition: STABLE CODE STATUS: DNR  Brief/Interim Summary: Hospital course: Jenny Dewald Tonkinsis a 82 y.o.femalewith medical history significant for coronary artery disease, hypertension, GERD, depression, and dementia who presents from her nursing home after being found unresponsive and with agonal respirations and severe sepsis.  Assessment & Plan:    1. Severe sepsis, suspected secondary to Aspiration pneumonia versus HCAP (from SNF) - Met criteria for severe sepsis on presentation and suspected source is respiratory given the acute hypoxic respiratory failure and RLL infiltrate; aspiration event likely given report from EMS.   - Blood and urine cultures are incubating - NGTD - 30 cc/kg NS bolus given  - Lactate markedly elevated to 9.75 initially, trending down now at 2  - Empiric abx with vancomycin and Zosyn initiated in ED, de-escalated to doxycycline starting 9/5 - clinically improved and lactate trending down, transferred to telemetry 9/2. Stable for DC to SNF 9/5.  2. Prerenal AKI - resolved with hydration - SCr 1.27 on admission, up from apparent baseline of 0.9  - Likely a prerenal azotemia in setting of sepsis  - She has received sepsis fluid boluses - Avoid nephrotoxins, DC Mobic, repeat chem panel shows improvement of AKI.   3. Metabolic acidosis, elevated AG  - pH 7.24, pCO2 47, pO2 67, serum bicarb 14, AG 19  - Likely secondary to sepsis  - resolved now.    4. Hyperglycemia  - Serum glucose 254 on admission with no hx of DM  - Likely secondary to sepsis   - Will check CBG q6h for now and institute and low-intensity sliding-scale correctional  - A1c pending, blood sugars much better controlled now.    CBG (last 3)   Recent Labs (last 2 labs)    Recent Labs  04/07/16 1154 04/07/16 1626 04/08/16 0823  GLUCAP 117* 99 100*     5. Hypertension  - BP has been at goal since admission - Managed with felodipine and metoprolol at the nursing home. Her cardiologist Dr. Johnsie Cancel increased her metoprolol to 50 mg po BID on 9/5. Pt should follow up with her cardiologist in 2-3 weeks.   - Hold the scheduled antihypertensives for now in setting of severe sepsis and concern for possible progression to shock    6. GERD  - Managed with Nexium at the nursing home  - EGD from 2013 with small hiatal hernia, no esophagitis  - Continue PPI   7. OSA - Continue CPAP qHS - Pt refused CPAP multiple times in hospital.  8. Dementia - Followed by neurology in outpatient setting, suspected to have Alzheimer's, and has been transitioning to a memory unit at the NH  - Continue Aricept, Namenda   9. CAD  - No anginal complaints - Admission EKG with some non-specific T-wave changes in the lateral leads  - Mild bump in troponin likely demand ischemia from respiratory distress  - Monitoring on telemetry, repeat EKG - Continue Lipitor and ASA 81  -Appreciate cardiology consult. Started back on plendil 9/4.  10. Nonspecific RUQ Abdominal Pain - US abdomen negative for serious acute findings. Monitor clinically. Regular Bowel movements  reported.  11. Hyperammonemia - ammonia level trended down. Confusion markedly improved.  12. Afib with RVR - acute onset, likely secondary to sepsis, acute illness, appreciate cardiology consultation, echo results below, continue metoprolol 50 mg twice daily.  Pt is not felt by cardiology to be a candidate for anticoagulation given her history of serious GI bleeding, Fall Risk, dementia and frailty.  TSH - WNL Echo  - results noted below Pt should follow up with her cardiologist Dr Johnsie Cancel in 3 weeks.   13.  Hypokalemia - repleted orally.   DVT prophylaxis:sq heparin Code Status:DNR, confirmed with legal guardian at time of admission Family Communication:Updated legal guardian, [name] by phone at [phone] - 9/3 and updated on 9/5 about discharge plans Disposition Plan:plan to discharge to SNF Consults called:cardiology  Admission status:Inpatient   Discharge Diagnoses:  Principal Problem:   Severe sepsis ([place]) Active Problems:   Atrial fibrillation with rapid ventricular response ([place])   Essential hypertension   Coronary atherosclerosis   GERD   Diverticulosis with h/o GI bleed x 2   OSA on CPAP   Moderate dementia without behavioral disturbance   Sepsis, unspecified organism ([place])   High anion gap metabolic acidosis   Hyperglycemia   AKI (acute kidney injury) ([place])   History of lower GI bleeding   High fever   Leukocytosis   Lactic acidosis  Discharge Instructions    Medication List    STOP taking these medications   meloxicam 7.5 MG tablet Commonly known as:  MOBIC   metoprolol succinate 50 MG 24 hr tablet Commonly known as:  TOPROL-XL     TAKE these medications   aspirin 81 MG tablet Take 81 mg by mouth daily. Reported on 01/17/2016   atorvastatin 40 MG tablet Commonly known as:  LIPITOR Take 1 tablet (40 mg total) by mouth daily.   CENTRUM SILVER ULTRA WOMENS PO Take 1 tablet by mouth daily.   donepezil 5 MG tablet Commonly known as:  ARICEPT Take 1 tablet (5 mg total) by mouth at bedtime. Reported on 01/17/2016   doxycycline 100 MG tablet Commonly known as:  VIBRA-TABS Take 1 tablet (100 mg total) by mouth every 12 (twelve) hours.   esomeprazole 40 MG capsule Commonly known as:  NEXIUM Take 1 capsule (40 mg total) by mouth daily. What changed:  when to take this   feeding supplement Liqd Take 237 mLs by mouth 2 (two) times daily  between meals.   felodipine 5 MG 24 hr tablet Commonly known as:  PLENDIL Take 1 tablet (5 mg total) by mouth daily. TAKE 1 TABLET (5 MG TOTAL) BY MOUTH DAILY. What changed:  additional instructions   memantine 28 MG Cp24 24 hr capsule Commonly known as:  NAMENDA XR After you finish starter pack, start taking 1 tablet daily What changed:  how much to take  how to take this  when to take this  additional instructions   metoprolol 50 MG tablet Commonly known as:  LOPRESSOR Take 1 tablet (50 mg total) by mouth 2 (two) times daily.   sertraline 50 MG tablet Commonly known as:  ZOLOFT Take 1 tablet (50 mg total) by mouth daily. What changed:  when to take this   XALATAN 0.005 % ophthalmic solution Generic drug:  latanoprost Place 1 drop into both eyes at bedtime.      Follow-up Information    Scarlette Calico, MD. Schedule an appointment as soon as possible for a visit in 1 week(s).   Specialty:  Internal Medicine Why:  Hospital Follow Up  Contact information: [address] [address] [phone]        Jenkins Rouge, MD. Schedule an appointment as soon as possible for a visit in 3 week(s).   Specialty:  Cardiology Why:  Hospital Follow Up Contact information: [address] [place] [address] [phone]          Allergies  Allergen Reactions  . Amoxicillin Itching    Chest and back  . Cephalexin Itching    Chest and back  . Penicillins Itching    Chest and back   Procedures/Studies: Ct Head Wo Contrast  Result Date: 04/05/2016 CLINICAL DATA:  Unresponsive with agonal respirations, pinpoint pupils. After Marcaine, patient began vomiting and became responsive to pain. Altered mental status. EXAM: CT HEAD WITHOUT CONTRAST TECHNIQUE: Contiguous axial images were obtained from the base of the skull through the vertex without intravenous contrast. COMPARISON:  09/05/2015 FINDINGS: Brain: Mild cerebral atrophy. Mild  ventricular dilatation consistent with central atrophy. Low-attenuation changes throughout the deep white matter consistent small vessel ischemia. No mass effect or midline shift. No abnormal extra-axial fluid collections. Gray-white matter junctions are distinct. Basal cisterns are not effaced. No evidence of acute intracranial hemorrhage. Vascular: Vascular calcifications. Skull: Hyperostosis frontalis.  No depressed skull fractures. Sinuses/Orbits: Increased density throughout the sphenoid and temporal bones, likely chronic. Possibly indicating metabolic disease. No change since prior study. Other: No significant changes since previous study. IMPRESSION: No acute intracranial abnormalities. Mild chronic atrophy and small vessel ischemic changes. Electronically Signed   By: Lucienne Capers M.D.   On: 04/05/2016 22:14   US Abdomen Complete  Result Date: 04/05/2016 CLINICAL DATA:  Abdominal tenderness, elevated transaminase levels, sepsis. Sent for 2 days. EXAM: ABDOMEN ULTRASOUND COMPLETE COMPARISON:  CT abdomen and pelvis 02/20/2012 FINDINGS: Gallbladder: Gallbladder is surgically absent. Common bile duct: Diameter: 4.4 mm, normal Liver: No focal lesion identified. Within normal limits in parenchymal echogenicity. IVC: No abnormality visualized. Pancreas: Limited visualization due to overlying bowel gas. Visualized portions of the body are unremarkable. Spleen: Size and appearance within normal limits. Right Kidney: Length: 10.1 cm. Mild diffuse parenchymal thinning. No hydronephrosis or mass identified. Left Kidney: Length: 10.7 cm. 8 mm cyst demonstrated in the lower pole. No hydronephrosis. Abdominal aorta: No aneurysm.  Aortic calcification. Other findings: Small right pleural effusion incidentally noted. IMPRESSION: Small right pleural effusion. Mild renal parenchymal atrophy likely representing chronic disease. Aortic atherosclerosis. Electronically Signed   By: Lucienne Capers M.D.   On: 04/05/2016  23:23   Dg Chest Port 1 View  Result Date: 04/07/2016 CLINICAL DATA:  Pneumonia, fever, leukocytosis EXAM: PORTABLE CHEST 1 VIEW COMPARISON:  04/05/2016 FINDINGS: Lungs are clear.  No pleural effusion or pneumothorax. Mild cardiomegaly. Degenerative changes of the bilateral shoulders, right greater than left. IMPRESSION: No evidence of acute cardiopulmonary disease. Electronically Signed   By: Julian Hy M.D.   On: 04/07/2016 08:19   Dg Chest Port 1 View  Result Date: 04/05/2016 CLINICAL DATA:  Found unresponsive,agonal respiration, pinpoint pupils, began vomiting and became responsive to pain after Narcan, history hypertension, coronary artery disease EXAM: PORTABLE CHEST 1 VIEW COMPARISON:  Portable exam 2020 hours compared to 05/13/2012 FINDINGS: Enlargement of cardiac silhouette with slight vascular congestion. Atherosclerotic calcification thoracic aorta. Minimal RIGHT basilar atelectasis versus infiltrate. Remaining lungs clear. No pleural effusion or pneumothorax. BILATERAL glenohumeral degenerative changes. IMPRESSION: Enlargement of cardiac silhouette with slight vascular congestion. Mild atelectasis versus infiltrate RIGHT  lower lobe. Aortic atherosclerosis. Electronically Signed   By: Lavonia Dana M.D.   On: 04/05/2016 20:29    Echo Study Conclusions  - Left ventricle: The cavity size was normal. Wall thickness was   increased in a pattern of moderate LVH. Systolic function was   mildly to moderately reduced. The estimated ejection fraction was   in the range of 40% to 45%. Features are consistent with a   pseudonormal left ventricular filling pattern, with concomitant   abnormal relaxation and increased filling pressure (grade 2   diastolic dysfunction). Doppler parameters are consistent with   high ventricular filling pressure.  Subjective: Pt without complaints.    Discharge Exam: Vitals:   04/09/16 0536 04/09/16 1103  BP: (!) 163/80 (!) 195/85  Pulse: (!) 35 89   Resp: 18 18  Temp: 98.4 F (36.9 C)    Vitals:   04/08/16 2134 04/08/16 2214 04/09/16 0536 04/09/16 1103  BP: (!) 199/82 (!) 163/86 (!) 163/80 (!) 195/85  Pulse:  79 (!) 35 89  Resp:   18 18  Temp:   98.4 F (36.9 C)   TempSrc:   Oral   SpO2:  97% 100% 98%  Weight:   66.1 kg (145 lb 11.6 oz)   Height:        Constitutional:Calm, chronically-ill appearing, awake, alert, no distress Eyes:PERTLA, lids and conjunctivae normal ENMT:Mucous membranes are dry. Posterior pharynx clear of any exudate or lesions.  Neck:normal, supple, no masses, no thyromegaly Respiratory:Coarse rhonchi at both bases, accessory muscle recruitment. No pallor or cyanosis.  Cardiovascular:S1 &S2 heard, regular rate and rhythm, no significant murmur. Trace b/l pretibial edema. No carotid bruits. No significant JVD. Abdomen:No distension, mild tenderness in right quadrants, no masses palpated. Bowel sounds normal.  Musculoskeletal:no clubbing / cyanosis. No joint deformity upper and lower extremities. Normal muscle tone.  Skin:no significant rashes, lesions, ulcers. Warm, dry, well-perfused. Neurologic:CN 2-12 grossly intact. Sensation intact, DTR normal. Strength 5/5 in all 4 limbs.  Psychiatric:Alert, oriented to person only.    The results of significant diagnostics from this hospitalization (including imaging, microbiology, ancillary and laboratory) are listed below for reference.     Microbiology: Recent Results (from the past 240 hour(s))  Blood Culture (routine x 2)     Status: None (Preliminary result)   Collection Time: 04/05/16  8:14 PM  Result Value Ref Range Status   Specimen Description BLOOD RIGHT FOREARM  Final   Special Requests IN PEDIATRIC BOTTLE 2CC  Final   Culture NO GROWTH 3 DAYS  Final   Report Status PENDING  Incomplete  Urine culture     Status: None   Collection Time: 04/05/16  8:44 PM  Result Value Ref Range Status   Specimen Description URINE, RANDOM  Final    Special Requests NONE  Final   Culture NO GROWTH  Final   Report Status 04/07/2016 FINAL  Final  Blood Culture (routine x 2)     Status: None (Preliminary result)   Collection Time: 04/05/16  9:16 PM  Result Value Ref Range Status   Specimen Description BLOOD RIGHT HAND  Final   Special Requests IN PEDIATRIC BOTTLE 3CC  Final   Culture NO GROWTH 3 DAYS  Final   Report Status PENDING  Incomplete  C difficile quick scan w PCR reflex     Status: None   Collection Time: 04/06/16 12:19 AM  Result Value Ref Range Status   C Diff antigen NEGATIVE NEGATIVE Final   C Diff toxin NEGATIVE  NEGATIVE Final   C Diff interpretation No C. difficile detected.  Final  MRSA PCR Screening     Status: None   Collection Time: 04/06/16 12:27 AM  Result Value Ref Range Status   MRSA by PCR NEGATIVE NEGATIVE Final    Comment:        The GeneXpert MRSA Assay (FDA approved for NASAL specimens only), is one component of a comprehensive MRSA colonization surveillance program. It is not intended to diagnose MRSA infection nor to guide or monitor treatment for MRSA infections.      Labs: BNP (last 3 results) No results for input(s): BNP in the last 8760 hours. Basic Metabolic Panel:  Recent Labs Lab 04/05/16 2014 04/05/16 2024 04/06/16 0123 04/07/16 0440 04/08/16 0327  NA 135 136 141 139 139  K 4.0 4.1 3.6 2.9* 3.4*  CL 102 108 110 109 111  CO2 14*  --  20* 23 24  GLUCOSE 254* 249* 112* 98 100*  BUN 17 21* '15 11 11  '$ CREATININE 1.27* 1.00 0.99 1.09* 1.12*  CALCIUM 8.9  --  8.3* 8.4* 9.0   Liver Function Tests:  Recent Labs Lab 04/05/16 2014 04/06/16 0123 04/07/16 0440  AST 82* 77* 69*  ALT 36 37 35  ALKPHOS 111 84 73  BILITOT 0.9 0.8 1.2  PROT 7.8 6.8 5.8*  ALBUMIN 3.7 3.3* 2.9*   No results for input(s): LIPASE, AMYLASE in the last 168 hours.  Recent Labs Lab 04/05/16 2015 04/06/16 0244  AMMONIA 72* 67*   CBC:  Recent Labs Lab 04/05/16 2014 04/05/16 2024  04/06/16 0123 04/07/16 0440 04/08/16 0327  WBC 17.5*  --  20.9* 17.8* 11.1*  NEUTROABS 11.7*  --  18.3*  --   --   HGB 13.8 16.0* 13.3 11.7* 11.5*  HCT 44.7 47.0* 41.9 38.0 38.4  MCV 90.9  --  89.0 89.6 89.9  PLT 385  --  300 251 273   Cardiac Enzymes:  Recent Labs Lab 04/05/16 2014  04/06/16 0123 04/06/16 0848 04/06/16 1704 04/07/16 0004 04/07/16 0440  CKTOTAL 132  --   --   --   --   --   --   TROPONINI  --   < > 0.18* 0.20* 0.19* 0.70* 0.91*  < > = values in this interval not displayed. BNP: Invalid input(s): POCBNP CBG:  Recent Labs Lab 04/07/16 1626 04/08/16 0823 04/08/16 1300 04/08/16 2125 04/09/16 0745  GLUCAP 99 100* 111* 98 95   D-Dimer No results for input(s): DDIMER in the last 72 hours. Hgb A1c No results for input(s): HGBA1C in the last 72 hours. Lipid Profile No results for input(s): CHOL, HDL, LDLCALC, TRIG, CHOLHDL, LDLDIRECT in the last 72 hours. Thyroid function studies  Recent Labs  04/06/16 1708  TSH 0.528   Anemia work up No results for input(s): VITAMINB12, FOLATE, FERRITIN, TIBC, IRON, RETICCTPCT in the last 72 hours. Urinalysis    Component Value Date/Time   COLORURINE YELLOW 04/05/2016 2044   APPEARANCEUR CLEAR 04/05/2016 2044   LABSPEC 1.018 04/05/2016 2044   PHURINE 6.0 04/05/2016 2044   GLUCOSEU NEGATIVE 04/05/2016 2044   GLUCOSEU NEGATIVE 11/14/2014 1047   HGBUR MODERATE (A) 04/05/2016 2044   BILIRUBINUR NEGATIVE 04/05/2016 2044   KETONESUR NEGATIVE 04/05/2016 2044   PROTEINUR 100 (A) 04/05/2016 2044   UROBILINOGEN 0.2 11/14/2014 1047   NITRITE NEGATIVE 04/05/2016 2044   LEUKOCYTESUR NEGATIVE 04/05/2016 2044   Sepsis Labs Invalid input(s): PROCALCITONIN,  WBC,  LACTICIDVEN Microbiology Recent Results (from the past  240 hour(s))  Blood Culture (routine x 2)     Status: None (Preliminary result)   Collection Time: 04/05/16  8:14 PM  Result Value Ref Range Status   Specimen Description BLOOD RIGHT FOREARM  Final    Special Requests IN PEDIATRIC BOTTLE 2CC  Final   Culture NO GROWTH 3 DAYS  Final   Report Status PENDING  Incomplete  Urine culture     Status: None   Collection Time: 04/05/16  8:44 PM  Result Value Ref Range Status   Specimen Description URINE, RANDOM  Final   Special Requests NONE  Final   Culture NO GROWTH  Final   Report Status 04/07/2016 FINAL  Final  Blood Culture (routine x 2)     Status: None (Preliminary result)   Collection Time: 04/05/16  9:16 PM  Result Value Ref Range Status   Specimen Description BLOOD RIGHT HAND  Final   Special Requests IN PEDIATRIC BOTTLE 3CC  Final   Culture NO GROWTH 3 DAYS  Final   Report Status PENDING  Incomplete  C difficile quick scan w PCR reflex     Status: None   Collection Time: 04/06/16 12:19 AM  Result Value Ref Range Status   C Diff antigen NEGATIVE NEGATIVE Final   C Diff toxin NEGATIVE NEGATIVE Final   C Diff interpretation No C. difficile detected.  Final  MRSA PCR Screening     Status: None   Collection Time: 04/06/16 12:27 AM  Result Value Ref Range Status   MRSA by PCR NEGATIVE NEGATIVE Final    Comment:        The GeneXpert MRSA Assay (FDA approved for NASAL specimens only), is one component of a comprehensive MRSA colonization surveillance program. It is not intended to diagnose MRSA infection nor to guide or monitor treatment for MRSA infections.    Time coordinating discharge:  31 minutes  SIGNED:  Irwin Brakeman, MD  [facility] 04/09/2016, 12:00 PM Pager   If 7PM-7AM, please contact night-coverage [email or web address] Password [email or web address]

## 2016-04-09 NOTE — Progress Notes (Signed)
Patient ID: [name], female   DOB: 1927-08-27, 82 y.o.   MRN: [id number] Patient ID: [name], female   DOB: Jan 06, 1928, 82 y.o.   MRN: [id number]    Subjective:  Denies SSCP, palpitations or Dyspnea   Objective:  Vitals:   04/08/16 2133 04/08/16 2134 04/08/16 2214 04/09/16 0536  BP: (!) 204/75 (!) 199/82 (!) 163/86 (!) 163/80  Pulse: 70  79 (!) 35  Resp: 18   18  Temp: 98.8 F (37.1 C)   98.4 F (36.9 C)  TempSrc: Oral   Oral  SpO2: 97%  97% 100%  Weight:    145 lb 11.6 oz (66.1 kg)  Height:        Intake/Output from previous day:  Intake/Output Summary (Last 24 hours) at 04/09/16 1055 Last data filed at 04/09/16 0929  Gross per 24 hour  Intake              150 ml  Output             1450 ml  Net            -1300 ml    Physical Exam: Affect appropriate Healthy:  appears stated age HEENT: normal Neck supple with no adenopathy JVP normal no bruits no thyromegaly Lungs clear with no wheezing and good diaphragmatic motion Heart:  S1/S2 no murmur, no rub, gallop or click PMI normal Abdomen: benighn, BS positve, no tenderness, no AAA no bruit.  No HSM or HJR Distal pulses intact with no bruits No edema Neuro non-focal Skin warm and dry No muscular weakness   Lab Results: Basic Metabolic Panel:  Recent Labs  33/29/51 0440 04/08/16 0327  NA 139 139  K 2.9* 3.4*  CL 109 111  CO2 23 24  GLUCOSE 98 100*  BUN 11 11  CREATININE 1.09* 1.12*  CALCIUM 8.4* 9.0   Liver Function Tests:  Recent Labs  04/07/16 0440  AST 69*  ALT 35  ALKPHOS 73  BILITOT 1.2  PROT 5.8*  ALBUMIN 2.9*   No results for input(s): LIPASE, AMYLASE in the last 72 hours. CBC:  Recent Labs  04/07/16 0440 04/08/16 0327  WBC 17.8* 11.1*  HGB 11.7* 11.5*  HCT 38.0 38.4  MCV 89.6 89.9  PLT 251 273   Cardiac Enzymes:  Recent Labs  04/06/16 1704 04/07/16 0004 04/07/16 0440  TROPONINI 0.19* 0.70* 0.91*   BNP: Invalid input(s): POCBNP D-Dimer: No results  for input(s): DDIMER in the last 72 hours. Hemoglobin A1C: No results for input(s): HGBA1C in the last 72 hours. Fasting Lipid Panel: No results for input(s): CHOL, HDL, LDLCALC, TRIG, CHOLHDL, LDLDIRECT in the last 72 hours. Thyroid Function Tests:  Recent Labs  04/06/16 1708  TSH 0.528   Anemia Panel: No results for input(s): VITAMINB12, FOLATE, FERRITIN, TIBC, IRON, RETICCTPCT in the last 72 hours.  Imaging: No results found.  Cardiac Studies:  ECG: SR LVH no acute changes    Telemetry:  NSR PAC  04/09/2016   Echo:  Pending   Medications:   . aspirin EC  81 mg Oral Daily  . atorvastatin  40 mg Oral Daily  . donepezil  5 mg Oral QHS  . doxycycline  100 mg Oral Q12H  . enoxaparin (LOVENOX) injection  40 mg Subcutaneous Q24H  . feeding supplement  1 Container Oral BID BM  . felodipine  5 mg Oral Daily  . insulin aspart  0-9 Units Subcutaneous TID WC  . lactulose  20 g  Oral BID  . latanoprost  1 drop Both Eyes QHS  . mouth rinse  15 mL Mouth Rinse BID  . memantine  28 mg Oral Daily  . metoprolol succinate  50 mg Oral Daily  . multivitamin with minerals  1 tablet Oral Daily  . pantoprazole  40 mg Oral Daily  . sertraline  50 mg Oral Daily  . sodium chloride flush  3 mL Intravenous Q12H       Assessment/Plan:  PAF:  Short lived related to sepsis and age. Agree with SM given age, dementia and comorbidities would not Anticoagulate at this time Telemetry with frequent PACls/ PVC;s increase beta blocker and change to bid HTN:  plendil aded back yesterday some better  Sepsis:  On zosyn WBC improved afebrile plan per primary service   Charlton Haws 04/09/2016, 10:55 AM

## 2016-04-09 NOTE — NC FL2 (Signed)
[place] MEDICAID FL2 LEVEL OF CARE SCREENING TOOL     IDENTIFICATION  Patient Name: [name] Birthdate: September 17, 1927 Sex: female Admission Date (Current Location): 04/05/2016  [facility] [place] and [place] Number:  Producer, television/film/video and Address:  [facility], [address] [place], [place] [address]      Provider Number: [id number]  Attending Physician Name and Address:  Cleora Fleet, MD  Relative Name and Phone Number:       Current Level of Care: Hospital Recommended Level of Care: Skilled Nursing Facility Prior Approval Number:    Date Approved/Denied:   PASRR Number: [id number]  Discharge Plan: SNF    Current Diagnoses: Patient Active Problem List   Diagnosis Date Noted  . Lactic acidosis   . Leukocytosis   . Atrial fibrillation with rapid ventricular response (HCC) 04/06/2016  . High fever 04/06/2016  . History of lower GI bleeding   . Sepsis, unspecified organism (HCC) 04/05/2016  . High anion gap metabolic acidosis 04/05/2016  . Severe sepsis (HCC) 04/05/2016  . Hyperglycemia 04/05/2016  . AKI (acute kidney injury) (HCC) 04/05/2016  . Acute respiratory failure with hypoxia (HCC)   . Elevated transaminase level   . Depression due to dementia 01/17/2016  . Moderate dementia without behavioral disturbance 08/29/2015  . Routine general medical examination at a health care facility 12/09/2013  . OSA on CPAP 05/25/2012  . Vitamin D deficiency 08/29/2008  . GERD 03/10/2008  . Diverticulosis with h/o GI bleed x 2 09/02/2007  . Osteoarthritis 09/02/2007  . Low back pain with left-sided sciatica 09/02/2007  . Hyperlipidemia with target LDL less than 100 09/01/2007  . Essential hypertension 09/01/2007  . Coronary atherosclerosis 09/01/2007    Orientation RESPIRATION BLADDER Height & Weight     Self, Time  Normal Continent Weight: 145 lb 11.6 oz (66.1 kg) Height:  5\' 4"  (162.6 cm)  BEHAVIORAL SYMPTOMS/MOOD NEUROLOGICAL BOWEL  NUTRITION STATUS      Continent    AMBULATORY STATUS COMMUNICATION OF NEEDS Skin     Verbally Normal                       Personal Care Assistance Level of Assistance  Bathing, Dressing Bathing Assistance: Limited assistance   Dressing Assistance: Limited assistance     Functional Limitations Info             SPECIAL CARE FACTORS FREQUENCY  PT (By licensed PT), OT (By licensed OT)     PT Frequency: daily OT Frequency: daily            Contractures Contractures Info: Not present    Additional Factors Info  Code Status, Allergies Code Status Info: DNR Allergies Info: Amoxicillin, Cephalexin, Penicillins           Discharge Medications: STOP taking these medications   meloxicam 7.5 MG tablet Commonly known as:  MOBIC  metoprolol succinate 50 MG 24 hr tablet Commonly known as:  TOPROL-XL    TAKE these medications   aspirin 81 MG tablet Take 81 mg by mouth daily. Reported on 01/17/2016  atorvastatin 40 MG tablet Commonly known as:  LIPITOR Take 1 tablet (40 mg total) by mouth daily.  CENTRUM SILVER ULTRA WOMENS PO Take 1 tablet by mouth daily.  donepezil 5 MG tablet Commonly known as:  ARICEPT Take 1 tablet (5 mg total) by mouth at bedtime. Reported on 01/17/2016  doxycycline 100 MG tablet Commonly known as:  VIBRA-TABS Take 1  tablet (100 mg total) by mouth every 12 (twelve) hours.  esomeprazole 40 MG capsule Commonly known as:  NEXIUM Take 1 capsule (40 mg total) by mouth daily. What changed:  when to take this  feeding supplement Liqd Take 237 mLs by mouth 2 (two) times daily between meals.  felodipine 5 MG 24 hr tablet Commonly known as:  PLENDIL Take 1 tablet (5 mg total) by mouth daily. TAKE 1 TABLET (5 MG TOTAL) BY MOUTH DAILY. What changed:  additional instructions  memantine 28 MG Cp24 24 hr capsule Commonly known as:  NAMENDA XR After you finish starter pack, start taking 1 tablet daily What changed:  how much to take  how to  take this  when to take this  additional instructions  metoprolol 50 MG tablet Commonly known as:  LOPRESSOR Take 1 tablet (50 mg total) by mouth 2 (two) times daily.  sertraline 50 MG tablet Commonly known as:  ZOLOFT Take 1 tablet (50 mg total) by mouth daily. What changed:  when to take this  XALATAN 0.005 % ophthalmic solution Generic drug:  latanoprost Place 1 drop into both eyes at bedtime.    Relevant Imaging Results:  Relevant Lab Results:   Additional Information SSN: [id number]  Rondel Baton, LCSW

## 2016-04-09 NOTE — Progress Notes (Signed)
Nsg Discharge Note  Admit Date:  04/05/2016 Discharge date: 04/09/2016   [name] to be D/C'd Skilled nursing facility per MD order.  AVS completed.  Copy for chart, and copy for patient signed, and dated. Patient/caregiver able to verbalize understanding.  Discharge Medication:   Medication List    STOP taking these medications   meloxicam 7.5 MG tablet Commonly known as:  MOBIC   metoprolol succinate 50 MG 24 hr tablet Commonly known as:  TOPROL-XL     TAKE these medications   aspirin 81 MG tablet Take 81 mg by mouth daily. Reported on 01/17/2016   atorvastatin 40 MG tablet Commonly known as:  LIPITOR Take 1 tablet (40 mg total) by mouth daily.   CENTRUM SILVER ULTRA WOMENS PO Take 1 tablet by mouth daily.   donepezil 5 MG tablet Commonly known as:  ARICEPT Take 1 tablet (5 mg total) by mouth at bedtime. Reported on 01/17/2016   doxycycline 100 MG tablet Commonly known as:  VIBRA-TABS Take 1 tablet (100 mg total) by mouth every 12 (twelve) hours.   esomeprazole 40 MG capsule Commonly known as:  NEXIUM Take 1 capsule (40 mg total) by mouth daily. What changed:  when to take this   feeding supplement Liqd Take 237 mLs by mouth 2 (two) times daily between meals.   felodipine 5 MG 24 hr tablet Commonly known as:  PLENDIL Take 1 tablet (5 mg total) by mouth daily. TAKE 1 TABLET (5 MG TOTAL) BY MOUTH DAILY. What changed:  additional instructions   memantine 28 MG Cp24 24 hr capsule Commonly known as:  NAMENDA XR After you finish starter pack, start taking 1 tablet daily What changed:  how much to take  how to take this  when to take this  additional instructions   metoprolol 50 MG tablet Commonly known as:  LOPRESSOR Take 1 tablet (50 mg total) by mouth 2 (two) times daily.   sertraline 50 MG tablet Commonly known as:  ZOLOFT Take 1 tablet (50 mg total) by mouth daily. What changed:  when to take this   XALATAN 0.005 % ophthalmic  solution Generic drug:  latanoprost Place 1 drop into both eyes at bedtime.       Discharge Assessment: Vitals:   04/09/16 1103 04/09/16 1426  BP: (!) 195/85 122/63  Pulse: 89 (!) 103  Resp: 18 18  Temp:     Skin clean, dry and intact without evidence of skin break down, no evidence of skin tears noted. IV catheter discontinued intact. Site without signs and symptoms of complications - no redness or edema noted at insertion site, patient denies c/o pain - only slight tenderness at site.  Dressing with slight pressure applied.  D/c Instructions-Education: Discharge instructions given to patient/family with verbalized understanding. D/c education completed with patient/family including follow up instructions, medication list, d/c activities limitations if indicated, with other d/c instructions as indicated by MD - patient able to verbalize understanding, all questions fully answered. Patient instructed to return to ED, call 911, or call MD for any changes in condition.  Patient transported via [facility] to [facility]. Report given to nurse at facility  Camillo FlamingVicki L Adarsh Mundorf, RN 04/09/2016 4:10 PM

## 2016-04-10 ENCOUNTER — Encounter (HOSPITAL_COMMUNITY): Payer: Self-pay

## 2016-04-10 ENCOUNTER — Emergency Department (HOSPITAL_COMMUNITY)
Admission: EM | Admit: 2016-04-10 | Discharge: 2016-04-10 | Disposition: A | Discharge status: Home / Self Care | Payer: Medicare Other | Attending: Emergency Medicine | Admitting: Emergency Medicine

## 2016-04-10 DIAGNOSIS — I10 Essential (primary) hypertension: Secondary | ICD-10-CM | POA: Diagnosis not present

## 2016-04-10 DIAGNOSIS — R195 Other fecal abnormalities: Secondary | ICD-10-CM | POA: Diagnosis not present

## 2016-04-10 DIAGNOSIS — Z7982 Long term (current) use of aspirin: Secondary | ICD-10-CM | POA: Insufficient documentation

## 2016-04-10 DIAGNOSIS — Z79899 Other long term (current) drug therapy: Secondary | ICD-10-CM | POA: Diagnosis not present

## 2016-04-10 DIAGNOSIS — K625 Hemorrhage of anus and rectum: Secondary | ICD-10-CM | POA: Diagnosis present

## 2016-04-10 DIAGNOSIS — I2510 Atherosclerotic heart disease of native coronary artery without angina pectoris: Secondary | ICD-10-CM | POA: Diagnosis not present

## 2016-04-10 LAB — CULTURE, BLOOD (ROUTINE X 2)
CULTURE: NO GROWTH
CULTURE: NO GROWTH

## 2016-04-10 LAB — TYPE AND SCREEN
ABO/RH(D): O POS
Antibody Screen: NEGATIVE

## 2016-04-10 LAB — CBC WITH DIFFERENTIAL/PLATELET
BASOS ABS: 0 10*3/uL (ref 0.0–0.1)
BASOS PCT: 0 %
EOS PCT: 1 %
Eosinophils Absolute: 0.1 10*3/uL (ref 0.0–0.7)
HEMATOCRIT: 40 % (ref 36.0–46.0)
Hemoglobin: 13.1 g/dL (ref 12.0–15.0)
LYMPHS PCT: 16 %
Lymphs Abs: 1.6 10*3/uL (ref 0.7–4.0)
MCH: 27.9 pg (ref 26.0–34.0)
MCHC: 32.8 g/dL (ref 30.0–36.0)
MCV: 85.1 fL (ref 78.0–100.0)
MONO ABS: 1.4 10*3/uL — AB (ref 0.1–1.0)
MONOS PCT: 14 %
Neutro Abs: 6.7 10*3/uL (ref 1.7–7.7)
Neutrophils Relative %: 69 %
PLATELETS: 352 10*3/uL (ref 150–400)
RBC: 4.7 MIL/uL (ref 3.87–5.11)
RDW: 15.8 % — AB (ref 11.5–15.5)
WBC: 9.8 10*3/uL (ref 4.0–10.5)

## 2016-04-10 LAB — COMPREHENSIVE METABOLIC PANEL
ALBUMIN: 3.4 g/dL — AB (ref 3.5–5.0)
ALT: 34 U/L (ref 14–54)
AST: 63 U/L — AB (ref 15–41)
Alkaline Phosphatase: 79 U/L (ref 38–126)
Anion gap: 5 (ref 5–15)
BILIRUBIN TOTAL: 1.7 mg/dL — AB (ref 0.3–1.2)
BUN: 13 mg/dL (ref 6–20)
CALCIUM: 9.6 mg/dL (ref 8.9–10.3)
CO2: 26 mmol/L (ref 22–32)
Chloride: 108 mmol/L (ref 101–111)
Creatinine, Ser: 1.28 mg/dL — ABNORMAL HIGH (ref 0.44–1.00)
GFR calc Af Amer: 43 mL/min — ABNORMAL LOW (ref >60)
GFR calc non Af Amer: 37 mL/min — ABNORMAL LOW (ref >60)
GLUCOSE: 101 mg/dL — AB (ref 65–99)
Potassium: 4.5 mmol/L (ref 3.5–5.1)
SODIUM: 139 mmol/L (ref 135–145)
TOTAL PROTEIN: 7.6 g/dL (ref 6.5–8.1)

## 2016-04-10 LAB — PROTIME-INR
INR: 1.1
Prothrombin Time: 14.3 seconds (ref 11.4–15.2)

## 2016-04-10 LAB — POC OCCULT BLOOD, ED: FECAL OCCULT BLD: NEGATIVE

## 2016-04-10 MED ORDER — SODIUM CHLORIDE 0.9 % IV SOLN
INTRAVENOUS | Status: DC
  Administered 2016-04-10: 14:00:00 via INTRAVENOUS

## 2016-04-10 NOTE — ED Provider Notes (Addendum)
[facility]-EMERGENCY DEPT Provider Note   CSN: [id number] Arrival date & time: 04/10/16  1152     History   Chief Complaint Chief Complaint  Patient presents with  . Rectal Bleeding    HPI [name] is a 82 y.o. female.  HPI Per nursing notes "Per [facility] pt from [facility] [place] . Pt was recently admitted for pneumonia , per staff pt had bloody BM today with clots. Hx dementia. Alert and oriented to self only normal to baseline. Denies abd pain nor emesis. Pt takes aspirin daily. Per staff pt had her morning meds"  Pt does not recall having any blood in her stool.  No complaints of abdominal pain.  No complaints of fever.  Not feeling lightheaded.  Past Medical History:  Diagnosis Date  . Abnormality of gait   . Anemia   . Anxiety   . Bronchitis, mucopurulent recurrent (HCC)   . CAD (coronary artery disease)   . Chronic osteomyelitis, other specified site   . Depression   . Diverticulosis of colon   . DJD (degenerative joint disease)   . Dysrhythmia    OCC HEART PALPITATION DR [name]  . GERD (gastroesophageal reflux disease)   . Glaucoma   . Hearing loss   . Hiatal hernia   . History of lower GI bleeding    multiple episodes secondary to diverticulosis  . Hx of adenomatous colonic polyps   . Hypercholesteremia   . Hypertension   . Lumbar back pain   . OSA (obstructive sleep apnea)    CPAP  MANY YRS AGO NOT SURE WHERE DONE  . Palpitations   . Personal history of other diseases of digestive system   . Thyroid nodule   . Vitamin D deficiency     Patient Active Problem List   Diagnosis Date Noted  . Lactic acidosis   . Leukocytosis   . Atrial fibrillation with rapid ventricular response (HCC) 04/06/2016  . High fever 04/06/2016  . History of lower GI bleeding   . Sepsis, unspecified organism (HCC) 04/05/2016  . High anion gap metabolic acidosis 04/05/2016  . Severe sepsis (HCC) 04/05/2016  . Hyperglycemia 04/05/2016  . AKI (acute kidney injury) (HCC)  04/05/2016  . Acute respiratory failure with hypoxia (HCC)   . Elevated transaminase level   . Depression due to dementia 01/17/2016  . Moderate dementia without behavioral disturbance 08/29/2015  . Routine general medical examination at a health care facility 12/09/2013  . OSA on CPAP 05/25/2012  . Vitamin D deficiency 08/29/2008  . GERD 03/10/2008  . Diverticulosis with h/o GI bleed x 2 09/02/2007  . Osteoarthritis 09/02/2007  . Low back pain with left-sided sciatica 09/02/2007  . Hyperlipidemia with target LDL less than 100 09/01/2007  . Essential hypertension 09/01/2007  . Coronary atherosclerosis 09/01/2007    Past Surgical History:  Procedure Laterality Date  . ABDOMINAL HYSTERECTOMY    . APPENDECTOMY    . CATARACT EXTRACTION    . CHOLECYSTECTOMY    . INGUINAL HERNIA REPAIR  05/21/2012   WITH MESH   . TOTAL KNEE ARTHROPLASTY     left  . TOTAL KNEE ARTHROPLASTY     right  . VENTRAL HERNIA REPAIR  05/21/2012   Procedure: LAPAROSCOPIC VENTRAL HERNIA;  Surgeon: Atilano Ina, MD,FACS;  Location: [facility] OR;  Service: General;  Laterality: N/A;    OB History    No data available       Home Medications    Prior to Admission medications  Medication Sig Start Date End Date Taking? Authorizing Provider  aspirin 81 MG tablet Take 81 mg by mouth daily. Reported on 01/17/2016   Yes Historical Provider, MD  atorvastatin (LIPITOR) 40 MG tablet Take 1 tablet (40 mg total) by mouth daily. Patient taking differently: Take 40 mg by mouth every evening.  01/25/16  Yes Etta Grandchildhomas L Jones, MD  donepezil (ARICEPT) 5 MG tablet Take 1 tablet (5 mg total) by mouth at bedtime. Reported on 01/17/2016 01/25/16  Yes Etta Grandchildhomas L Jones, MD  esomeprazole (NEXIUM) 40 MG capsule Take 1 capsule (40 mg total) by mouth daily. Patient taking differently: Take 40 mg by mouth daily at 12 noon.  01/25/16  Yes Etta Grandchildhomas L Jones, MD  feeding supplement (BOOST HIGH PROTEIN) LIQD Take 237 mLs by mouth 2 (two) times daily  between meals. 06/21/15  Yes Etta Grandchildhomas L Jones, MD  felodipine (PLENDIL) 5 MG 24 hr tablet Take 1 tablet (5 mg total) by mouth daily. TAKE 1 TABLET (5 MG TOTAL) BY MOUTH DAILY. Patient taking differently: Take 5 mg by mouth daily.  01/25/16  Yes Etta Grandchildhomas L Jones, MD  latanoprost (XALATAN) 0.005 % ophthalmic solution Place 1 drop into both eyes at bedtime.  12/21/15  Yes Historical Provider, MD  memantine (NAMENDA XR) 28 MG CP24 24 hr capsule After you finish starter pack, start taking 1 tablet daily Patient taking differently: Take 28 mg by mouth daily.  01/25/16  Yes Etta Grandchildhomas L Jones, MD  metoprolol (LOPRESSOR) 50 MG tablet Take 1 tablet (50 mg total) by mouth 2 (two) times daily. 04/09/16  Yes Clanford Cyndie MullL Johnson, MD  Multiple Vitamins-Minerals (CENTRUM SILVER ULTRA WOMENS PO) Take 1 tablet by mouth daily.   Yes Historical Provider, MD  sertraline (ZOLOFT) 50 MG tablet Take 1 tablet (50 mg total) by mouth daily. Patient taking differently: Take 50 mg by mouth at bedtime.  01/25/16  Yes Etta Grandchildhomas L Jones, MD  doxycycline (VIBRA-TABS) 100 MG tablet Take 1 tablet (100 mg total) by mouth every 12 (twelve) hours. 04/09/16 04/13/16  Clanford Cyndie MullL Johnson, MD    Family History Family History  Problem Relation Age of Onset  . Family history unknown: Yes    Social History Social History  Substance Use Topics  . Smoking status: Never Smoker  . Smokeless tobacco: Never Used  . Alcohol use No     Allergies   Amoxicillin; Cephalexin; and Penicillins   Review of Systems Review of Systems  All other systems reviewed and are negative.    Physical Exam Updated Vital Signs BP (!) 135/101   Pulse 83   Temp 98.8 F (37.1 C) (Oral)   Resp 14   SpO2 91%   Physical Exam  Constitutional: She appears well-developed and well-nourished. No distress.  Elderly   HENT:  Head: Normocephalic and atraumatic.  Right Ear: External ear normal.  Left Ear: External ear normal.  Eyes: Conjunctivae are normal. Right eye  exhibits no discharge. Left eye exhibits no discharge. No scleral icterus.  Neck: Neck supple. No tracheal deviation present.  Cardiovascular: Normal rate.   Pulmonary/Chest: Effort normal. No stridor. No respiratory distress.  Abdominal: She exhibits no distension.  Genitourinary: Rectal exam shows guaiac negative stool.  Genitourinary Comments: Stool red, no mass  Musculoskeletal: She exhibits no edema.  Neurological: She is alert. Cranial nerve deficit: no gross deficits.  Skin: Skin is warm and dry. No rash noted.  Psychiatric: She has a normal mood and affect.  Nursing note and vitals reviewed.  ED Treatments / Results  Labs (all labs ordered are listed, but only abnormal results are displayed) Labs Reviewed  COMPREHENSIVE METABOLIC PANEL - Abnormal; Notable for the following:       Result Value   Glucose, Bld 101 (*)    Creatinine, Ser 1.28 (*)    Albumin 3.4 (*)    AST 63 (*)    Total Bilirubin 1.7 (*)    GFR calc non Af Amer 37 (*)    GFR calc Af Amer 43 (*)    All other components within normal limits  CBC WITH DIFFERENTIAL/PLATELET - Abnormal; Notable for the following:    RDW 15.8 (*)    Monocytes Absolute 1.4 (*)    All other components within normal limits  PROTIME-INR  POC OCCULT BLOOD, ED  TYPE AND SCREEN   Procedures Procedures (including critical care time)  Medications Ordered in ED Medications  0.9 %  sodium chloride infusion ( Intravenous Stopped 04/10/16 1541)     Initial Impression / Assessment and Plan / ED Course  I have reviewed the triage vital signs and the nursing notes.  Pertinent labs & imaging results that were available during my care of the patient were reviewed by me and considered in my medical decision making (see chart for details).  Clinical Course    The patient's laboratory tests are reassuring. She is not anemic. Her fecal occult test was actually negative for blood despite the red discoloration. I suspect her stool  discoloration could be from something in the diet. I can't really say for certain because I can't information about her diet because of her dementia. I think she is stable for discharge back to her nursing facility  Final Clinical Impressions(s) / ED Diagnoses   Final diagnoses:  Stool color abnormal    New Prescriptions New Prescriptions   No medications on file     Linwood Dibbles, MD 04/10/16 1548    Linwood Dibbles, MD 04/10/16 1549

## 2016-04-10 NOTE — ED Triage Notes (Signed)
[facility] . Pt was recently admitted for pneumonia , per staff pt had bloody BM today with clots. Hx dementia. Alert and oriented to self only normal to baseline. Denies abd pain nor emesis. Pt takes aspirin daily. Per staff pt had her morning meds.

## 2016-04-10 NOTE — Discharge Instructions (Signed)
The stool test was negative for blood. Blood tests were normal. It is possible the stool discoloration is from something in the diet but there does not appear to be any evidence of intestinal bleeding.

## 2016-04-11 NOTE — Telephone Encounter (Signed)
Pt call stating rx for rolling walker never get to the [facility]. Can we refax again to [phone] att Christel MormonZee with the doc signature and NPI #. Please call [name] [phone] back today.

## 2016-04-11 NOTE — Telephone Encounter (Signed)
I still have gthe order. Will you fax from machine upfront to get a confirmation please.

## 2016-04-20 ENCOUNTER — Telehealth: Payer: Self-pay

## 2016-04-20 NOTE — Telephone Encounter (Signed)
recvd fax from [facility] requesting okay to administer pneumo and flu vaccines. Pt is not due for pneumo but is due for flu vaccine. Fax returned to [facility] stating same and signed by PCP.

## 2016-05-28 DIAGNOSIS — Z7982 Long term (current) use of aspirin: Secondary | ICD-10-CM

## 2016-05-28 DIAGNOSIS — I252 Old myocardial infarction: Secondary | ICD-10-CM

## 2016-05-28 DIAGNOSIS — Z7984 Long term (current) use of oral hypoglycemic drugs: Secondary | ICD-10-CM

## 2016-05-28 DIAGNOSIS — I255 Ischemic cardiomyopathy: Secondary | ICD-10-CM

## 2016-05-28 DIAGNOSIS — E1165 Type 2 diabetes mellitus with hyperglycemia: Secondary | ICD-10-CM | POA: Diagnosis not present

## 2016-05-28 DIAGNOSIS — I5043 Acute on chronic combined systolic (congestive) and diastolic (congestive) heart failure: Secondary | ICD-10-CM | POA: Diagnosis not present

## 2016-05-28 DIAGNOSIS — Z9181 History of falling: Secondary | ICD-10-CM

## 2016-05-28 DIAGNOSIS — M6281 Muscle weakness (generalized): Secondary | ICD-10-CM | POA: Diagnosis not present

## 2016-05-28 DIAGNOSIS — Z794 Long term (current) use of insulin: Secondary | ICD-10-CM

## 2016-05-28 DIAGNOSIS — I10 Essential (primary) hypertension: Secondary | ICD-10-CM | POA: Diagnosis not present

## 2016-05-28 DIAGNOSIS — I2510 Atherosclerotic heart disease of native coronary artery without angina pectoris: Secondary | ICD-10-CM

## 2016-05-28 DIAGNOSIS — Z7902 Long term (current) use of antithrombotics/antiplatelets: Secondary | ICD-10-CM

## 2016-08-26 ENCOUNTER — Ambulatory Visit: Payer: Medicare Other | Admitting: Neurology

## 2016-09-02 ENCOUNTER — Encounter: Payer: Self-pay | Admitting: Neurology

## 2016-09-09 IMAGING — CR DG LUMBAR SPINE COMPLETE 4+V
5 series · 5 of 5 positions shown · non-contrast
Comparison: 02/20/2012 CT

CLINICAL DATA: Chronic low back pain with left sciatica pain. No
known injury.

EXAM:
LUMBAR SPINE - COMPLETE 4+ VIEW

[view not recorded (1 of 5)]
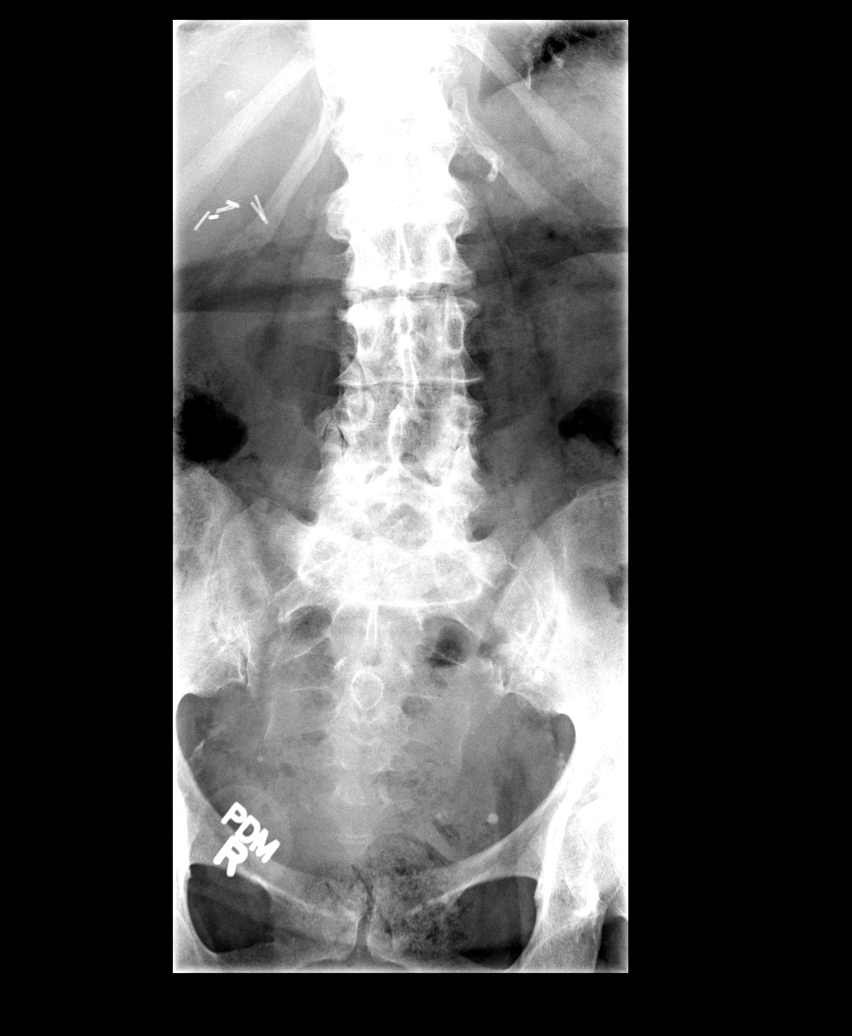

[view not recorded (2 of 5)]
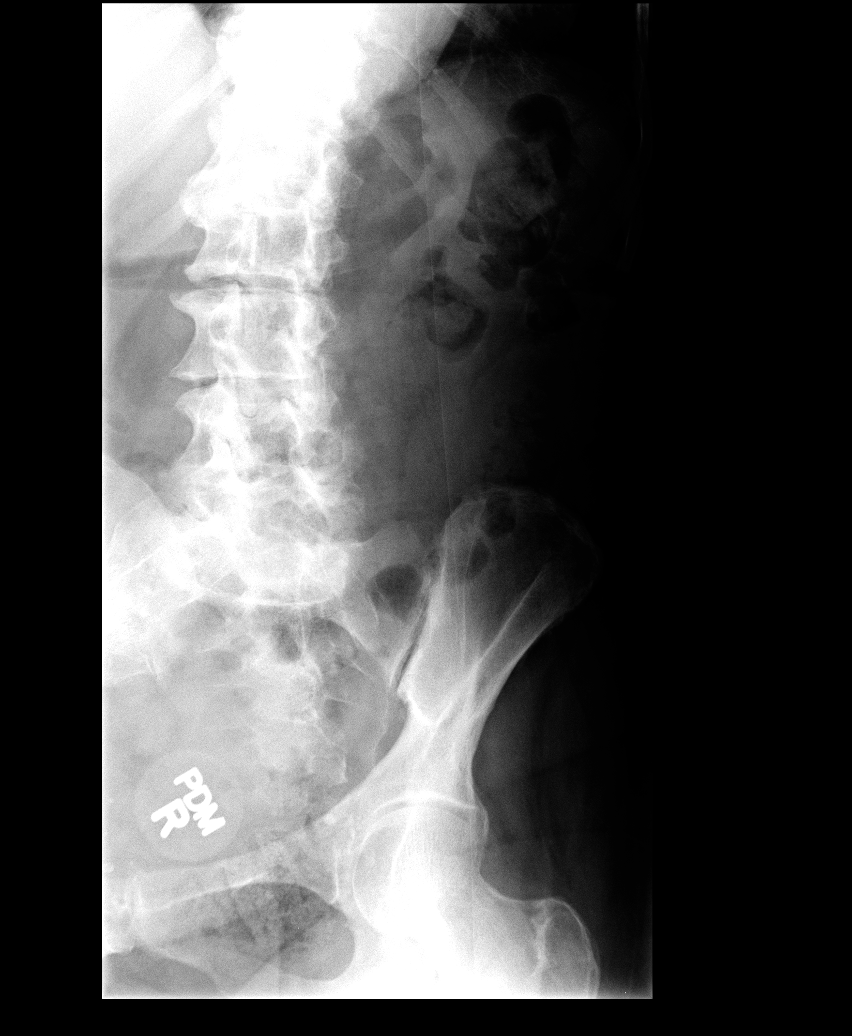

[view not recorded (3 of 5)]
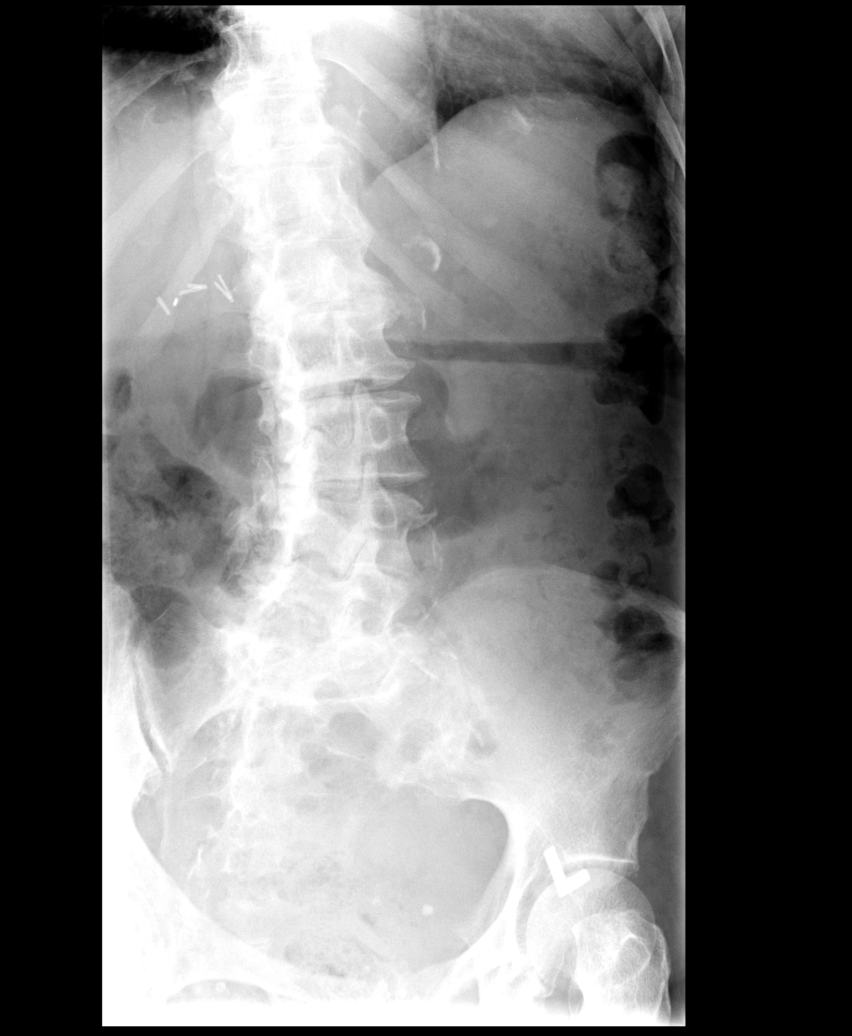

[view not recorded (4 of 5)]
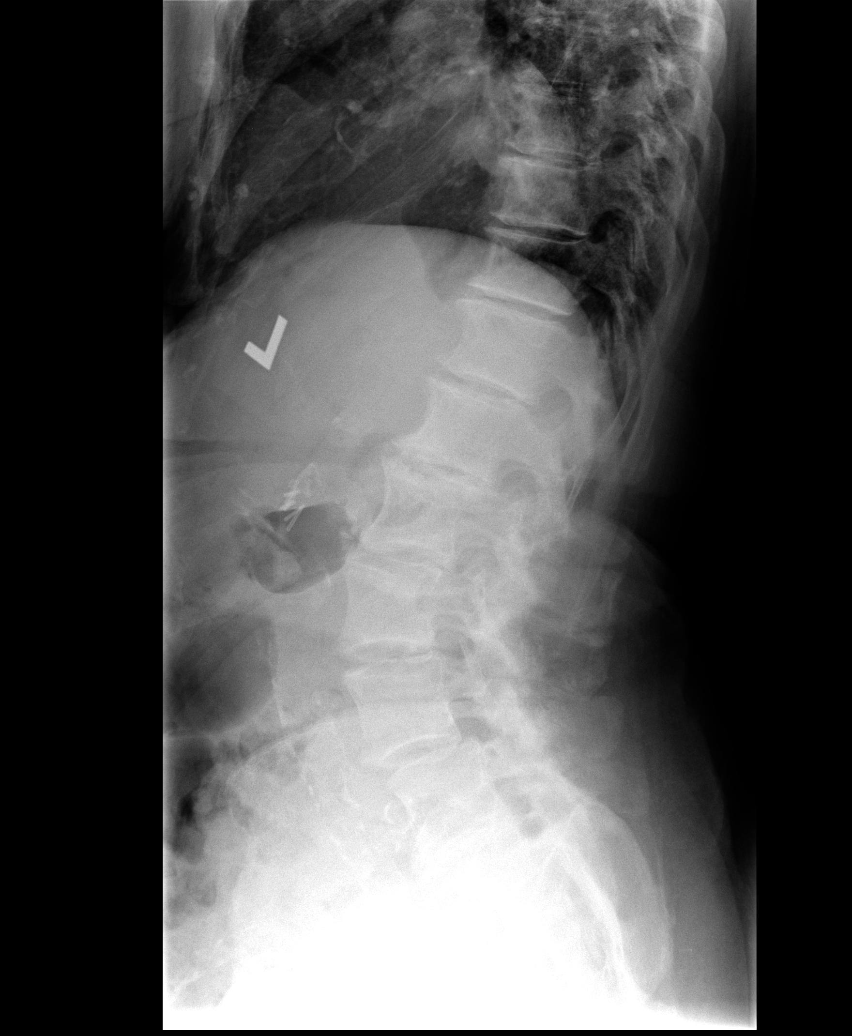

[view not recorded (5 of 5)]
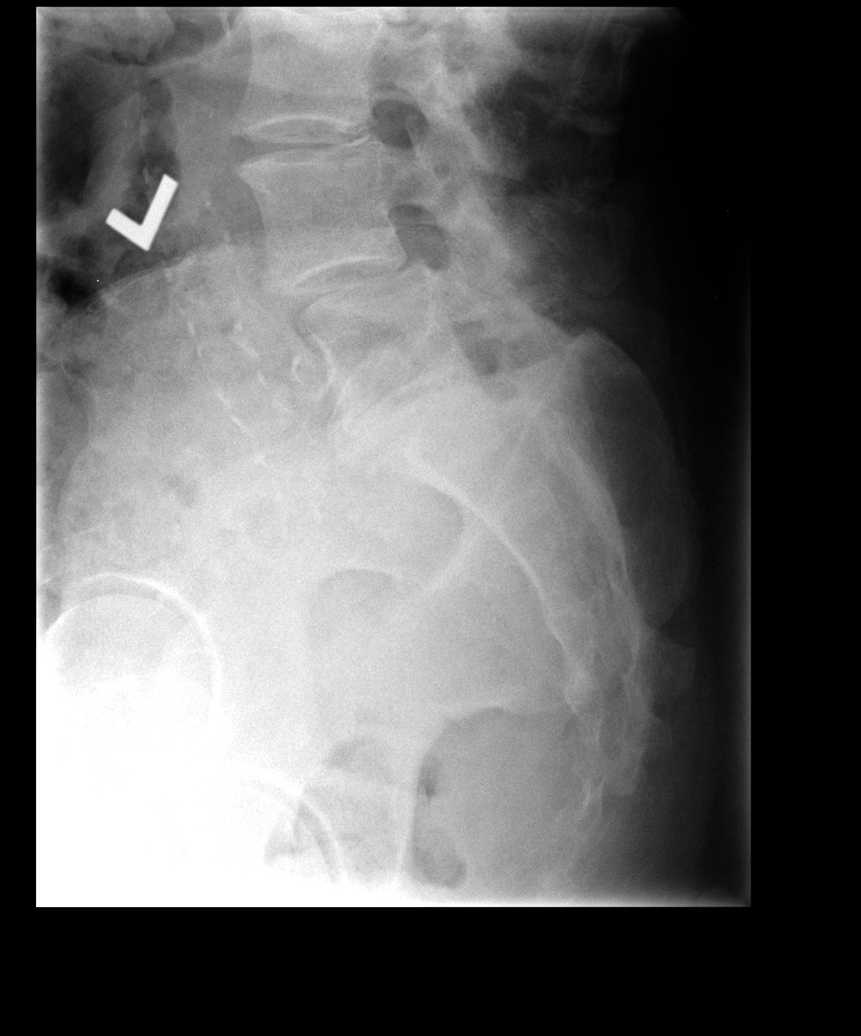

[5 of 5 positions shown; findings below may reference images not displayed]

FINDINGS: Diffuse degenerative disc disease and facet disease. Slight
anterolisthesis of L3 on L4 and L4 on L5, stable since prior CT,
likely related to facet disease. No fracture or acute subluxation.
SI joints are symmetric.
IMPRESSION: Diffuse degenerative disc and facet disease.  No acute findings.

## 2016-09-17 ENCOUNTER — Ambulatory Visit: Payer: Medicare Other | Admitting: Neurology

## 2016-11-15 ENCOUNTER — Ambulatory Visit: Payer: Medicare Other | Admitting: Neurology

## 2017-02-25 ENCOUNTER — Ambulatory Visit: Payer: Medicare Other | Admitting: Neurology
